# Patient Record
Sex: Male | Born: 1950 | Race: White | Hispanic: No | Marital: Married | Smoking: Current every day smoker
Health system: Southern US, Community
[De-identification: ages and names within clinical notes are randomized; demographics above are authoritative.]

## PROBLEM LIST (undated history)

## (undated) DIAGNOSIS — D126 Benign neoplasm of colon, unspecified: Secondary | ICD-10-CM

## (undated) DIAGNOSIS — J449 Chronic obstructive pulmonary disease, unspecified: Secondary | ICD-10-CM

## (undated) DIAGNOSIS — N529 Male erectile dysfunction, unspecified: Secondary | ICD-10-CM

## (undated) DIAGNOSIS — G459 Transient cerebral ischemic attack, unspecified: Secondary | ICD-10-CM

## (undated) DIAGNOSIS — R202 Paresthesia of skin: Secondary | ICD-10-CM

## (undated) DIAGNOSIS — L409 Psoriasis, unspecified: Secondary | ICD-10-CM

## (undated) DIAGNOSIS — R200 Anesthesia of skin: Secondary | ICD-10-CM

## (undated) DIAGNOSIS — I480 Paroxysmal atrial fibrillation: Secondary | ICD-10-CM

## (undated) HISTORY — DX: Benign neoplasm of colon, unspecified: D12.6

## (undated) HISTORY — DX: Anesthesia of skin: R20.2

## (undated) HISTORY — PX: A FLUTTER ABLATION: SHX5348

## (undated) HISTORY — DX: Paresthesia of skin: R20.0

## (undated) HISTORY — DX: Male erectile dysfunction, unspecified: N52.9

## (undated) HISTORY — PX: TOTAL HIP ARTHROPLASTY: SHX124

## (undated) HISTORY — PX: OTHER SURGICAL HISTORY: SHX169

## (undated) HISTORY — DX: Paroxysmal atrial fibrillation: I48.0

## (undated) HISTORY — DX: Transient cerebral ischemic attack, unspecified: G45.9

## (undated) HISTORY — PX: TONSILLECTOMY: SUR1361

## (undated) HISTORY — DX: Chronic obstructive pulmonary disease, unspecified: J44.9

## (undated) HISTORY — DX: Psoriasis, unspecified: L40.9

---

## 2002-07-13 DIAGNOSIS — G459 Transient cerebral ischemic attack, unspecified: Secondary | ICD-10-CM

## 2002-07-13 HISTORY — DX: Transient cerebral ischemic attack, unspecified: G45.9

## 2005-07-13 HISTORY — PX: TOTAL HIP ARTHROPLASTY: SHX124

## 2007-07-14 LAB — HM COLONOSCOPY

## 2010-06-04 ENCOUNTER — Ambulatory Visit: Payer: Self-pay | Admitting: Cardiology

## 2010-07-01 ENCOUNTER — Ambulatory Visit: Payer: Self-pay | Admitting: Cardiology

## 2010-09-30 ENCOUNTER — Other Ambulatory Visit: Payer: Self-pay | Admitting: *Deleted

## 2010-09-30 DIAGNOSIS — IMO0002 Reserved for concepts with insufficient information to code with codable children: Secondary | ICD-10-CM

## 2010-09-30 MED ORDER — FLECAINIDE ACETATE 50 MG PO TABS: 50.0000 mg | ORAL_TABLET | Freq: Every day | ORAL | Status: DC

## 2010-09-30 MED ORDER — FLECAINIDE ACETATE 50 MG PO TABS: 50.0000 mg | ORAL_TABLET | Freq: Two times a day (BID) | ORAL | Status: DC

## 2010-11-28 ENCOUNTER — Telehealth: Payer: Self-pay | Admitting: Cardiology

## 2010-11-28 NOTE — Telephone Encounter (Signed)
Pt would like RN to set up f/u with Dr. Johney Frame since Dr. Deborah Chalk is retiring.  RN set pt up for a recall for 09/12.  Pt notified of recall.

## 2010-11-28 NOTE — Telephone Encounter (Signed)
Wanted to talk about scheduling with Dr. Johney Frame, received a phone call yesterday asking him to call into our offices to schedule for September. Please call back.

## 2011-03-13 ENCOUNTER — Encounter: Payer: Self-pay | Admitting: Internal Medicine

## 2011-03-18 ENCOUNTER — Ambulatory Visit (INDEPENDENT_AMBULATORY_CARE_PROVIDER_SITE_OTHER): Payer: 59 | Admitting: Internal Medicine

## 2011-03-18 ENCOUNTER — Encounter: Payer: Self-pay | Admitting: Internal Medicine

## 2011-03-18 DIAGNOSIS — Z720 Tobacco use: Secondary | ICD-10-CM

## 2011-03-18 DIAGNOSIS — I4891 Unspecified atrial fibrillation: Secondary | ICD-10-CM

## 2011-03-18 DIAGNOSIS — F17200 Nicotine dependence, unspecified, uncomplicated: Secondary | ICD-10-CM

## 2011-03-18 NOTE — Assessment & Plan Note (Signed)
Cessation advised He is not ready to quit 

## 2011-03-18 NOTE — Patient Instructions (Signed)
Your physician wants you to follow-up in: 1 year with Dr. Allred. You will receive a reminder letter in the mail two months in advance. If you don't receive a letter, please call our office to schedule the follow-up appointment.  Your physician recommends that you continue on your current medications as directed. Please refer to the Current Medication list given to you today.  

## 2011-03-18 NOTE — Assessment & Plan Note (Signed)
He denies episodes of afib since his atrial flutter ablation His CHADSVASC score is 0.  He is now on ASA.  Today, we will stop flecainide.  ETOH avoidance encouraged

## 2011-03-18 NOTE — Progress Notes (Signed)
Lawrence Rivas is a pleasant 60 y.o. WM patient with a h/o atrial flutter and atrial fibrillation who presents today to establish care with me in the absence of Dr Deborah Chalk.  He reports having atrial flutter in 2011 for which he underwent atrial flutter ablation at [facility] [place].  He was felt to have atrial fibrillation resulting from degeneration of atrial flutter.  Following ablation, he has had no further afib.  He reports doing well at this time.  He remains active and walks 3-4 miles per day without difficulty.  Today, he denies symptoms of palpitations, chest pain, shortness of breath, orthopnea, PND, lower extremity edema, dizziness, presyncope, syncope, or neurologic sequela. The patient is tolerating medications without difficulties and is otherwise without complaint today.   Past Medical History  Diagnosis Date  . Paroxysmal a-fib     no recurrence s/p atrial flutter ablation (previously atrial flutter felt to degenerate into afib)  . Numbness and tingling     right arm   . Psoriasis   . Atrial flutter     s/p atrial flutter ablation at [facility] 3/11  . Erectile dysfunction    Past Surgical History  Procedure Date  . Total hip arthroplasty     right  . Tonsillectomy   . A flutter ablation     March 2011 at [facility] by Dr Ruben Reason Dixat    Current Outpatient Prescriptions  Medication Sig Dispense Refill  . aspirin 325 MG tablet Take 325 mg by mouth daily.        Marland Kitchen diltiazem (CARDIZEM CD) 120 MG 24 hr capsule 1 TAB TWICE A DAY      . flecainide (TAMBOCOR) 50 MG tablet Take 1 tablet (50 mg total) by mouth 2 (two) times daily.  60 tablet  6    No Known Allergies  History   Social History  . Marital Status: Married    Spouse Name: N/A    Number of Children: 20  . Years of Education: N/A   Occupational History  . assignment editor     [name]    Social History Main Topics  . Smoking status: Current Everyday Smoker -- 1.0  packs/day    Types: Cigarettes  . Smokeless tobacco: Not on file  . Alcohol Use: Yes     2 drinks of scotch per day  . Drug Use: No  . Sexually Active: Not on file   Other Topics Concern  . Not on file   Social History Narrative   Works as a Youth worker for [employer]    Family History  Problem Relation Age of Onset  . Coronary artery disease Father     ROS- All systems are reviewed and negative except as per the HPI above  Physical Exam: Filed Vitals:   03/18/11 1039  BP: 120/71  Pulse: 65  Height: 6' (1.829 m)  Weight: 196 lb (88.905 kg)    GEN- The patient is well appearing, alert and oriented x 3 today.   Head- normocephalic, atraumatic Eyes-  Sclera clear, conjunctiva pink Ears- hearing intact Oropharynx- clear Neck- supple, no JVP Lymph- no cervical lymphadenopathy Lungs- Clear to ausculation bilaterally, normal work of breathing Heart- Regular rate and rhythm, no murmurs, rubs or gallops, PMI not laterally displaced GI- soft, NT, ND, + BS Extremities- no clubbing, cyanosis, or edema MS- no significant deformity or atrophy Skin- + psoriatic rash on legs Psych- euthymic mood, full affect Neuro- strength and sensation are  intact  EKG today reveals sinus rhythm with PACs, RBBB  Assessment and Plan:

## 2011-06-18 ENCOUNTER — Ambulatory Visit (INDEPENDENT_AMBULATORY_CARE_PROVIDER_SITE_OTHER): Payer: 59 | Admitting: Internal Medicine

## 2011-06-18 ENCOUNTER — Encounter: Payer: Self-pay | Admitting: Internal Medicine

## 2011-06-18 DIAGNOSIS — Z23 Encounter for immunization: Secondary | ICD-10-CM

## 2011-06-18 DIAGNOSIS — Z0000 Encounter for general adult medical examination without abnormal findings: Secondary | ICD-10-CM

## 2011-06-18 DIAGNOSIS — N529 Male erectile dysfunction, unspecified: Secondary | ICD-10-CM | POA: Insufficient documentation

## 2011-06-18 LAB — CBC WITH DIFFERENTIAL/PLATELET
Eosinophils Absolute: 0.3 10*3/uL (ref 0.0–0.7)
HCT: 42.7 % (ref 39.0–52.0)
Lymphs Abs: 2 10*3/uL (ref 0.7–4.0)
MCHC: 34.6 g/dL (ref 30.0–36.0)
MCV: 100.8 fl — ABNORMAL HIGH (ref 78.0–100.0)
Monocytes Absolute: 0.5 10*3/uL (ref 0.1–1.0)
Neutrophils Relative %: 50.2 % (ref 43.0–77.0)
Platelets: 207 10*3/uL (ref 150.0–400.0)
RDW: 13.6 % (ref 11.5–14.6)

## 2011-06-18 LAB — COMPREHENSIVE METABOLIC PANEL
ALT: 22 U/L (ref 0–53)
AST: 24 U/L (ref 0–37)
Albumin: 3.9 g/dL (ref 3.5–5.2)
Alkaline Phosphatase: 51 U/L (ref 39–117)
Glucose, Bld: 84 mg/dL (ref 70–99)
Potassium: 4.9 mEq/L (ref 3.5–5.1)
Sodium: 138 mEq/L (ref 135–145)
Total Bilirubin: 0.5 mg/dL (ref 0.3–1.2)
Total Protein: 6.9 g/dL (ref 6.0–8.3)

## 2011-06-18 LAB — PSA: PSA: 0.21 ng/mL (ref 0.10–4.00)

## 2011-06-18 LAB — LIPID PANEL
Cholesterol: 194 mg/dL (ref 0–200)
LDL Cholesterol: 113 mg/dL — ABNORMAL HIGH (ref 0–99)
Triglycerides: 60 mg/dL (ref 0.0–149.0)

## 2011-06-18 NOTE — Assessment & Plan Note (Addendum)
Long h/o ED and decreased libido. Plan: Check a testosterone level viagra trial, s/e discussed , rec not  to use if he uses flecainide

## 2011-06-18 NOTE — Patient Instructions (Addendum)
Try viagra, use as needed  50 mg first , then 100 mg if needed----> if side effects stop using it and call immediately

## 2011-06-18 NOTE — Progress Notes (Signed)
  Subjective:    Patient ID: Lawrence Rivas, male    DOB: [birth year], 60 y.o.   MRN: [id number]  HPI New patient, needs a physical exam In general doing okay, reports a long history of erectile dysfunction in the last year it has gotten worse the unable to penetrate. Also his libido has decreased tremendously. Denies excessive fatigue, has noticed a loss of muscle mass but at the same time she's not exercising much. As far as a systolic atrial fibrillation, the patient reports that was prescribed to take flecainide only as needed.  Past medical history TIA 2004 Parox. 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  Respiratory: Negative for cough and shortness of breath.   Cardiovascular: Negative for chest pain and leg swelling.  Gastrointestinal: Negative for abdominal pain and blood in stool.  Genitourinary: Negative for dysuria, hematuria and difficulty urinating.  Psychiatric/Behavioral:       No anxiety-depression       Objective:   Physical Exam  Constitutional: He is oriented to person, place, and time. He appears well-developed and well-nourished. No distress.  HENT:  Head: Normocephalic and atraumatic.  Neck: No thyromegaly present.       Normal carotid pulse, no bruit  Cardiovascular: Normal rate, regular rhythm and normal heart sounds.   No murmur heard.      Normal femoral pulse B  Pulmonary/Chest: Effort normal and breath sounds normal. No respiratory distress. He has no wheezes. He  has no rales.  Abdominal: Soft. Bowel sounds are normal. He exhibits no distension. There is no tenderness. There is no rebound and no guarding.  Genitourinary:       Declined DRE  Musculoskeletal: He exhibits no edema.  Lymphadenopathy:    He has no cervical adenopathy.  Neurological: He is alert and oriented to person, place, and time.  Skin: He is not diaphoretic.  Psychiatric: He has a normal mood and affect. His behavior is normal. Judgment and thought content normal.      Assessment & Plan:

## 2011-06-18 NOTE — Assessment & Plan Note (Addendum)
Td > 20 years ago, gave one today Had a flu shot Pneumonia shot today (h/o arrhythmias) Info regards shingles shot provided   Reports a Cscope 2009, elesewhere, "benign polyps" Check a PSA Diet, exercise, tobacco quit -- discussed labs

## 2011-06-19 LAB — TESTOSTERONE, FREE, TOTAL, SHBG
Sex Hormone Binding: 54 nmol/L (ref 13–71)
Testosterone, Free: 75.6 pg/mL (ref 47.0–244.0)
Testosterone-% Free: 1.5 % — ABNORMAL LOW (ref 1.6–2.9)
Testosterone: 500.93 ng/dL (ref 250–890)

## 2011-06-22 ENCOUNTER — Encounter: Payer: Self-pay | Admitting: Internal Medicine

## 2011-06-22 LAB — FOLATE: Folate: 10.4 ng/mL (ref >5.9)

## 2011-06-22 LAB — VITAMIN B12: Vitamin B-12: 308 pg/mL (ref 211–911)

## 2011-09-03 ENCOUNTER — Other Ambulatory Visit: Payer: Self-pay | Admitting: *Deleted

## 2011-09-03 MED ORDER — DILTIAZEM HCL ER COATED BEADS 120 MG PO CP24: 120.0000 mg | ORAL_CAPSULE | Freq: Two times a day (BID) | ORAL | Status: DC

## 2012-03-28 ENCOUNTER — Encounter: Payer: Self-pay | Admitting: Internal Medicine

## 2012-03-28 ENCOUNTER — Ambulatory Visit (INDEPENDENT_AMBULATORY_CARE_PROVIDER_SITE_OTHER): Payer: 59 | Admitting: Internal Medicine

## 2012-03-28 VITALS — BP 114/62 | HR 75 | Ht 73.0 in | Wt 195.0 lb

## 2012-03-28 DIAGNOSIS — Z720 Tobacco use: Secondary | ICD-10-CM

## 2012-03-28 DIAGNOSIS — F17200 Nicotine dependence, unspecified, uncomplicated: Secondary | ICD-10-CM

## 2012-03-28 DIAGNOSIS — I4891 Unspecified atrial fibrillation: Secondary | ICD-10-CM

## 2012-03-28 MED ORDER — FLECAINIDE ACETATE 100 MG PO TABS: ORAL_TABLET | ORAL | Status: DC

## 2012-03-28 NOTE — Patient Instructions (Signed)
Your physician wants you to follow-up in: 12 months with Dr Jacquiline Doe will receive a reminder letter in the mail two months in advance. If you don't receive a letter, please call our office to schedule the follow-up appointment.   Your physician has recommended you make the following change in your medication:  1) Take Flecainide 100mg  ----take 2 tablets as needed for afib give it 12 hours to convert  If you stay in afib call me at [phone]

## 2012-03-28 NOTE — Assessment & Plan Note (Signed)
Cessation advised and discussed at length He is contemplating cessation  Lipids 12/12 reviewed No changes today No ischemic symptoms and he continues to walk 3-4 miles per day without difficulty or symptoms of PVD

## 2012-03-28 NOTE — Progress Notes (Signed)
PCP: Willow Ora, MD  Lawrence Rivas is a 61 y.o. male who presents today for routine electrophysiology followup.  Since last being seen in our clinic, the patient reports doing very well.  He denies any sustained arrhythmias. Today, he denies symptoms of palpitations, chest pain, shortness of breath,  lower extremity edema, dizziness, presyncope, or syncope.  The patient is otherwise without complaint today.   Past Medical History  Diagnosis Date  . Paroxysmal a-fib     no recurrence s/p atrial flutter ablation (previously atrial flutter felt to degenerate into afib)  . Numbness and tingling     right arm   . Psoriasis   . Atrial flutter     s/p atrial flutter ablation at [facility] [place] 3/11  . Erectile dysfunction    Past Surgical History  Procedure Date  . Total hip arthroplasty     right  . Tonsillectomy   . A flutter ablation     March 2011 at [facility] by Dr Ruben Reason Dixat    Current Outpatient Prescriptions  Medication Sig Dispense Refill  . aspirin 325 MG tablet Take 325 mg by mouth daily.        . clobetasol ointment (TEMOVATE) 0.05 % Apply 1 application topically 2 (two) times daily.        Marland Kitchen diltiazem (CARDIZEM CD) 120 MG 24 hr capsule Take 1 capsule (120 mg total) by mouth 2 (two) times daily. 1 TAB TWICE A DAY  120 capsule  6    Physical Exam: Filed Vitals:   03/28/12 1011  BP: 114/62  Pulse: 75  Height: 6\' 1"  (1.854 m)  Weight: 195 lb (88.451 kg)  SpO2: 97%    GEN- The patient is well appearing, alert and oriented x 3 today.   Head- normocephalic, atraumatic Eyes-  Sclera clear, conjunctiva pink Ears- hearing intact Oropharynx- clear Lungs- Clear to ausculation bilaterally, normal work of breathing Heart- Regular rate and rhythm, no murmurs, rubs or gallops, PMI not laterally displaced GI- soft, NT, ND, + BS Extremities- no clubbing, cyanosis, or edema  ekg today reveals sinus rhythm with short PR and RBBB  Assessment and  Plan:

## 2012-03-28 NOTE — Assessment & Plan Note (Signed)
Maintaining sinus rhythm off of AAD He will stop diltiazem today and take flecainide 200mg  prn as a "pill in the pocket approach" He should contact me if further afib occurs CHADS2 score is 0.  He is therefore not on anticoagulation

## 2012-10-24 ENCOUNTER — Ambulatory Visit (INDEPENDENT_AMBULATORY_CARE_PROVIDER_SITE_OTHER): Payer: BC Managed Care – PPO | Admitting: Internal Medicine

## 2012-10-24 VITALS — BP 128/84 | HR 74 | Temp 98.1°F | Wt 206.0 lb

## 2012-10-24 DIAGNOSIS — J309 Allergic rhinitis, unspecified: Secondary | ICD-10-CM

## 2012-10-24 DIAGNOSIS — I4891 Unspecified atrial fibrillation: Secondary | ICD-10-CM

## 2012-10-24 MED ORDER — FLUTICASONE PROPIONATE 50 MCG/ACT NA SUSP: 2.0000 | Freq: Every day | NASAL | Status: DC

## 2012-10-24 MED ORDER — HYDROCODONE-HOMATROPINE 5-1.5 MG/5ML PO SYRP: 5.0000 mL | ORAL_SOLUTION | Freq: Every evening | ORAL | Status: DC | PRN

## 2012-10-24 NOTE — Assessment & Plan Note (Signed)
Reports he is asymptomatic, off Cardizem. On flecanaide when necessary, has not taken in a while

## 2012-10-24 NOTE — Assessment & Plan Note (Addendum)
Sx likely d/t allergies, less likely an atypical infex , see instructions  If no better in few days, he will call ---> ? Z-Pak

## 2012-10-24 NOTE — Patient Instructions (Addendum)
For cough, take Mucinex DM twice a day as needed If cough at night, use hydrocodone as prescribed   For congestion use  flonase until you feel better Lawrence Rivas claritin OTC Call if no better in few days Call anytime if the symptoms are severe --- Schedule a physical at your convenience

## 2012-10-24 NOTE — Progress Notes (Signed)
  Subjective:    Patient ID: Lawrence Rivas, male    DOB: 1951-07-12, 62 y.o.   MRN: [id number]  HPI Acute visit 2 weeks ago, he was exposed to polen according to the patient, immediately developed a head congestion, nasal discharge, postnasal dripping. He started OTC and symptoms improved. A week ago he started with cough, some sore throat chest congestion. Cough is severe at night, can't sleep well.  Past Medical History  Diagnosis Date  . Paroxysmal a-fib     no recurrence s/p atrial flutter ablation (previously atrial flutter felt to degenerate into afib)  . Numbness and tingling     right arm   . Psoriasis   . Erectile dysfunction   . TIA (transient ischemic attack) 2004   Past Surgical History  Procedure Laterality Date  . Total hip arthroplasty      right  . Tonsillectomy    . 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 Occasionally produces white to clear sputum and nasal discharge. No fever chills. No wheezing.     Objective:   Physical Exam BP 128/84  Pulse 74  Temp(Src) 98.1 F (36.7 C) (Oral)  Wt 206 lb (93.441 kg)  BMI 27.18 kg/m2  SpO2 95%  General -- alert, well-developed, No apparent distress .   HEENT -- TMs normal, throat w/o redness, face symmetric and not tender to palpation, Nose is slightly congested Lungs -- normal respiratory effort, no intercostal retractions, no accessory muscle use, and normal breath sounds.   Heart-- normal rate, regular rhythm, no murmur, and no gallop.   Extremities-- no pretibial edema  bilaterally  Neurologic-- alert & oriented X3 and strength normal in all extremities. Psych-- Cognition and judgment appear intact. Alert and cooperative with normal attention span and concentration.  not anxious appearing and not depressed appearing.      Assessment & Plan:

## 2012-10-25 ENCOUNTER — Encounter: Payer: Self-pay | Admitting: Internal Medicine

## 2013-04-03 ENCOUNTER — Encounter: Payer: Self-pay | Admitting: Internal Medicine

## 2013-04-03 ENCOUNTER — Ambulatory Visit (INDEPENDENT_AMBULATORY_CARE_PROVIDER_SITE_OTHER): Payer: BC Managed Care – PPO | Admitting: Internal Medicine

## 2013-04-03 VITALS — BP 120/78 | HR 66 | Ht 73.0 in | Wt 200.0 lb

## 2013-04-03 DIAGNOSIS — I4891 Unspecified atrial fibrillation: Secondary | ICD-10-CM

## 2013-04-03 DIAGNOSIS — Z720 Tobacco use: Secondary | ICD-10-CM

## 2013-04-03 DIAGNOSIS — F17200 Nicotine dependence, unspecified, uncomplicated: Secondary | ICD-10-CM

## 2013-04-03 MED ORDER — DILTIAZEM HCL 30 MG PO TABS: ORAL_TABLET | ORAL | Status: DC

## 2013-04-03 MED ORDER — FLECAINIDE ACETATE 100 MG PO TABS: ORAL_TABLET | ORAL | Status: DC

## 2013-04-03 NOTE — Patient Instructions (Addendum)
Your physician wants you to follow-up in: 12 months with Dr Jacquiline Doe will receive a reminder letter in the mail two months in advance. If you don't receive a letter, please call our office to schedule the follow-up appointment.  Your physician has recommended you make the following change in your medication:  1) Start  Cardizem 30mg  take 1 every 6 hours as needed for palpitations

## 2013-04-03 NOTE — Progress Notes (Signed)
PCP: Willow Ora, MD  Lawrence Rivas is a 62 y.o. male who presents today for routine electrophysiology followup.  Since last being seen in our clinic, the patient reports doing very well.  We discussed his mothers recent death at length today.  They were very close.  He has had 3-4 episodes of chest "fluttering"/ uneasiness for which he has taken flecainide over the past year.  These episodes occurred at rest and resolved within 15 minutes.  He denies any exertional symptoms.  He remains active [place] Today, he denies symptoms of  chest pain, shortness of breath,  lower extremity edema, dizziness, presyncope, or syncope.  The patient is otherwise without complaint today.   Past Medical History  Diagnosis Date  . Paroxysmal a-fib     no recurrence s/p atrial flutter ablation (previously atrial flutter felt to degenerate into afib)  . Numbness and tingling     right arm   . Psoriasis   . Erectile dysfunction   . TIA (transient ischemic attack) 2004   Past Surgical History  Procedure Laterality Date  . Total hip arthroplasty      right  . Tonsillectomy    . A flutter ablation      March 2011 at [facility] by Dr Ruben Reason Dixat    Current Outpatient Prescriptions  Medication Sig Dispense Refill  . aspirin 325 MG tablet Take 325 mg by mouth daily.        . clobetasol ointment (TEMOVATE) 0.05 % Apply 1 application topically 2 (two) times daily.        . flecainide (TAMBOCOR) 100 MG tablet Take 2 tablets as needed for afib  30 tablet  1  . fluticasone (FLONASE) 50 MCG/ACT nasal spray Place 2 sprays into the nose daily.  16 g  6  . HYDROcodone-homatropine (HYCODAN) 5-1.5 MG/5ML syrup Take 5 mLs by mouth at bedtime as needed for cough.  120 mL  0   No current facility-administered medications for this visit.    Physical Exam: Filed Vitals:   04/03/13 1008  BP: 120/78  Pulse: 66  Height: 6\' 1"  (1.854 m)  Weight: 200 lb  (90.719 kg)    GEN- The patient is well appearing, alert and oriented x 3 today.   Head- normocephalic, atraumatic Eyes-  Sclera clear, conjunctiva pink Ears- hearing intact Oropharynx- clear Lungs- Clear to ausculation bilaterally, normal work of breathing Heart- Regular rate and rhythm, no murmurs, rubs or gallops, PMI not laterally displaced GI- soft, NT, ND, + BS Extremities- no clubbing, cyanosis, or edema  ekg today reveals sinus rhythm with RBBB  Assessment and Plan:  1. afib Well controlled I think I have encouraged him to come to either my office or Dr Drue Novel' office should he have any episode that he thinks may be afib to better characterize this.   His episodes are too infrequent for event monitor placement.  We discussed implantable loop recorder placement which he wants to avoid. Continue flecainide as needed (pill in pocket) As needed cardizem  Return in 1 year Smoking cessation again encouraged

## 2013-05-10 ENCOUNTER — Ambulatory Visit (INDEPENDENT_AMBULATORY_CARE_PROVIDER_SITE_OTHER): Payer: BC Managed Care – PPO | Admitting: Internal Medicine

## 2013-05-10 ENCOUNTER — Encounter: Payer: Self-pay | Admitting: Internal Medicine

## 2013-05-10 VITALS — BP 142/82 | HR 95 | Temp 98.4°F | Wt 198.0 lb

## 2013-05-10 DIAGNOSIS — J209 Acute bronchitis, unspecified: Secondary | ICD-10-CM

## 2013-05-10 MED ORDER — HYDROCODONE-HOMATROPINE 5-1.5 MG/5ML PO SYRP: 5.0000 mL | ORAL_SOLUTION | Freq: Every evening | ORAL | Status: DC | PRN

## 2013-05-10 MED ORDER — DOXYCYCLINE HYCLATE 100 MG PO TABS: 100.0000 mg | ORAL_TABLET | Freq: Two times a day (BID) | ORAL | Status: DC

## 2013-05-10 NOTE — Patient Instructions (Signed)
Rest, fluids , tylenol For cough, take Mucinex DM twice a day as needed  For persistent , nocturnal cough, use hydrocodone Take the antibiotic as prescribed  (doxycycline) Call if no better in few days Call anytime if the symptoms are severe --- Get a flu shot once are better Reschedule your physical for 2 or 3 weeks from today

## 2013-05-10 NOTE — Progress Notes (Signed)
  Subjective:    Patient ID: Lawrence Rivas, male    DOB: September 25, 1950, 62 y.o.   MRN: [id number]  HPI Acute visit Symptoms started 2 weeks ago with sore throat, head congestion, then developed cough often times productive, sometimes dry. Sputum and nasal discharge is dark greenish Last week he had fever twice, no fever this week. He is using A nasal spray and OTCs with mild help.  Past Medical History  Diagnosis Date  . Paroxysmal a-fib     no recurrence s/p atrial flutter ablation (previously atrial flutter felt to degenerate into afib)  . Numbness and tingling     right arm   . Psoriasis   . Erectile dysfunction   . TIA (transient ischemic attack) 2004   Past Surgical History  Procedure Laterality Date  . Total hip arthroplasty      right  . Tonsillectomy    . A flutter ablation      March 2011 at [facility] by Dr Ruben Reason Dixat   History   Social History  . Marital Status: Married    Spouse Name: N/A    Number of Children: 1  . Years of Education: N/A   Occupational History  . 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     Social History Main Topics  . Smoking status: Current Every Day Smoker -- 1.00 packs/day    Types: Cigarettes  . Smokeless tobacco: Not on file     Comment: he will try to quit  . Alcohol Use: Yes     Comment: 2 drinks of scotch per day  . Drug Use: No  . Sexual Activity: Not on file   Other Topics Concern  . Not on file   Social History Narrative   Works as a Youth worker for [employer]    Review of Systems No chest pain or shortness or breath, no hemoptysis but some wheezing. Still smoking but here lately has been smoking less. No recent trips outside [place].     Objective:   Physical Exam BP 142/82  Pulse 95  Temp(Src) 98.4 F (36.9 C)  Wt 198 lb (89.812 kg)  BMI 26.13 kg/m2  SpO2 96% General -- alert, well-developed, NAD.    HEENT-- Not pale. TM R nl, L Obscured by wax, throat symmetric, no redness or  discharge. Face symmetric, sinuses not tender to palpation. Nose quite  congested.  Lungs -- normal respiratory effort, no intercostal retractions, no accessory muscle use, and few rhonchi that clear with cough. Heart-- normal rate, regular rhythm, no murmur.   Extremities-- no pretibial edema bilaterally  Neurologic--  alert & oriented X3. Speech normal, gait normal, strength normal in all extremities.  Psych-- Cognition and judgment appear intact. Cooperative with normal attention span and concentration. No anxious appearing , no depressed appearing.      Assessment & Plan:  Bronchitis, Symptoms consistent with bronchitis, patient is nontoxic, had fever last week but no temperature today. Plan: see  instructions

## 2013-05-15 ENCOUNTER — Encounter: Payer: BC Managed Care – PPO | Admitting: Internal Medicine

## 2013-05-18 ENCOUNTER — Encounter: Payer: Self-pay | Admitting: Internal Medicine

## 2013-05-18 ENCOUNTER — Ambulatory Visit (INDEPENDENT_AMBULATORY_CARE_PROVIDER_SITE_OTHER): Payer: BC Managed Care – PPO | Admitting: Internal Medicine

## 2013-05-18 ENCOUNTER — Ambulatory Visit (HOSPITAL_BASED_OUTPATIENT_CLINIC_OR_DEPARTMENT_OTHER)
Admission: RE | Admit: 2013-05-18 | Discharge: 2013-05-18 | Disposition: A | Discharge status: Home / Self Care | Payer: BC Managed Care – PPO | Source: Ambulatory Visit | Attending: Internal Medicine | Admitting: Internal Medicine

## 2013-05-18 VITALS — BP 126/79 | HR 83 | Temp 98.6°F | Resp 14

## 2013-05-18 DIAGNOSIS — R059 Cough, unspecified: Secondary | ICD-10-CM | POA: Insufficient documentation

## 2013-05-18 DIAGNOSIS — R05 Cough: Secondary | ICD-10-CM | POA: Insufficient documentation

## 2013-05-18 DIAGNOSIS — J209 Acute bronchitis, unspecified: Secondary | ICD-10-CM

## 2013-05-18 DIAGNOSIS — R0789 Other chest pain: Secondary | ICD-10-CM

## 2013-05-18 DIAGNOSIS — R079 Chest pain, unspecified: Secondary | ICD-10-CM | POA: Insufficient documentation

## 2013-05-18 DIAGNOSIS — R0781 Pleurodynia: Secondary | ICD-10-CM

## 2013-05-18 DIAGNOSIS — R071 Chest pain on breathing: Secondary | ICD-10-CM

## 2013-05-18 MED ORDER — DOXYCYCLINE HYCLATE 100 MG PO TABS: 100.0000 mg | ORAL_TABLET | Freq: Two times a day (BID) | ORAL | Status: DC

## 2013-05-18 MED ORDER — PREDNISONE 20 MG PO TABS: 20.0000 mg | ORAL_TABLET | Freq: Two times a day (BID) | ORAL | Status: DC

## 2013-05-18 MED ORDER — TRAMADOL HCL 50 MG PO TABS: 50.0000 mg | ORAL_TABLET | Freq: Four times a day (QID) | ORAL | Status: DC | PRN

## 2013-05-18 NOTE — Patient Instructions (Addendum)
Additional studies are necessary  to optimally evaluate your symptoms ; please complete these @  [facility] , [address]  Provide support for the rib cage as we discussed if you are coughing

## 2013-05-18 NOTE — Progress Notes (Signed)
  Subjective:    Patient ID: Lawrence Rivas, male    DOB: 1950/12/07, 62 y.o.   MRN: [id number]  HPI   Symptoms began 3 weeks ago as a tickle in his throat associated with postnasal drainage and some sneezing. This progressed to chest congestion with the production of cloudy, thick sputum. This was associated with slight wheezing but no dyspnea.  10 days ago he had significant fever, chills, and profuse diaphoresis.  He was seen and placed on doxycycline and narcotic cough syrup with some improvement. He states he is actually 60-80% improved. He continues to have paroxysms of cough with sputum production.  Since 05/17/13 he's had sharp left inferior thoracic chest pain with deep breathing or coughing. No hemoptysis.  Typically does smoke a pack a day; he has decreased her tobacco consumption recently. He has no history of asthma. He denies a "smoker's cough ". He is not on ACE inhibitor.     Review of Systems  He is not having itchy, watery eyes. Postnasal drainage is not significant at this time.He has no rhinosinusitis symptoms of frontal headache, facial pain, nasal purulence, dental pain, sore throat, or otic pain, or otic discharge.  He denies any significant reflux symptoms.       Objective:   Physical Exam General appearance:good health ;well nourished; no acute distress or increased work of breathing is present.  No  lymphadenopathy about the head, neck, or axilla noted.   Eyes: No conjunctival inflammation or lid edema is present. There is no scleral icterus.  Ears:  External ear exam shows no significant lesions or deformities.  Otoscopic examination reveals wax on L ; R TM normal  Nose:  External nasal examination shows no deformity or inflammation. Nasal mucosa are pink and moist without lesions or exudates. No septal dislocation or deviation.No obstruction to airflow.   Oral exam: Dental hygiene is good; lips and gums are healthy appearing.There is no oropharyngeal  erythema or exudate noted. Tongue nicotine stained  Neck:  No deformities, masses, or tenderness noted.      Heart:  Normal rate and regular rhythm. S1 and S2 normal without gallop, murmur, click, rub or other extra sounds.   Lungs:Chest clear to auscultation; no wheezes, rhonchi,rales ,or rubs present.No increased work of breathing.  Tenderness to palpation left inferior thorax. There is no rub on the area pain. Chest is surprisingly clear. Paroxysmal cough productive of grayish thick sputum  Extremities:  No cyanosis, edema, or clubbing  noted . Homans sign negative bilaterally   Skin: Warm & dry          Assessment & Plan:  #1 protracted bronchitis with pleuritic chest pain. R/O rib fracture See orders

## 2013-06-02 ENCOUNTER — Telehealth: Payer: Self-pay

## 2013-06-02 NOTE — Telephone Encounter (Addendum)
LM with spouse  Medication List and allergies: reviewed and updated  90 day supply/mail order: na Local prescriptions: [facility] [address]  Immunizations due: flu and shingles  A/P:   No changes to FH or PSH PSA--06/2011--0.21 Health Maintenance  Topic Date Due  . Zostavax  04/30/2010  . Influenza Vaccine  02/10/2013  . Colonoscopy  07/13/2017  . Tetanus/tdap  06/17/2021   To Discuss with Provider: Vaccines due flu/shingles/PNA Still has lingering effects of bronchitis (mucus, doesn't feel well)

## 2013-06-05 ENCOUNTER — Ambulatory Visit (INDEPENDENT_AMBULATORY_CARE_PROVIDER_SITE_OTHER): Payer: BC Managed Care – PPO | Admitting: Internal Medicine

## 2013-06-05 ENCOUNTER — Encounter: Payer: Self-pay | Admitting: Internal Medicine

## 2013-06-05 VITALS — BP 158/84 | HR 75 | Temp 98.3°F | Ht 73.0 in | Wt 199.0 lb

## 2013-06-05 DIAGNOSIS — N529 Male erectile dysfunction, unspecified: Secondary | ICD-10-CM

## 2013-06-05 DIAGNOSIS — Z23 Encounter for immunization: Secondary | ICD-10-CM

## 2013-06-05 DIAGNOSIS — Z2911 Encounter for prophylactic immunotherapy for respiratory syncytial virus (RSV): Secondary | ICD-10-CM

## 2013-06-05 DIAGNOSIS — Z0000 Encounter for general adult medical examination without abnormal findings: Secondary | ICD-10-CM

## 2013-06-05 DIAGNOSIS — Z720 Tobacco use: Secondary | ICD-10-CM

## 2013-06-05 LAB — COMPREHENSIVE METABOLIC PANEL
ALT: 27 U/L (ref 0–53)
AST: 25 U/L (ref 0–37)
Alkaline Phosphatase: 55 U/L (ref 39–117)
CO2: 29 mEq/L (ref 19–32)
Creatinine, Ser: 0.7 mg/dL (ref 0.4–1.5)
GFR: 123.05 mL/min (ref >60.00)
Total Bilirubin: 0.6 mg/dL (ref 0.3–1.2)
Total Protein: 6.7 g/dL (ref 6.0–8.3)

## 2013-06-05 LAB — CBC WITH DIFFERENTIAL/PLATELET
Basophils Absolute: 0 10*3/uL (ref 0.0–0.1)
Basophils Relative: 0.8 % (ref 0.0–3.0)
Eosinophils Absolute: 0.1 10*3/uL (ref 0.0–0.7)
HCT: 43.8 % (ref 39.0–52.0)
Hemoglobin: 14.8 g/dL (ref 13.0–17.0)
Lymphs Abs: 1.8 10*3/uL (ref 0.7–4.0)
MCHC: 33.9 g/dL (ref 30.0–36.0)
MCV: 99.3 fl (ref 78.0–100.0)
Monocytes Absolute: 0.6 10*3/uL (ref 0.1–1.0)
Monocytes Relative: 9.9 % (ref 3.0–12.0)
Neutro Abs: 3.3 10*3/uL (ref 1.4–7.7)
Platelets: 204 10*3/uL (ref 150.0–400.0)
RBC: 4.41 Mil/uL (ref 4.22–5.81)
RDW: 14.3 % (ref 11.5–14.6)

## 2013-06-05 LAB — LIPID PANEL
HDL: 74.4 mg/dL (ref >39.00)
Total CHOL/HDL Ratio: 2
VLDL: 10.6 mg/dL (ref 0.0–40.0)

## 2013-06-05 LAB — TSH: TSH: 1.61 u[IU]/mL (ref 0.35–5.50)

## 2013-06-05 LAB — PSA: PSA: 0.35 ng/mL (ref 0.10–4.00)

## 2013-06-05 MED ORDER — TADALAFIL 20 MG PO TABS: 10.0000 mg | ORAL_TABLET | ORAL | Status: DC | PRN

## 2013-06-05 NOTE — Assessment & Plan Note (Signed)
Td 2012 flu shot today Pneumonia shot 2012 shingles shot today  Reports a Cscope 2009, elesewhere, "benign polyps", next? Was done in [place], rec to get records, iFOB for now  Check a PSA Diet, exercise and tobacco quit -- discussed labs Bp slt elevated today but usually ok

## 2013-06-05 NOTE — Assessment & Plan Note (Addendum)
Request samples, tried viagra before w/o s/e but it didn't help much Plan-- samples of cialis 20, #3 tabs Watch for s/e Living on vacation soon, I rec to take if before his  trip to be sure he does not have s/e from it

## 2013-06-05 NOTE — Progress Notes (Signed)
Subjective:    Patient ID: Lawrence Rivas, male    DOB: [birth year], 62 y.o.   MRN: [id number]  HPI CPX  Past Medical History  Diagnosis Date  . Paroxysmal a-fib     no recurrence s/p atrial flutter ablation (previously atrial flutter felt to degenerate into afib)  . Numbness and tingling     right arm   . Psoriasis   . Erectile dysfunction   . TIA (transient ischemic attack) 2004   Past Surgical History  Procedure Laterality Date  . Total hip arthroplasty      right  . Tonsillectomy    . A flutter ablation      March 2011 at [facility] by Dr Ruben Reason Dixat   History   Social History  . Marital Status: Married    Spouse Name: N/A    Number of Children: 1  . Years of Education: N/A   Occupational History  . 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     Social History Main Topics  . Smoking status: Current Every Day Smoker -- 1.00 packs/day    Types: Cigarettes  . Smokeless tobacco: Never Used     Comment: he will try to quit  . Alcohol Use: Yes     Comment: 2 drinks of scotch per day  . Drug Use: No  . Sexual Activity: Not on file   Other Topics Concern  . Not on file   Social History Narrative       Family History  Problem Relation Age of Onset  . Coronary artery disease Father     dx age 20, deceased  . Diabetes Other     GM  . Colon cancer Neg Hx   . Prostate cancer Neg Hx   . Stroke Mother     in her 73-90s    Review of Systems Diet-- healthy got the most part Exercise-- walks weather permits No  CP, SOB, lower extremity edema Denies  nausea, vomiting diarrhea Denies  blood in the stools No GERD  Sx. No dysphagia or odynophagia Recovering from bronchitis, + cough in AM w/ some sputum production (-)hemoptysis No dysuria, gross hematuria, difficulty urinating   No anxiety, depression     Objective:   Physical Exam BP 158/84  Pulse 75  Temp(Src) 98.3 F (36.8 C)  Ht 6\' 1"  (1.854 m)  Wt 199 lb (90.266 kg)  BMI 26.26 kg/m2   SpO2 97% General -- alert, well-developed, NAD.  Neck --no thyromegaly , no LAD  Lungs -- normal respiratory effort, no intercostal retractions, no accessory muscle use, and normal breath sounds.  Heart-- normal rate, regular rhythm, no murmur.  Abdomen-- Not distended, good bowel sounds,soft, non-tender. Rectal-- No external abnormalities noted. Normal sphincter tone. No rectal masses or tenderness. No stool found Prostate--Prostate gland firm and smooth, no enlargement, nodularity, tenderness, mass, asymmetry or induration. Extremities-- no pretibial edema bilaterally  Neurologic--  alert & oriented X3. Speech normal, gait normal, strength normal in all extremities.  Psych-- Cognition and judgment appear intact. Cooperative with normal attention span and concentration. No anxious appearing , no depressed appearing.     Assessment & Plan:   addendum-- ~ 1 year ago noted a "bump" at the forehead, no sx; exam showed a soft , flat 2x1 cm mass , mobile , not attached to deeper structures at the L forehead, likely a lipoma . Told pt only way to be 100% sure that is a lipoma  is by excision, we elected to wait and see, if area changes or keep growing to let me know

## 2013-06-05 NOTE — Progress Notes (Signed)
Pre visit review using our clinic review tool, if applicable. No additional management support is needed unless otherwise documented below in the visit note. 

## 2013-06-05 NOTE — Assessment & Plan Note (Signed)
Quit discussed, rec to see dentist for oral cancer screeningn

## 2013-06-05 NOTE — Patient Instructions (Signed)
Get your blood work before you leave  Next visit in 1 year for a physical exam. Fasting Please make an appointment    If you need more information about quitting tobacco, the American Heart Association is a great resource online at:  Mormon101.pl   Check the  blood pressure monthly be sure it is between 110/60 and 140/85. Ideal blood pressure is 120/80. If it is consistently higher or lower, let me know   Smoking Cessation Quitting smoking is important to your health and has many advantages. However, it is not always easy to quit since nicotine is a very addictive drug. Often times, people try 3 times or more before being able to quit. This document explains the best ways for you to prepare to quit smoking. Quitting takes hard work and a lot of effort, but you can do it. ADVANTAGES OF QUITTING SMOKING  You will live longer, feel better, and live better.  Your body will feel the impact of quitting smoking almost immediately.  Within 20 minutes, blood pressure decreases. Your pulse returns to its normal level.  After 8 hours, carbon monoxide levels in the blood return to normal. Your oxygen level increases.  After 24 hours, the chance of having a heart attack starts to decrease. Your breath, hair, and body stop smelling like smoke.  After 48 hours, damaged nerve endings begin to recover. Your sense of taste and smell improve.  After 72 hours, the body is virtually free of nicotine. Your bronchial tubes relax and breathing becomes easier.  After 2 to 12 weeks, lungs can hold more air. Exercise becomes easier and circulation improves.  The risk of having a heart attack, stroke, cancer, or lung disease is greatly reduced.  After 1 year, the risk of coronary heart disease is cut in half.  After 5 years, the risk of stroke falls to the same as a nonsmoker.  After 10 years, the risk of lung cancer is cut in half and the risk of other cancers decreases  significantly.  After 15 years, the risk of coronary heart disease drops, usually to the level of a nonsmoker.  If you are pregnant, quitting smoking will improve your chances of having a healthy baby.  The people you live with, especially any children, will be healthier.  You will have extra money to spend on things other than cigarettes. QUESTIONS TO THINK ABOUT BEFORE ATTEMPTING TO QUIT You may want to talk about your answers with your caregiver.  Why do you want to quit?  If you tried to quit in the past, what helped and what did not?  What will be the most difficult situations for you after you quit? How will you plan to handle them?  Who can help you through the tough times? Your family? Friends? A caregiver?  What pleasures do you get from smoking? What ways can you still get pleasure if you quit? Here are some questions to ask your caregiver:  How can you help me to be successful at quitting?  What medicine do you think would be best for me and how should I take it?  What should I do if I need more help?  What is smoking withdrawal like? How can I get information on withdrawal? GET READY  Set a quit date.  Change your environment by getting rid of all cigarettes, ashtrays, matches, and lighters in your home, car, or work. Do not let people smoke in your home.  Review your past attempts to quit. Think  about what worked and what did not. GET SUPPORT AND ENCOURAGEMENT You have a better chance of being successful if you have help. You can get support in many ways.  Tell your family, friends, and co-workers that you are going to quit and need their support. Ask them not to smoke around you.  Get individual, group, or telephone counseling and support. Programs are available at [employer] and health centers. Call your local health department for information about programs in your area.  Spiritual beliefs and practices may help some smokers quit.  Download a "quit  meter" on your computer to keep track of quit statistics, such as how long you have gone without smoking, cigarettes not smoked, and money saved.  Get a self-help book about quitting smoking and staying off of tobacco. LEARN NEW SKILLS AND BEHAVIORS  Distract yourself from urges to smoke. Talk to someone, go for a walk, or occupy your time with a task.  Change your normal routine. Take a different route to work. Drink tea instead of coffee. Eat breakfast in a different place.  Reduce your stress. Take a hot bath, exercise, or read a book.  Plan something enjoyable to do every day. Reward yourself for not smoking.  Explore interactive web-based programs that specialize in helping you quit. GET MEDICINE AND USE IT CORRECTLY Medicines can help you stop smoking and decrease the urge to smoke. Combining medicine with the above behavioral methods and support can greatly increase your chances of successfully quitting smoking.  Nicotine replacement therapy helps deliver nicotine to your body without the negative effects and risks of smoking. Nicotine replacement therapy includes nicotine gum, lozenges, inhalers, nasal sprays, and skin patches. Some may be available over-the-counter and others require a prescription.  Antidepressant medicine helps people abstain from smoking, but how this works is unknown. This medicine is available by prescription.  Nicotinic receptor partial agonist medicine simulates the effect of nicotine in your brain. This medicine is available by prescription. Ask your caregiver for advice about which medicines to use and how to use them based on your health history. Your caregiver will tell you what side effects to look out for if you choose to be on a medicine or therapy. Carefully read the information on the package. Do not use any other product containing nicotine while using a nicotine replacement product.  RELAPSE OR DIFFICULT SITUATIONS Most relapses occur within the  first 3 months after quitting. Do not be discouraged if you start smoking again. Remember, most people try several times before finally quitting. You may have symptoms of withdrawal because your body is used to nicotine. You may crave cigarettes, be irritable, feel very hungry, cough often, get headaches, or have difficulty concentrating. The withdrawal symptoms are only temporary. They are strongest when you first quit, but they will go away within 10 14 days. To reduce the chances of relapse, try to:  Avoid drinking alcohol. Drinking lowers your chances of successfully quitting.  Reduce the amount of caffeine you consume. Once you quit smoking, the amount of caffeine in your body increases and can give you symptoms, such as a rapid heartbeat, sweating, and anxiety.  Avoid smokers because they can make you want to smoke.  Do not let weight gain distract you. Many smokers will gain weight when they quit, usually less than 10 pounds. Eat a healthy diet and stay active. You can always lose the weight gained after you quit.  Find ways to improve your mood other than smoking. FOR  MORE INFORMATION  www.smokefree.gov  Document Released: 06/23/2001 Document Revised: 12/29/2011 Document Reviewed: 10/08/2011 [facility] Patient Information 2014 [place], [place].

## 2013-07-03 ENCOUNTER — Other Ambulatory Visit: Payer: BC Managed Care – PPO

## 2013-07-04 ENCOUNTER — Other Ambulatory Visit: Payer: BC Managed Care – PPO

## 2013-07-04 LAB — FECAL OCCULT BLOOD, IMMUNOCHEMICAL: Fecal Occult Bld: NEGATIVE

## 2013-07-04 NOTE — Addendum Note (Signed)
Addended by: Silvio Pate D on: 07/04/2013 11:48 AM   Modules accepted: Orders

## 2013-09-05 ENCOUNTER — Ambulatory Visit (INDEPENDENT_AMBULATORY_CARE_PROVIDER_SITE_OTHER): Payer: BC Managed Care – PPO | Admitting: Internal Medicine

## 2013-09-05 ENCOUNTER — Encounter: Payer: Self-pay | Admitting: Internal Medicine

## 2013-09-05 VITALS — BP 122/76 | HR 104 | Temp 99.8°F | Wt 199.0 lb

## 2013-09-05 DIAGNOSIS — J09X2 Influenza due to identified novel influenza A virus with other respiratory manifestations: Secondary | ICD-10-CM

## 2013-09-05 DIAGNOSIS — J111 Influenza due to unidentified influenza virus with other respiratory manifestations: Secondary | ICD-10-CM

## 2013-09-05 LAB — POCT INFLUENZA A/B
INFLUENZA B, POC: POSITIVE
Influenza A, POC: POSITIVE

## 2013-09-05 MED ORDER — OSELTAMIVIR PHOSPHATE 75 MG PO CAPS: 75.0000 mg | ORAL_CAPSULE | Freq: Two times a day (BID) | ORAL | Status: DC

## 2013-09-05 NOTE — Progress Notes (Signed)
Pre-visit discussion using our clinic review tool. No additional management support is needed unless otherwise documented below in the visit note.  

## 2013-09-05 NOTE — Progress Notes (Signed)
Subjective:    Patient ID: Lawrence Rivas, male    DOB: Dec 18, 1950, 63 y.o.   MRN: [id number]  DOS:  09/05/2013 Acute visit,  Symptoms started 2.5 days ago: Aches, chills, fever (not measured), feeling weak in general . Some cough. Denies foreign trips recently  or sick contacts to his knowledge.  Not taking any meds for sx . Sx not getting better    ROS No  Rash  No  CP, SOB  Denies  nausea, vomiting diarrhea No  abdominal pain No sputum production, (-) wheezing, chest congestion + headaches     Past Medical History  Diagnosis Date  . Paroxysmal a-fib     no recurrence s/p atrial flutter ablation (previously atrial flutter felt to degenerate into afib)  . Numbness and tingling     right arm   . Psoriasis   . Erectile dysfunction   . TIA (transient ischemic attack) 2004    Past Surgical History  Procedure Laterality Date  . Total hip arthroplasty      right  . Tonsillectomy    . A flutter ablation      March 2011 at [facility] by Dr Lyanne Co Dixat    History   Social History  . Marital Status: Married    Spouse Name: N/A    Number of Children: 1  . Years of Education: N/A   Occupational History  . 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     Social History Main Topics  . Smoking status: Current Every Day Smoker -- 1.00 packs/day    Types: Cigarettes  . Smokeless tobacco: Never Used     Comment: he will try to quit  . Alcohol Use: Yes     Comment: 2 drinks of scotch per day  . Drug Use: No  . Sexual Activity: Not on file   Other Topics Concern  . Not on file   Social History Narrative            Medication List       This list is accurate as of: 09/05/13 11:59 PM.  Always use your most recent med list.               aspirin 325 MG tablet  Take 325 mg by mouth daily.     diltiazem 30 MG tablet  Commonly known as:  CARDIZEM  30 mg 2 (two) times daily as needed.     flecainide 100 MG tablet  Commonly known as:  TAMBOCOR    Take 2 tablets as needed for afib     fluticasone 50 MCG/ACT nasal spray  Commonly known as:  FLONASE  Place 2 sprays into the nose daily.     oseltamivir 75 MG capsule  Commonly known as:  TAMIFLU  Take 1 capsule (75 mg total) by mouth 2 (two) times daily.     tadalafil 20 MG tablet  Commonly known as:  CIALIS  Take 0.5-1 tablets (10-20 mg total) by mouth every other day as needed for erectile dysfunction.           Objective:   Physical Exam BP 122/76  Pulse 104  Temp(Src) 99.8 F (37.7 C) (Oral)  Wt 199 lb (90.266 kg)  SpO2 95% General -- alert, well-developed, Not toxic or acutely ill appearing.  HEENT-- TMs normal, throat symmetric, slt redness , no discharge. Face symmetric, sinuses not tender to palpation. Nose  congested. Lungs -- normal respiratory effort,  no intercostal retractions, no accessory muscle use, and normal breath sounds.  Heart-- normal rate, regular rhythm, no murmur.   Extremities-- no pretibial edema bilaterally  Neurologic--  alert & oriented X3. Speech normal, gait normal, strength normal in all extremities.  Psych-- Cognition and judgment appear intact. Cooperative with normal attention span and concentration. No anxious or depressed appearing.      Assessment & Plan:   Influenza New problem. Flu test +. Patient aware of the diagnoses, knows this is not a cold and he couldt get worse, complications include pneumonia,  bacterial sinusitis, etc . Plan: See instructions

## 2013-09-05 NOTE — Patient Instructions (Signed)
Rest, fluids , tylenol For cough, take Mucinex DM twice a day as needed   For pain -fever alternate tylenol, motrin: Tylenol  500 mg OTC 2 tabs a day every 8 hours as needed  Motrin 200 mg 2 tablets every 6 hours as needed. Always take it with food because may cause gastritis and ulcers. If you notice nausea, stomach pain, change in the color of stools --->  Stop the medicine and let us know  Take Tamiflu ASAP Call if no better in few days Call anytime if the symptoms are severe, you have high fever, short of breath, chest pain

## 2013-09-06 ENCOUNTER — Telehealth: Payer: Self-pay | Admitting: Internal Medicine

## 2013-09-06 NOTE — Telephone Encounter (Signed)
Relevant patient education mailed to patient.  

## 2013-12-29 ENCOUNTER — Encounter: Payer: Self-pay | Admitting: Internal Medicine

## 2013-12-29 ENCOUNTER — Ambulatory Visit (INDEPENDENT_AMBULATORY_CARE_PROVIDER_SITE_OTHER): Payer: BC Managed Care – PPO | Admitting: Internal Medicine

## 2013-12-29 VITALS — BP 133/73 | HR 75 | Temp 98.5°F | Wt 203.0 lb

## 2013-12-29 DIAGNOSIS — R109 Unspecified abdominal pain: Secondary | ICD-10-CM

## 2013-12-29 DIAGNOSIS — R1031 Right lower quadrant pain: Secondary | ICD-10-CM

## 2013-12-29 DIAGNOSIS — B369 Superficial mycosis, unspecified: Secondary | ICD-10-CM

## 2013-12-29 DIAGNOSIS — Z0000 Encounter for general adult medical examination without abnormal findings: Secondary | ICD-10-CM

## 2013-12-29 MED ORDER — KETOCONAZOLE 2 % EX CREA: 1.0000 "application " | TOPICAL_CREAM | Freq: Two times a day (BID) | CUTANEOUS | Status: DC

## 2013-12-29 NOTE — Assessment & Plan Note (Signed)
Wonders about the need to do a AAA screening, he is a smoker. We agreed to discuss that during his upcoming physical

## 2013-12-29 NOTE — Patient Instructions (Signed)
Mix  ketoconazole with OTC hydrocortisone cream 1%  (50-50) Apply to the  right foot twice a day for at least 3 weeks  If the pain at the right groin continue or if you ever see a bulging area there ---> please let me know.  Next visit by November 2015 for your physical

## 2013-12-29 NOTE — Progress Notes (Signed)
Pre-visit discussion using our clinic review tool. No additional management support is needed unless otherwise documented below in the visit note.  

## 2013-12-29 NOTE — Progress Notes (Signed)
Subjective:    Patient ID: Lawrence Rivas, male    DOB: [birth year], 63 y.o.   MRN: [id number]  DOS:  12/29/2013 Type of  Visit: acute History: Rash at the right plantar area for 3 weeks, blisters initially?. Currently hurt and itch. Hasn't seen a very small amount of bleeding due to scratching. Denies any purulent discharge or swelling.  Also 5-6 weeks history of "groin pain" (is actually @ the right suprapubic area) Denies any bulging or swelling. No nausea, vomiting, diarrhea. Worse with walking.   ROS See history of present illness  Past Medical History  Diagnosis Date  . Paroxysmal a-fib     no recurrence s/p atrial flutter ablation (previously atrial flutter felt to degenerate into afib)  . Numbness and tingling     right arm   . Psoriasis   . Erectile dysfunction   . TIA (transient ischemic attack) 2004    Past Surgical History  Procedure Laterality Date  . Total hip arthroplasty      right  . Tonsillectomy    . A flutter ablation      March 2011 at [facility] by Dr Lyanne Co Dixat    History   Social History  . Marital Status: Married    Spouse Name: N/A    Number of Children: 1  . Years of Education: N/A   Occupational History  . 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     Social History Main Topics  . Smoking status: Current Every Day Smoker -- 1.00 packs/day    Types: Cigarettes  . Smokeless tobacco: Never Used     Comment: he will try to quit  . Alcohol Use: Yes     Comment: 2 drinks of scotch per day  . Drug Use: No  . Sexual Activity: Not on file   Other Topics Concern  . Not on file   Social History Narrative            Medication List       This list is accurate as of: 12/29/13 11:59 PM.  Always use your most recent med list.               aspirin 325 MG tablet  Take 325 mg by mouth daily.     diltiazem 30 MG tablet  Commonly known as:  CARDIZEM  30 mg 2 (two) times daily as needed.     flecainide 100 MG  tablet  Commonly known as:  TAMBOCOR  Take 2 tablets as needed for afib     fluticasone 50 MCG/ACT nasal spray  Commonly known as:  FLONASE  Place 2 sprays into the nose daily.     ketoconazole 2 % cream  Commonly known as:  NIZORAL  Apply 1 application topically 2 (two) times daily.     tadalafil 20 MG tablet  Commonly known as:  CIALIS  Take 0.5-1 tablets (10-20 mg total) by mouth every other day as needed for erectile dysfunction.           Objective:   Physical Exam  Genitourinary:      BP 133/73  Pulse 75  Temp(Src) 98.5 F (36.9 C) (Oral)  Wt 203 lb (92.08 kg)  SpO2 97%  General -- alert, well-developed, NAD.   Abdomen--  Groins without any mass or lymphadenopathy. Right suprapubic area not tender to palpation, no mass. GU-- Scrotal contents normal, penis normal. Extremities-- no pretibial edema bilaterally ; Has  a 3 cm round patch of scaliness-redness and dryness at the right plantar Psych-- Cognition and judgment appear intact. Cooperative with normal attention span and concentration. No anxious or depressed appearing.       Assessment & Plan:   Fungal dermatitis, Most likely has a fungal dermatitis at the right foot, prescribe ketoconazole and hydrocortisone  Right suprapubic area, At this time I don't see evidence of a hernia, recommend observation. If no better will consider a surgical referral or CT.

## 2014-02-14 ENCOUNTER — Encounter: Payer: Self-pay | Admitting: Internal Medicine

## 2014-02-14 ENCOUNTER — Telehealth: Payer: Self-pay | Admitting: Internal Medicine

## 2014-02-14 ENCOUNTER — Ambulatory Visit (INDEPENDENT_AMBULATORY_CARE_PROVIDER_SITE_OTHER): Payer: BC Managed Care – PPO | Admitting: Internal Medicine

## 2014-02-14 VITALS — BP 138/78 | HR 84 | Temp 98.4°F | Wt 203.0 lb

## 2014-02-14 DIAGNOSIS — B369 Superficial mycosis, unspecified: Secondary | ICD-10-CM

## 2014-02-14 NOTE — Progress Notes (Signed)
   Subjective:    Patient ID: Lawrence Rivas, male    DOB: 1950-10-27, 63 y.o.   MRN: [id number]  DOS:  02/14/2014  Type of visit - description: f/u, no better History: Was seen with dermatitis on his right foot, used creams as prescribed, not getting better  ROS: No fever chills, no discharge  Past Medical History  Diagnosis Date  . Paroxysmal a-fib     no recurrence s/p atrial flutter ablation (previously atrial flutter felt to degenerate into afib)  . Numbness and tingling     right arm   . Psoriasis   . Erectile dysfunction   . TIA (transient ischemic attack) 2004    Past Surgical History  Procedure Laterality Date  . Total hip arthroplasty      right  . Tonsillectomy    . A flutter ablation      March 2011 at [facility] by Dr Lyanne Co Dixat    History   Social History  . Marital Status: Married    Spouse Name: N/A    Number of Children: 1  . Years of Education: N/A   Occupational History  . 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     Social History Main Topics  . Smoking status: Current Every Day Smoker -- 1.00 packs/day    Types: Cigarettes  . Smokeless tobacco: Never Used     Comment: he will try to quit  . Alcohol Use: Yes     Comment: 2 drinks of scotch per day  . Drug Use: No  . Sexual Activity: Not on file   Other Topics Concern  . Not on file   Social History Narrative            Medication List       This list is accurate as of: 02/14/14  6:19 PM.  Always use your most recent med list.               aspirin 325 MG tablet  Take 325 mg by mouth daily.     diltiazem 30 MG tablet  Commonly known as:  CARDIZEM  30 mg 2 (two) times daily as needed.     flecainide 100 MG tablet  Commonly known as:  TAMBOCOR  Take 2 tablets as needed for afib     fluticasone 50 MCG/ACT nasal spray  Commonly known as:  FLONASE  Place 2 sprays into the nose daily.     tadalafil 20 MG tablet  Commonly known as:  CIALIS  Take 0.5-1 tablets  (10-20 mg total) by mouth every other day as needed for erectile dysfunction.           Objective:   Physical Exam BP 138/78  Pulse 84  Temp(Src) 98.4 F (36.9 C) (Oral)  Wt 203 lb (92.08 kg)  SpO2 95% General -- alert, well-developed, NAD.   Extremities--  R plantar area continue w/ a 5x 3 cm round patch of scaliness-redness and dryness   Psych-- Cognition and judgment appear intact. Cooperative with normal attention span and concentration. No anxious or depressed appearing.        Assessment & Plan:   Right foot dermatitis, Not improving with antifungals and local steroids. Fungal dermatitis vs others? Plan:  Refer to dermatology, if diagnosis is confirmed as fungal he may need oral therapy.  Advise patient to minimize the use of OTCs local steroids

## 2014-02-14 NOTE — Patient Instructions (Addendum)
Will refer you  to dermatology  Next visit by 05-2014 for a physical, fasting

## 2014-02-14 NOTE — Telephone Encounter (Signed)
Relevant patient education mailed to patient.  

## 2015-03-20 ENCOUNTER — Ambulatory Visit (INDEPENDENT_AMBULATORY_CARE_PROVIDER_SITE_OTHER): Payer: BLUE CROSS/BLUE SHIELD | Admitting: Internal Medicine

## 2015-03-20 ENCOUNTER — Encounter: Payer: Self-pay | Admitting: Internal Medicine

## 2015-03-20 VITALS — BP 136/76 | HR 75 | Ht 72.0 in | Wt 208.0 lb

## 2015-03-20 DIAGNOSIS — Z720 Tobacco use: Secondary | ICD-10-CM | POA: Diagnosis not present

## 2015-03-20 DIAGNOSIS — I4891 Unspecified atrial fibrillation: Secondary | ICD-10-CM | POA: Diagnosis not present

## 2015-03-20 MED ORDER — DILTIAZEM HCL 30 MG PO TABS: 30.0000 mg | ORAL_TABLET | Freq: Two times a day (BID) | ORAL | Status: DC | PRN

## 2015-03-20 MED ORDER — FLECAINIDE ACETATE 100 MG PO TABS: 200.0000 mg | ORAL_TABLET | Freq: Every day | ORAL | Status: DC | PRN

## 2015-03-20 NOTE — Patient Instructions (Addendum)
Medication Instructions:  Your physician recommends that you continue on your current medications as directed. Please refer to the Current Medication list given to you today.   Labwork: None ordered  Testing/Procedures: None ordered  Follow-Up: Your physician wants you to follow-up in: 12 months with Dr Allred You will receive a reminder letter in the mail two months in advance. If you don't receive a letter, please call our office to schedule the follow-up appointment.   Any Other Special Instructions Will Be Listed Below (If Applicable).   

## 2015-03-20 NOTE — Progress Notes (Signed)
PCP: Kathlene November, MD  Lawrence Rivas is a 64 y.o. male who presents today for routine electrophysiology followup.  Since last being seen in our clinic, the patient reports doing very well. He has had 3-4 episodes of chest "fluttering"/ uneasiness for which he has taken diltiazem over the past year.  He has not required flecainide. These episodes occurred at rest and were very brief.  He denies any exertional symptoms.  He remains active and [place]  He also continues to smoke. Today, he denies symptoms of  chest pain, shortness of breath,  lower extremity edema, dizziness, presyncope, or syncope.  The patient is otherwise without complaint today.   Past Medical History  Diagnosis Date  . Paroxysmal a-fib     no recurrence s/p atrial flutter ablation (previously atrial flutter felt to degenerate into afib)  . Numbness and tingling     right arm   . Psoriasis   . Erectile dysfunction   . TIA (transient ischemic attack) 2004   Past Surgical History  Procedure Laterality Date  . Total hip arthroplasty      right  . Tonsillectomy    . A flutter ablation      March 2011 at [facility] by Dr Lawrence Rivas    Current Outpatient Prescriptions  Medication Sig Dispense Refill  . aspirin 325 MG tablet Take 325 mg by mouth daily.      Marland Kitchen diltiazem (CARDIZEM) 30 MG tablet Take 1 tablet (30 mg total) by mouth 2 (two) times daily as needed (Palpitations). 30 tablet 3  . flecainide (TAMBOCOR) 100 MG tablet Take 2 tablets (200 mg total) by mouth daily as needed (AFI B). 30 tablet 3  . fluticasone (FLONASE) 50 MCG/ACT nasal spray Place 2 sprays into the nose daily. 16 g 6  . tadalafil (CIALIS) 20 MG tablet Take 0.5-1 tablets (10-20 mg total) by mouth every other day as needed for erectile dysfunction. 5 tablet 1   No current facility-administered medications for this visit.    Physical Exam: Filed Vitals:   03/20/15 1505  BP: 136/76  Pulse:  75  Height: 6' (1.829 m)  Weight: 94.348 kg (208 lb)    GEN- The patient is well appearing, alert and oriented x 3 today.   Head- normocephalic, atraumatic Eyes-  Sclera clear, conjunctiva pink Ears- hearing intact Oropharynx- clear Lungs- Clear to ausculation bilaterally, normal work of breathing Heart- Regular rate and rhythm, no murmurs, rubs or gallops, PMI not laterally displaced GI- soft, NT, ND, + BS Extremities- no clubbing, cyanosis, or edema  ekg today reveals sinus rhythm with RBBB  Assessment and Plan:  1. afib Well controlled with prn cardizem Continue flecainide as needed (pill in pocket) chads2vasc score is at least 2 (prior TIA).  This patients CHA2DS2-VASc Score and unadjusted Ischemic Stroke Rate (% per year) is equal to 2.2 % stroke rate/year from a score of 2.  I have therefore advised anticoagulation.  He reports unsteadiness/ falls at times and is clear that he is not interested in anticoagulation presently.  We also discussed left atrial appendage occlusive devices which he is very interested in.  We will discuss this upon return. Today, I have spent 25  minutes with the patient discussing atrial fibrillation and stroke risks.  More than 50% of the visit time today was spent on this issue.  2. Tobacco Cessation encouraged   Return in 1 year Smoking cessation again encouraged

## 2016-02-18 ENCOUNTER — Encounter: Payer: Self-pay | Admitting: Internal Medicine

## 2016-03-02 ENCOUNTER — Ambulatory Visit (INDEPENDENT_AMBULATORY_CARE_PROVIDER_SITE_OTHER): Payer: BLUE CROSS/BLUE SHIELD | Admitting: Internal Medicine

## 2016-03-02 ENCOUNTER — Encounter: Payer: Self-pay | Admitting: Internal Medicine

## 2016-03-02 VITALS — BP 134/78 | HR 74 | Ht 72.0 in | Wt 213.4 lb

## 2016-03-02 DIAGNOSIS — I4891 Unspecified atrial fibrillation: Secondary | ICD-10-CM | POA: Diagnosis not present

## 2016-03-02 MED ORDER — FLECAINIDE ACETATE 100 MG PO TABS: 200.0000 mg | ORAL_TABLET | Freq: Every day | ORAL | 3 refills | Status: DC | PRN

## 2016-03-02 NOTE — Patient Instructions (Signed)

## 2016-03-02 NOTE — Progress Notes (Signed)
PCP: Kathlene November, MD  Lawrence Rivas is a 65 y.o. male who presents today for routine electrophysiology followup.  Since last being seen in our clinic, the patient reports doing very well. He has had 3-4 episodes of a premonition of AF for which he has taken diltiazem and Flecainide over the past year.  He has not required flecainide. These episodes are triggered by excess caffeine or slouching on the couch.  He has been able to decrease frequency with decreasing caffeine intake.  He continues to walk 3-4 miles per day without chest pain or shortness of breath. He remains active and continues [place]  He also continues to smoke. Today, he denies symptoms of  chest pain, shortness of breath,  lower extremity edema, dizziness, presyncope, or syncope.  The patient is otherwise without complaint today.   Past Medical History:  Diagnosis Date  . Erectile dysfunction   . Numbness and tingling    right arm   . Paroxysmal a-fib (HCC)    no recurrence s/p atrial flutter ablation (previously atrial flutter felt to degenerate into afib)  . Psoriasis   . TIA (transient ischemic attack) 2004   Past Surgical History:  Procedure Laterality Date  . A FLUTTER ABLATION     March 2011 at [facility] by Dr Lyanne Co Dixat  . TONSILLECTOMY    . TOTAL HIP ARTHROPLASTY     right    Current Outpatient Prescriptions  Medication Sig Dispense Refill  . aspirin 325 MG tablet Take 325 mg by mouth daily.      Marland Kitchen diltiazem (CARDIZEM) 30 MG tablet Take 1 tablet (30 mg total) by mouth 2 (two) times daily as needed (Palpitations). 30 tablet 3  . flecainide (TAMBOCOR) 100 MG tablet Take 2 tablets (200 mg total) by mouth daily as needed (AFI B). 30 tablet 3  . fluticasone (FLONASE) 50 MCG/ACT nasal spray Place 2 sprays into both nostrils daily as needed for allergies or rhinitis.    . tadalafil (CIALIS) 20 MG tablet Take 0.5-1 tablets (10-20 mg total) by mouth every other day as  needed for erectile dysfunction. 5 tablet 1   No current facility-administered medications for this visit.     Physical Exam: Vitals:   03/02/16 0934  BP: 134/78  Pulse: 74  Weight: 213 lb 6.4 oz (96.8 kg)  Height: 6' (1.829 m)    GEN- The patient is well appearing, alert and oriented x 3 today.   Head- normocephalic, atraumatic Eyes-  Sclera clear, conjunctiva pink Ears- hearing intact Oropharynx- clear Lungs- Clear to ausculation bilaterally, normal work of breathing Heart- Regular rate and rhythm, no murmurs, rubs or gallops, PMI not laterally displaced GI- soft, NT, ND, + BS Extremities- no clubbing, cyanosis, or edema  ekg today reveals sinus rhythm with bifascicular block, rate 74  Assessment and Plan:  1. Paroxysmal atrial fibrillation  Well controlled with prn cardizem/flecainide This patients CHA2DS2-VASc Score and unadjusted Ischemic Stroke Rate (% per year) is equal to 3.2 % stroke rate/year from a score of 3 Anticoagulation is recommended at this time.  He reports unsteadiness/ falls and previous issues with easy bleeding on anticoagulation and is clear that he is not wiling to take at this time. We discussed the fact that ASA does not protect against ischemic stroke.  We discussed Watchman at length again today.  He is also clear that he would like to avoid Watchman implant at this time.   2. Tobacco  Cessation encouraged   Return in 1 year  Thompson Grayer, MD 03/02/2016 10:37 AM

## 2016-05-12 DIAGNOSIS — L400 Psoriasis vulgaris: Secondary | ICD-10-CM | POA: Diagnosis not present

## 2016-05-12 DIAGNOSIS — L720 Epidermal cyst: Secondary | ICD-10-CM | POA: Diagnosis not present

## 2016-08-04 DIAGNOSIS — H2512 Age-related nuclear cataract, left eye: Secondary | ICD-10-CM | POA: Diagnosis not present

## 2016-08-04 DIAGNOSIS — H2513 Age-related nuclear cataract, bilateral: Secondary | ICD-10-CM | POA: Diagnosis not present

## 2016-08-04 DIAGNOSIS — H18413 Arcus senilis, bilateral: Secondary | ICD-10-CM | POA: Diagnosis not present

## 2016-08-04 DIAGNOSIS — H25013 Cortical age-related cataract, bilateral: Secondary | ICD-10-CM | POA: Diagnosis not present

## 2016-08-04 DIAGNOSIS — H25043 Posterior subcapsular polar age-related cataract, bilateral: Secondary | ICD-10-CM | POA: Diagnosis not present

## 2016-09-14 DIAGNOSIS — H2512 Age-related nuclear cataract, left eye: Secondary | ICD-10-CM | POA: Diagnosis not present

## 2016-09-15 DIAGNOSIS — H2511 Age-related nuclear cataract, right eye: Secondary | ICD-10-CM | POA: Diagnosis not present

## 2016-09-22 DIAGNOSIS — H2513 Age-related nuclear cataract, bilateral: Secondary | ICD-10-CM | POA: Diagnosis not present

## 2016-09-28 DIAGNOSIS — H2511 Age-related nuclear cataract, right eye: Secondary | ICD-10-CM | POA: Diagnosis not present

## 2016-10-06 DIAGNOSIS — H2513 Age-related nuclear cataract, bilateral: Secondary | ICD-10-CM | POA: Diagnosis not present

## 2017-04-12 ENCOUNTER — Telehealth: Payer: Self-pay | Admitting: Internal Medicine

## 2017-04-12 ENCOUNTER — Other Ambulatory Visit: Payer: Self-pay | Admitting: *Deleted

## 2017-04-12 MED ORDER — DILTIAZEM HCL 30 MG PO TABS: 30.0000 mg | ORAL_TABLET | Freq: Two times a day (BID) | ORAL | 0 refills | Status: DC | PRN

## 2017-04-12 MED ORDER — FLECAINIDE ACETATE 100 MG PO TABS: 200.0000 mg | ORAL_TABLET | Freq: Every day | ORAL | 0 refills | Status: DC | PRN

## 2017-04-12 NOTE — Telephone Encounter (Signed)
New Message   *STAT* If patient is at the pharmacy, call can be transferred to refill team.   1. Which medications need to be refilled? (please list name of each medication and dose if known)  Flecainide and Diltiazem  2. Which pharmacy/location (including street and city if local pharmacy) is medication to be sent to? 100mg .... 30mg    3. Do they need a 30 day or 90 day supply? [place]

## 2017-05-26 ENCOUNTER — Encounter: Payer: Self-pay | Admitting: Internal Medicine

## 2017-05-26 ENCOUNTER — Ambulatory Visit (INDEPENDENT_AMBULATORY_CARE_PROVIDER_SITE_OTHER): Payer: BLUE CROSS/BLUE SHIELD | Admitting: Internal Medicine

## 2017-05-26 VITALS — BP 130/78 | HR 79 | Ht 72.0 in | Wt 220.0 lb

## 2017-05-26 DIAGNOSIS — I480 Paroxysmal atrial fibrillation: Secondary | ICD-10-CM

## 2017-05-26 NOTE — Progress Notes (Signed)
   PCP: Colon Branch, MD   Primary EP: [name] is a 66 y.o. male who presents today for routine electrophysiology followup.  Since last being seen in our clinic, the patient reports doing very well.  His concern is with weight gain.  He continues to smoke.  Today, he denies symptoms of palpitations, chest pain, shortness of breath,  lower extremity edema, dizziness, presyncope, or syncope.  The patient is otherwise without complaint today.   Past Medical History:  Diagnosis Date  . Erectile dysfunction   . Numbness and tingling    right arm   . Paroxysmal A-fib (HCC)    no recurrence s/p atrial flutter ablation (previously atrial flutter felt to degenerate into afib)  . Psoriasis   . TIA (transient ischemic attack) 2004   Past Surgical History:  Procedure Laterality Date  . A FLUTTER ABLATION     March 2011 at [facility] by Dr Lyanne Co Dixat  . TONSILLECTOMY    . TOTAL HIP ARTHROPLASTY     right    ROS- all systems are reviewed and negatives except as per HPI above  Current Outpatient Medications  Medication Sig Dispense Refill  . aspirin 325 MG tablet Take 325 mg by mouth daily.      Marland Kitchen diltiazem (CARDIZEM) 30 MG tablet Take 1 tablet (30 mg total) by mouth 2 (two) times daily as needed (Palpitations). 60 tablet 0  . flecainide (TAMBOCOR) 100 MG tablet Take 2 tablets (200 mg total) by mouth daily as needed (AFI B). 60 tablet 0  . fluticasone (FLONASE) 50 MCG/ACT nasal spray Place 2 sprays into both nostrils daily as needed for allergies or rhinitis.     No current facility-administered medications for this visit.     Physical Exam: Vitals:   05/26/17 1243  BP: 130/78  Pulse: 79  SpO2: 97%  Weight: 220 lb (99.8 kg)  Height: 6' (1.829 m)    GEN- The patient is well appearing, alert and oriented x 3 today.   Head- normocephalic, atraumatic Eyes-  Sclera clear, conjunctiva pink Ears- hearing intact Oropharynx- clear Lungs- Clear to ausculation  bilaterally, normal work of breathing Heart- Regular rate and rhythm, no murmurs, rubs or gallops, PMI not laterally displaced GI- soft, NT, ND, + BS Extremities- no clubbing, cyanosis, or edema  EKG tracing ordered today is personally reviewed and shows  Sinus rhythm 79 bpm, RBBB  Assessment and Plan:  1. paroyxmsla atrial fibrillation Well controlled chads2vasc score is 3. He declines anticoagulation again today  2. Tobacco Cessation advised He is not ready to quit  3. Overweight Body mass index is 29.84 kg/m. He is concerned about his weight.  Return to see EP PA in a year  Thompson Grayer MD, [facility] 05/26/2017 12:56 PM

## 2017-05-26 NOTE — Patient Instructions (Signed)
Medication Instructions:  Your physician recommends that you continue on your current medications as directed. Please refer to the Current Medication list given to you today.  -- If you need a refill on your cardiac medications before your next appointment, please call your pharmacy. --  Labwork: None ordered  Testing/Procedures:   Follow-Up: Your physician wants you to follow-up in: 1 year with Dr. Rayann Heman.     Thank you for choosing [facility]!!   Frederik Schmidt, RN [phone]  Any Other Special Instructions Will Be Listed Below (If Applicable).

## 2017-06-02 ENCOUNTER — Ambulatory Visit (HOSPITAL_COMMUNITY)
Admission: RE | Admit: 2017-06-02 | Discharge: 2017-06-02 | Disposition: A | Discharge status: Home / Self Care | Payer: BLUE CROSS/BLUE SHIELD | Source: Ambulatory Visit | Attending: Internal Medicine | Admitting: Internal Medicine

## 2017-06-02 DIAGNOSIS — I480 Paroxysmal atrial fibrillation: Secondary | ICD-10-CM

## 2017-06-02 DIAGNOSIS — Z136 Encounter for screening for cardiovascular disorders: Secondary | ICD-10-CM | POA: Diagnosis not present

## 2017-06-02 DIAGNOSIS — F17210 Nicotine dependence, cigarettes, uncomplicated: Secondary | ICD-10-CM | POA: Diagnosis not present

## 2017-06-02 DIAGNOSIS — F17200 Nicotine dependence, unspecified, uncomplicated: Secondary | ICD-10-CM | POA: Insufficient documentation

## 2017-06-18 ENCOUNTER — Telehealth: Payer: Self-pay | Admitting: Internal Medicine

## 2017-06-18 NOTE — Telephone Encounter (Signed)
Spoke with patient about abdominal aorta results done 06/02/17

## 2017-06-18 NOTE — Telephone Encounter (Signed)
New message ° °Pt verbalized that he is returning call for the rn  °

## 2017-10-13 DIAGNOSIS — M25512 Pain in left shoulder: Secondary | ICD-10-CM | POA: Diagnosis not present

## 2017-10-13 DIAGNOSIS — M7542 Impingement syndrome of left shoulder: Secondary | ICD-10-CM | POA: Diagnosis not present

## 2018-05-06 ENCOUNTER — Encounter: Payer: Self-pay | Admitting: *Deleted

## 2018-05-30 ENCOUNTER — Encounter: Payer: Self-pay | Admitting: Internal Medicine

## 2018-05-30 ENCOUNTER — Ambulatory Visit (INDEPENDENT_AMBULATORY_CARE_PROVIDER_SITE_OTHER): Payer: BLUE CROSS/BLUE SHIELD | Admitting: Internal Medicine

## 2018-05-30 VITALS — BP 144/80 | HR 59 | Ht 73.0 in | Wt 213.8 lb

## 2018-05-30 DIAGNOSIS — I480 Paroxysmal atrial fibrillation: Secondary | ICD-10-CM

## 2018-05-30 NOTE — Progress Notes (Signed)
   PCP: Lawrence Branch, MD   Primary EP: Dr Everlean Patterson SHONTE SODERLUND is a 67 y.o. male who presents today for routine electrophysiology followup.  Since last being seen in our clinic, the patient reports doing very well.  Today, he denies symptoms of palpitations, chest pain,   lower extremity edema, dizziness, presyncope, or syncope.  SOB on inclines but typicall without symptoms on exertion.  Not interested in smoking cessation.  The patient is otherwise without complaint today.   Past Medical History:  Diagnosis Date  . Erectile dysfunction   . Numbness and tingling    right arm   . Paroxysmal A-fib (HCC)    no recurrence s/p atrial flutter ablation (previously atrial flutter felt to degenerate into afib)  . Psoriasis   . TIA (transient ischemic attack) 2004   Past Surgical History:  Procedure Laterality Date  . A FLUTTER ABLATION     March 2011 at [facility] by Dr Lyanne Co Dixat  . TONSILLECTOMY    . TOTAL HIP ARTHROPLASTY     right    ROS- all systems are reviewed and negatives except as per HPI above  Current Outpatient Medications  Medication Sig Dispense Refill  . aspirin 325 MG tablet Take 325 mg by mouth daily.      Marland Kitchen diltiazem (CARDIZEM) 30 MG tablet Take 1 tablet (30 mg total) by mouth 2 (two) times daily as needed (Palpitations). 60 tablet 0  . flecainide (TAMBOCOR) 100 MG tablet Take 2 tablets (200 mg total) by mouth daily as needed (AFI B). 60 tablet 0  . fluticasone (FLONASE) 50 MCG/ACT nasal spray Place 2 sprays into both nostrils daily as needed for allergies or rhinitis.     No current facility-administered medications for this visit.     Physical Exam: Vitals:   05/30/18 1208  BP: (!) 144/80  Pulse: (!) 59  SpO2: 98%  Weight: 213 lb 12.8 oz (97 kg)  Height: 6\' 1"  (1.854 m)    GEN- The patient is well appearing, alert and oriented x 3 today.   Head- normocephalic, atraumatic Eyes-  Sclera clear, conjunctiva pink Ears- hearing  intact Oropharynx- clear Lungs- Clear to ausculation bilaterally, normal work of breathing Heart- Regular rate and rhythm, no murmurs, rubs or gallops, PMI not laterally displaced GI- soft, NT, ND, + BS Extremities- no clubbing, cyanosis, or edema  Wt Readings from Last 3 Encounters:  05/30/18 213 lb 12.8 oz (97 kg)  05/26/17 220 lb (99.8 kg)  03/02/16 213 lb 6.4 oz (96.8 kg)    EKG tracing ordered today is personally reviewed and shows sinus rhythm with RBBB  Assessment and Plan:  1. Paroxysmal atrial fibrillation Doing well, without any recent episodes.  Has not required pill in pocket diltiazem or flecainide Not interested in anticoagulation  2. Tobacco Cessation advised  Return to see EP PA annually I will see when needed  Thompson Grayer MD, [facility] 05/30/2018 12:49 PM

## 2018-05-30 NOTE — Patient Instructions (Addendum)

## 2018-06-01 ENCOUNTER — Other Ambulatory Visit: Payer: Self-pay | Admitting: *Deleted

## 2018-06-01 MED ORDER — DILTIAZEM HCL 30 MG PO TABS: 30.0000 mg | ORAL_TABLET | Freq: Two times a day (BID) | ORAL | 11 refills | Status: DC | PRN

## 2018-06-01 MED ORDER — FLECAINIDE ACETATE 100 MG PO TABS: 200.0000 mg | ORAL_TABLET | Freq: Every day | ORAL | 11 refills | Status: DC | PRN

## 2018-06-01 NOTE — Telephone Encounter (Signed)
Patients wife called and stated that the patient was seen in the office on Monday and requested refills and they were assured that they would be sent in. They have called the pharmacy multiple times and even called the pharmacy that they used previously and are being told that no rx's have been received. Wife also states that they asked for the preferred pharmacy be changed from [facility] to [facility] but this was not handled either. I have updated pharmacy preference as requested, apologized for this and made her aware that I will send the rx's in.

## 2018-06-28 DIAGNOSIS — R072 Precordial pain: Secondary | ICD-10-CM | POA: Diagnosis not present

## 2018-06-28 DIAGNOSIS — I4510 Unspecified right bundle-branch block: Secondary | ICD-10-CM | POA: Diagnosis not present

## 2018-06-29 ENCOUNTER — Telehealth: Payer: Self-pay | Admitting: Internal Medicine

## 2018-06-29 NOTE — Telephone Encounter (Signed)
New message    Lawrence Rivas to [facility] yesterday for chest pressure, dizziness, a feeling of blood rushing to his head bp 141 /81 hr 81  [facility] , blood work, and chest xray - left the hospital without seeing doctor   Set up appt for Monday 07/04/18 1015a   Pt c/o of Chest Pain: STAT if CP now or developed within 24 hours  1. Are you having CP right now?  no  2. Are you experiencing any other symptoms (ex. SOB, nausea, vomiting, sweating)? Sweating , chest pressure, lightheadedness  3. How long have you been experiencing CP? 6 or 7 times yesterday - went to [facility]   4. Is your CP continuous or coming and going? Coming and going  5. Have you taken Nitroglycerin? no ?

## 2018-06-29 NOTE — Telephone Encounter (Signed)
Spoke to the patient's wife who said that he is at work and has been feeling ok.  She feels that he is well enough to come in Monday 12/23 for the appt scheduled with Dr Rayann Heman.  If his symptoms return prior to that, they will call our office or go to the ED.  She was thankful for the call.

## 2018-06-29 NOTE — Telephone Encounter (Signed)
lpmtcb 12/18

## 2018-06-30 ENCOUNTER — Telehealth: Payer: Self-pay | Admitting: Internal Medicine

## 2018-06-30 NOTE — Telephone Encounter (Signed)
New message   Patient is returning calling from 06/29/2018.

## 2018-06-30 NOTE — Telephone Encounter (Signed)
Pt called back and is feeling ok.Pt called  to make sure we would have records from ED visit reaassured pt that test results were available in care everywhere .Adonis Housekeeper

## 2018-07-04 ENCOUNTER — Encounter: Payer: Self-pay | Admitting: Internal Medicine

## 2018-07-04 ENCOUNTER — Ambulatory Visit (INDEPENDENT_AMBULATORY_CARE_PROVIDER_SITE_OTHER): Payer: BLUE CROSS/BLUE SHIELD | Admitting: Internal Medicine

## 2018-07-04 VITALS — BP 114/72 | HR 61 | Ht 73.0 in | Wt 215.4 lb

## 2018-07-04 DIAGNOSIS — R0602 Shortness of breath: Secondary | ICD-10-CM | POA: Diagnosis not present

## 2018-07-04 DIAGNOSIS — I480 Paroxysmal atrial fibrillation: Secondary | ICD-10-CM | POA: Diagnosis not present

## 2018-07-04 DIAGNOSIS — R55 Syncope and collapse: Secondary | ICD-10-CM | POA: Diagnosis not present

## 2018-07-04 DIAGNOSIS — R079 Chest pain, unspecified: Secondary | ICD-10-CM | POA: Diagnosis not present

## 2018-07-04 NOTE — Progress Notes (Signed)
Electrophysiology Office Note Date: 07/04/2018  ID:  Lawrence Rivas, Lawrence Rivas 1951/01/09, MRN [id number]  PCP: Colon Branch, MD  Electrophysiologist: Dr Rayann Heman  CC: Follow up after ER visit.  Lawrence Rivas is a 67 y.o. male seen today after an ER visit. He reports that five days ago, he felt that his HR was mildly elevated (90-100 bpm) and so he took his PRN flecainide and diltiazem. About 1-2 hours later, he had chest pressure, presyncope, and diaphoresis which lasted for a few seconds to a minute. This occurred 3-4 times and then he decided to go to the ER. Workup was unremarkable but he left before seeing MD. He has had no further episodes, even during his 4 mile walks.  He denies dyspnea, PND, orthopnea, nausea, vomiting, syncope, edema, weight gain, or early satiety. +chest discomfort, +presyncope, +palpitations.  Past Medical History:  Diagnosis Date  . Erectile dysfunction   . Numbness and tingling    right arm   . Paroxysmal A-fib (HCC)    no recurrence s/p atrial flutter ablation (previously atrial flutter felt to degenerate into afib)  . Psoriasis   . TIA (transient ischemic attack) 2004   Past Surgical History:  Procedure Laterality Date  . A FLUTTER ABLATION     March 2011 at [facility] by Dr Lyanne Co Dixat  . TONSILLECTOMY    . TOTAL HIP ARTHROPLASTY     right    Current Outpatient Medications  Medication Sig Dispense Refill  . aspirin 325 MG tablet Take 325 mg by mouth daily.      Marland Kitchen diltiazem (CARDIZEM) 30 MG tablet Take 1 tablet (30 mg total) by mouth 2 (two) times daily as needed (Palpitations). 60 tablet 11  . fluticasone (FLONASE) 50 MCG/ACT nasal spray Place 2 sprays into both nostrils daily as needed for allergies or rhinitis.     No current facility-administered medications for this visit.     Allergies:   Watermelon [citrullus vulgaris] and Coconut oil   Social History: Social History   Socioeconomic History  . Marital status:  Married    Spouse name: Not on file  . Number of children: 1  . Years of education: Not on file  . Highest education level: Not on file  Occupational History  . Occupation: Architect    Comment: [employer]     Employer: [employer]  Social Needs  . Financial resource strain: Not on file  . Food insecurity:    Worry: Not on file    Inability: Not on file  . Transportation needs:    Medical: Not on file    Non-medical: Not on file  Tobacco Use  . Smoking status: Current Every Day Smoker    Packs/day: 1.00    Types: Cigarettes  . Smokeless tobacco: Never Used  . Tobacco comment: he will try to quit  Substance and Sexual Activity  . Alcohol use: Yes    Comment: 2 drinks of scotch per day  . Drug use: No  . Sexual activity: Not on file  Lifestyle  . Physical activity:    Days per week: Not on file    Minutes per session: Not on file  . Stress: Not on file  Relationships  . Social connections:    Talks on phone: Not on file    Gets together: Not on file    Attends religious service: Not on file    Active member of club or organization: Not on  file    Attends meetings of clubs or organizations: Not on file    Relationship status: Not on file  . Intimate partner violence:    Fear of current or ex partner: Not on file    Emotionally abused: Not on file    Physically abused: Not on file    Forced sexual activity: Not on file  Other Topics Concern  . Not on file  Social History Narrative        Family History: Family History  Problem Relation Age of Onset  . Coronary artery disease Father 100  . Stroke Mother        in her 64-90s  . Diabetes Maternal Grandmother   . Colon cancer Neg Hx   . Prostate cancer Neg Hx     Review of Systems: All other systems reviewed and are otherwise negative except as noted above.   Physical Exam: VS:  BP 114/72   Pulse 61   Ht 6\' 1"  (1.854 m)   Wt 215 lb 6.4 oz (97.7 kg)   SpO2 98%   BMI 28.42 kg/m  , BMI Body mass index  is 28.42 kg/m. Wt Readings from Last 3 Encounters:  07/04/18 215 lb 6.4 oz (97.7 kg)  05/30/18 213 lb 12.8 oz (97 kg)  05/26/17 220 lb (99.8 kg)    GEN- The patient is well appearing, alert and oriented x 3 today.   HEENT: normocephalic, atraumatic; sclera clear, conjunctiva pink; hearing intact; oropharynx clear; neck supple, no JVP Lymph- no cervical lymphadenopathy Lungs- Clear to ausculation bilaterally, normal work of breathing.  No wheezes, rales, rhonchi Heart- Regular rate and rhythm, no murmurs, rubs or gallops, PMI not laterally displaced Extremities- no clubbing, cyanosis, or edema; DP/PT/radial pulses 2+ bilaterally MS- no significant deformity or atrophy Skin- warm and dry, no rash or lesion  Psych- euthymic mood, full affect Neuro- strength and sensation are intact   EKG:  EKG is ordered today. The ekg ordered today shows sinus rhythm HR 61, RBBB, PACs, PR 120, QRS 130, QTc 438  Recent Labs: No results found for requested labs within last 8760 hours.    Other studies Reviewed: Additional studies/ records that were reviewed today include: [facility] ER notes  Assessment and Plan:  1. Paroxysmal atrial fibrillation Patient has been on PRN flecainide 200mg  daily and diltiazem 30 mg q4-6 hours. Will stop flecainide, possible that this caused his symptoms. ?transient heart block with baseline RBBB. Continue diltiazem for elevated HR  2.. Presyncope/chest discomfort Given patient's extensive smoking history, will proceed with exercise stress test to help rule out ischemia. Will also get echocardiogram to assess ventricular size and function as well as valvular status.  3. Tobacco Abuse Cessation advised. Patient reports he has cut down significantly.   Current medicines are reviewed at length with the patient today.   The patient does not have concerns regarding his medicines.  The following changes were made today:  Stop flecainide  Labs/ tests ordered today  include:  Orders Placed This Encounter  Procedures  . Myocardial Perfusion Imaging  . EKG 12-Lead  . ECHOCARDIOGRAM COMPLETE     Disposition:   Follow up with me in 4 weeks   Army Fossa MD 07/04/2018 12:00 PM   [place] [address] [phone] (office) [phone] (fax)

## 2018-07-04 NOTE — Patient Instructions (Addendum)
Medication Instructions:  Your physician has recommended you make the following change in your medication:   1.  Stop flecainide  Labwork: None ordered.  Testing/Procedures: Your physician has requested that you have an echocardiogram. Echocardiography is a painless test that uses sound waves to create images of your heart. It provides your doctor with information about the size and shape of your heart and how well your heart's chambers and valves are working. This procedure takes approximately one hour. There are no restrictions for this procedure.  Please schedule for ECHO  Your physician has requested that you have en exercise stress myoview. For further information please visit [email or web address]. Please follow instruction sheet, as given.  Please schedule for exercise (treadmill) myoview  Follow-Up: Your physician wants you to follow-up in: 4 weeks with Dr. Rayann Heman.       Any Other Special Instructions Will Be Listed Below (If Applicable).  If you need a refill on your cardiac medications before your next appointment, please call your pharmacy.

## 2018-07-20 ENCOUNTER — Telehealth (HOSPITAL_COMMUNITY): Payer: Self-pay | Admitting: *Deleted

## 2018-07-20 NOTE — Telephone Encounter (Signed)
Patient given detailed instructions per Myocardial Perfusion Study Information Sheet for the test on 07/25/18 at 1030. Patient notified to arrive 15 minutes early and that it is imperative to arrive on time for appointment to keep from having the test rescheduled.  If you need to cancel or reschedule your appointment, please call the office within 24 hours of your appointment. . Patient verbalized understanding.Lorrin Bodner, Ranae Palms

## 2018-07-25 ENCOUNTER — Ambulatory Visit (HOSPITAL_BASED_OUTPATIENT_CLINIC_OR_DEPARTMENT_OTHER): Payer: Medicare Other

## 2018-07-25 ENCOUNTER — Other Ambulatory Visit (HOSPITAL_COMMUNITY): Payer: BLUE CROSS/BLUE SHIELD

## 2018-07-25 ENCOUNTER — Encounter (HOSPITAL_COMMUNITY): Payer: BLUE CROSS/BLUE SHIELD

## 2018-07-25 ENCOUNTER — Other Ambulatory Visit: Payer: Self-pay

## 2018-07-25 ENCOUNTER — Ambulatory Visit (HOSPITAL_COMMUNITY): Payer: Medicare Other | Attending: Cardiovascular Disease

## 2018-07-25 DIAGNOSIS — R55 Syncope and collapse: Secondary | ICD-10-CM | POA: Insufficient documentation

## 2018-07-25 DIAGNOSIS — R0602 Shortness of breath: Secondary | ICD-10-CM | POA: Diagnosis not present

## 2018-07-25 DIAGNOSIS — R079 Chest pain, unspecified: Secondary | ICD-10-CM | POA: Diagnosis not present

## 2018-07-25 DIAGNOSIS — I480 Paroxysmal atrial fibrillation: Secondary | ICD-10-CM

## 2018-07-25 LAB — MYOCARDIAL PERFUSION IMAGING
CHL CUP NUCLEAR SDS: 0
Estimated workload: 8.5 METS
Exercise duration (min): 7 min
LV dias vol: 111 mL (ref 62–150)
LV sys vol: 56 mL
MPHR: 152 {beats}/min
NUC STRESS TID: 1
Peak HR: 151 {beats}/min
Percent HR: 99 %
RPE: 18
Rest HR: 57 {beats}/min
SRS: 0
SSS: 0

## 2018-07-25 MED ORDER — TECHNETIUM TC 99M TETROFOSMIN IV KIT
10.5000 | PACK | Freq: Once | INTRAVENOUS | Status: AC | PRN
  Administered 2018-07-25: 10.5 via INTRAVENOUS
  Filled 2018-07-25: qty 11

## 2018-07-25 MED ORDER — TECHNETIUM TC 99M TETROFOSMIN IV KIT
32.4000 | PACK | Freq: Once | INTRAVENOUS | Status: AC | PRN
  Administered 2018-07-25: 32.4 via INTRAVENOUS
  Filled 2018-07-25: qty 33

## 2018-07-26 ENCOUNTER — Telehealth: Payer: Self-pay

## 2018-07-26 NOTE — Telephone Encounter (Signed)
Attempted to all pt re: his Myoview and Echo results but no answer... Pt is seeing Dr. Rayann Heman.. 07/29/2018.

## 2018-07-26 NOTE — Telephone Encounter (Signed)
-----   Message from Thompson Grayer, MD sent at 07/25/2018  9:51 PM EST ----- Results reviewed.  Lawrence Rivas, please inform pt of low risk result. I will route to primary care also.

## 2018-07-29 ENCOUNTER — Encounter: Payer: Self-pay | Admitting: Internal Medicine

## 2018-07-29 ENCOUNTER — Ambulatory Visit: Payer: Medicare Other | Admitting: Internal Medicine

## 2018-07-29 VITALS — BP 128/80 | HR 63 | Ht 73.0 in | Wt 218.2 lb

## 2018-07-29 DIAGNOSIS — Z8673 Personal history of transient ischemic attack (TIA), and cerebral infarction without residual deficits: Secondary | ICD-10-CM | POA: Diagnosis not present

## 2018-07-29 DIAGNOSIS — I480 Paroxysmal atrial fibrillation: Secondary | ICD-10-CM | POA: Diagnosis not present

## 2018-07-29 LAB — LIPID PANEL
Chol/HDL Ratio: 2.4 ratio (ref 0.0–5.0)
Cholesterol, Total: 174 mg/dL (ref 100–199)
HDL: 72 mg/dL (ref >39)
LDL CALC: 93 mg/dL (ref 0–99)
Triglycerides: 45 mg/dL (ref 0–149)
VLDL CHOLESTEROL CAL: 9 mg/dL (ref 5–40)

## 2018-07-29 NOTE — Patient Instructions (Addendum)
Medication Instructions:  Your physician recommends that you continue on your current medications as directed. Please refer to the Current Medication list given to you today.  Labwork: You will get lab work today:  Fasting lipids  Testing/Procedures: None ordered.  Follow-Up: Your physician wants you to follow-up in: one year with Dr. Rayann Heman.   You will receive a reminder letter in the mail two months in advance. If you don't receive a letter, please call our office to schedule the follow-up appointment.   Any Other Special Instructions Will Be Listed Below (If Applicable).  If you need a refill on your cardiac medications before your next appointment, please call your pharmacy.

## 2018-07-29 NOTE — Progress Notes (Signed)
   PCP: Colon Branch, MD   Primary EP: [name] is a 68 y.o. male who presents today for routine electrophysiology followup.  Since last being seen in our clinic, the patient reports doing very well.  Today, he denies symptoms of palpitations, chest pain, shortness of breath,  lower extremity edema, dizziness, presyncope, or syncope.  He has occasional leg cramps with heavy exercise.  He walks on a treadmill most days without chest pain or worsening SOB. The patient is otherwise without complaint today.   Past Medical History:  Diagnosis Date  . Erectile dysfunction   . Numbness and tingling    right arm   . Paroxysmal A-fib (HCC)    no recurrence s/p atrial flutter ablation (previously atrial flutter felt to degenerate into afib)  . Psoriasis   . TIA (transient ischemic attack) 2004   Past Surgical History:  Procedure Laterality Date  . A FLUTTER ABLATION     March 2011 at [facility] by Dr Lyanne Co Dixat  . TONSILLECTOMY    . TOTAL HIP ARTHROPLASTY     right    ROS- all systems are reviewed and negatives except as per HPI above  Current Outpatient Medications  Medication Sig Dispense Refill  . aspirin 325 MG tablet Take 325 mg by mouth daily.      Marland Kitchen diltiazem (CARDIZEM) 30 MG tablet Take 1 tablet (30 mg total) by mouth 2 (two) times daily as needed (Palpitations). 60 tablet 11  . fluticasone (FLONASE) 50 MCG/ACT nasal spray Place 2 sprays into both nostrils daily as needed for allergies or rhinitis.     No current facility-administered medications for this visit.     Physical Exam: Vitals:   07/29/18 1110  BP: 128/80  Pulse: 63  SpO2: 96%  Weight: 218 lb 3.2 oz (99 kg)  Height: 6\' 1"  (1.854 m)    GEN- The patient is well appearing, alert and oriented x 3 today.   Head- normocephalic, atraumatic Eyes-  Sclera clear, conjunctiva pink Ears- hearing intact Oropharynx- clear Lungs- Clear to ausculation bilaterally, normal work of  breathing Heart- Regular rate and rhythm, no murmurs, rubs or gallops, PMI not laterally displaced GI- soft, NT, ND, + BS Extremities- no clubbing, cyanosis, or edema  Wt Readings from Last 3 Encounters:  07/29/18 218 lb 3.2 oz (99 kg)  07/25/18 215 lb (97.5 kg)  07/04/18 215 lb 6.4 oz (97.7 kg)   Echo 07/25/2018- EF 50-55%, mild LVH myoview 07/25/2018- EF 50%, no ischemic changes   EKG tracing ordered today is personally reviewed and shows sinus rhythm with PACs, RBBB  Assessment and Plan:  1. Presyncope/ SOB Resolved Echo and myoview low risk, reviewed with him today  2. Paroxysmal atrial fibrillation Doing well off flecainide Declines anticoagualtion  3. Tobacco Significantly reduced Cessation encouraged  4. Check fasting lipids today  5. Claudication We discussed his risks of PAD He is not interested in additional testing today.  He would consider ABIs and dopplers if leg cramps worsen.  Return to see me in a year  Thompson Grayer MD, [facility] 07/29/2018 11:46 AM

## 2018-09-01 DIAGNOSIS — Z0120 Encounter for dental examination and cleaning without abnormal findings: Secondary | ICD-10-CM | POA: Diagnosis not present

## 2019-02-22 DIAGNOSIS — H903 Sensorineural hearing loss, bilateral: Secondary | ICD-10-CM | POA: Diagnosis not present

## 2019-02-22 DIAGNOSIS — H9311 Tinnitus, right ear: Secondary | ICD-10-CM | POA: Diagnosis not present

## 2019-02-22 DIAGNOSIS — H6122 Impacted cerumen, left ear: Secondary | ICD-10-CM | POA: Diagnosis not present

## 2019-05-22 DIAGNOSIS — Z0120 Encounter for dental examination and cleaning without abnormal findings: Secondary | ICD-10-CM | POA: Diagnosis not present

## 2019-07-19 DIAGNOSIS — H524 Presbyopia: Secondary | ICD-10-CM | POA: Diagnosis not present

## 2019-07-28 DIAGNOSIS — H26492 Other secondary cataract, left eye: Secondary | ICD-10-CM | POA: Diagnosis not present

## 2019-07-28 DIAGNOSIS — Z961 Presence of intraocular lens: Secondary | ICD-10-CM | POA: Diagnosis not present

## 2019-07-29 ENCOUNTER — Ambulatory Visit: Payer: Medicare Other | Attending: Internal Medicine

## 2019-07-29 DIAGNOSIS — Z23 Encounter for immunization: Secondary | ICD-10-CM

## 2019-07-29 NOTE — Progress Notes (Signed)
   Covid-19 Vaccination Clinic  Name:  Lawrence Rivas    MRN: [id number] DOB: Jun 27, 1951  07/29/2019  Mr. Kall was observed post Covid-19 immunization for 15 minutes without incidence. He was provided with Vaccine Information Sheet and instruction to access the V-Safe system.   Mr. Skipton was instructed to call 911 with any severe reactions post vaccine: Marland Kitchen Difficulty breathing  . Swelling of your face and throat  . A fast heartbeat  . A bad rash all over your body  . Dizziness and weakness    Immunizations Administered    Name Date Dose VIS Date Route   Pfizer COVID-19 Vaccine 07/29/2019 12:40 PM 0.3 mL 06/23/2019 Intramuscular   Manufacturer: [place]   Lot: [id number]   [place]: [id number]

## 2019-08-11 DIAGNOSIS — H26491 Other secondary cataract, right eye: Secondary | ICD-10-CM | POA: Diagnosis not present

## 2019-08-18 DIAGNOSIS — H2513 Age-related nuclear cataract, bilateral: Secondary | ICD-10-CM | POA: Diagnosis not present

## 2019-08-19 ENCOUNTER — Ambulatory Visit: Payer: Medicare Other | Attending: Internal Medicine

## 2019-08-19 DIAGNOSIS — Z23 Encounter for immunization: Secondary | ICD-10-CM | POA: Insufficient documentation

## 2019-08-19 NOTE — Progress Notes (Signed)
   Covid-19 Vaccination Clinic  Name:  BRAILYNN LAMATTINA    MRN: [id number] DOB: Mar 21, 1951  08/19/2019  Mr. Bowersock was observed post Covid-19 immunization for 15 minutes without incidence. He was provided with Vaccine Information Sheet and instruction to access the V-Safe system.   Mr. Drennon was instructed to call 911 with any severe reactions post vaccine: Marland Kitchen Difficulty breathing  . Swelling of your face and throat  . A fast heartbeat  . A bad rash all over your body  . Dizziness and weakness    Immunizations Administered    Name Date Dose VIS Date Route   Pfizer COVID-19 Vaccine 08/19/2019  1:24 PM 0.3 mL 06/23/2019 Intramuscular   Manufacturer: [place]   Lot: [id number]   [place]: [id number]

## 2019-08-21 ENCOUNTER — Telehealth: Payer: Self-pay

## 2019-08-22 NOTE — Telephone Encounter (Signed)
Left a message regarding virtual appt on 08/25/19.

## 2019-08-25 ENCOUNTER — Telehealth (INDEPENDENT_AMBULATORY_CARE_PROVIDER_SITE_OTHER): Payer: Medicare Other | Admitting: Internal Medicine

## 2019-08-25 ENCOUNTER — Encounter: Payer: Self-pay | Admitting: Internal Medicine

## 2019-08-25 ENCOUNTER — Telehealth: Payer: Self-pay

## 2019-08-25 VITALS — BP 148/82 | HR 73 | Ht 73.0 in | Wt 205.0 lb

## 2019-08-25 DIAGNOSIS — I480 Paroxysmal atrial fibrillation: Secondary | ICD-10-CM | POA: Diagnosis not present

## 2019-08-25 DIAGNOSIS — Z8673 Personal history of transient ischemic attack (TIA), and cerebral infarction without residual deficits: Secondary | ICD-10-CM

## 2019-08-25 DIAGNOSIS — Z720 Tobacco use: Secondary | ICD-10-CM

## 2019-08-25 DIAGNOSIS — I119 Hypertensive heart disease without heart failure: Secondary | ICD-10-CM

## 2019-08-25 MED ORDER — DILTIAZEM HCL 30 MG PO TABS: 30.0000 mg | ORAL_TABLET | Freq: Two times a day (BID) | ORAL | 11 refills | Status: DC | PRN

## 2019-08-25 NOTE — Telephone Encounter (Signed)
-----   Message from Thompson Grayer, MD sent at 08/25/2019 10:38 AM EST ----- Please schedule for LINQ implant in office next DOD day  Please check w insurance if able to verify payment.  Also needs refill on his dilt.

## 2019-08-25 NOTE — Progress Notes (Signed)
Electrophysiology TeleHealth Note   Due to national recommendations of social distancing due to COVID 19, an audio/video telehealth visit is felt to be most appropriate for this patient at this time.  See MyChart message from today for the patient's consent to telehealth for [facility].  Date:  08/25/2019   ID:  Lawrence Rivas, DOB 11/24/1950, MRN [id number]  Location: patient's home  Provider location:  [facility]  Evaluation Performed: Follow-up visit  PCP:  No primary care provider on file.   Electrophysiologist:  Dr Rayann Heman  Chief Complaint:  AF follow up  History of Present Illness:    Lawrence Rivas is a 69 y.o. male who presents via telehealth conferencing today.  Since last being seen in our clinic, the patient reports doing reasonably well.  He has been working on lifestyle modification.  He has received both doses of the COVID vaccine. He has some periods of "pre AF" with palpitations.  Today, he denies symptoms of chest pain, shortness of breath,  lower extremity edema, dizziness, presyncope, or syncope.  The patient is otherwise without complaint today.  The patient denies symptoms of fevers, chills, cough, or new SOB worrisome for COVID 19.  Past Medical History:  Diagnosis Date  . Erectile dysfunction   . Numbness and tingling    right arm   . Paroxysmal A-fib (HCC)    no recurrence s/p atrial flutter ablation (previously atrial flutter felt to degenerate into afib)  . Psoriasis   . TIA (transient ischemic attack) 2004    Past Surgical History:  Procedure Laterality Date  . A FLUTTER ABLATION     March 2011 at [facility] by Dr Lyanne Co Dixat  . TONSILLECTOMY    . TOTAL HIP ARTHROPLASTY     right    Current Outpatient Medications  Medication Sig Dispense Refill  . aspirin 325 MG tablet Take 325 mg by mouth daily.      Marland Kitchen diltiazem (CARDIZEM) 30 MG tablet Take 1 tablet (30 mg total) by mouth 2 (two) times daily as needed (Palpitations).  60 tablet 11  . flecainide (TAMBOCOR) 100 MG tablet Take 100 mg by mouth 2 (two) times daily as needed.    . fluticasone (FLONASE) 50 MCG/ACT nasal spray Place 2 sprays into both nostrils daily as needed for allergies or rhinitis.     No current facility-administered medications for this visit.    Allergies:   Watermelon [citrullus vulgaris] and Coconut oil   Social History:  The patient  reports that he has been smoking cigarettes. He has been smoking about 1.00 pack per day. He has never used smokeless tobacco. He reports current alcohol use. He reports that he does not use drugs.   Family History:  The patient's  family history includes Coronary artery disease (age of onset: 18) in his father; Diabetes in his maternal grandmother; Stroke in his mother.   ROS:  Please see the history of present illness.   All other systems are personally reviewed and negative.    Exam:    Vital Signs:  BP (!) 148/82   Pulse 73   Ht 6\' 1"  (1.854 m)   Wt 205 lb (93 kg)   BMI 27.05 kg/m   Well sounding and appearing, alert and conversant, regular work of breathing,  good skin color Eyes- anicteric, neuro- grossly intact, skin- no apparent rash or lesions or cyanosis, mouth- oral mucosa is pink  Labs/Other Tests and Data Reviewed:    Recent  Labs: No results found for requested labs within last 8760 hours.   Wt Readings from Last 3 Encounters:  08/25/19 205 lb (93 kg)  07/29/18 218 lb 3.2 oz (99 kg)  07/25/18 215 lb (97.5 kg)       ASSESSMENT & PLAN:    1.  Paroxysmal atrial fibrillation He has had some palpitations but no sustained AF He has previously worn monitors which were not helpful. He has occasional chest tightness which he thinks may be due to afib. We discussed monitoring options today including implantable loop recorder. Risks, benefits including infection and bleeding were reviewed with patient today who wishes to proceed.  He has previously declined [place] but may be willing to  reconsider if afib is found on his ILR. ILR is indicated for afib management at this time.  May require better rate control CHADS2VASC is 4    2.  Hypertensive cardiovascular disease BP readings elevated at home recently  Will have patient come by the office to confirm home BP readings when he comes for ILR  3.  Tobacco abuse Cessation advised - he is working on decreasing tobacco   4. TIA Given prior TIA, I think better characterization of his AF burden is advised as above. He has previously worn 30 day monitors and does not feel that he can do this again.   Follow-up:  Will schedule ILR implant at next available time    Patient Risk:  after full review of this patients clinical status, I feel that they are at moderate risk at this time.  Today, I have spent 15 minutes with the patient with telehealth technology discussing arrhythmia management .    Army Fossa, MD  08/25/2019 10:26 AM     [facility] [address] [place] [address] [phone] (office) [phone] (fax)

## 2019-08-25 NOTE — Telephone Encounter (Signed)
Diltiazem refilled as requested.  Sending to scheduler/precert dept to follow up on in office loop implant.  Await reply.

## 2019-08-31 ENCOUNTER — Ambulatory Visit: Payer: Medicare Other | Admitting: Internal Medicine

## 2019-09-04 ENCOUNTER — Ambulatory Visit: Payer: Medicare Other | Admitting: Internal Medicine

## 2019-09-08 DIAGNOSIS — Z0120 Encounter for dental examination and cleaning without abnormal findings: Secondary | ICD-10-CM | POA: Diagnosis not present

## 2019-09-11 ENCOUNTER — Ambulatory Visit: Payer: Medicare Other | Admitting: Internal Medicine

## 2019-09-11 ENCOUNTER — Encounter: Payer: Self-pay | Admitting: *Deleted

## 2019-09-11 ENCOUNTER — Other Ambulatory Visit: Payer: Self-pay

## 2019-09-11 ENCOUNTER — Telehealth: Payer: Self-pay | Admitting: Internal Medicine

## 2019-09-11 ENCOUNTER — Encounter: Payer: Self-pay | Admitting: Internal Medicine

## 2019-09-11 VITALS — BP 146/70 | HR 76 | Ht 72.0 in | Wt 215.0 lb

## 2019-09-11 DIAGNOSIS — I480 Paroxysmal atrial fibrillation: Secondary | ICD-10-CM | POA: Diagnosis not present

## 2019-09-11 DIAGNOSIS — Z720 Tobacco use: Secondary | ICD-10-CM | POA: Diagnosis not present

## 2019-09-11 DIAGNOSIS — I119 Hypertensive heart disease without heart failure: Secondary | ICD-10-CM

## 2019-09-11 DIAGNOSIS — I4891 Unspecified atrial fibrillation: Secondary | ICD-10-CM | POA: Diagnosis not present

## 2019-09-11 DIAGNOSIS — R002 Palpitations: Secondary | ICD-10-CM | POA: Diagnosis not present

## 2019-09-11 HISTORY — PX: OTHER SURGICAL HISTORY: SHX169

## 2019-09-11 MED ORDER — DILTIAZEM HCL 30 MG PO TABS: 30.0000 mg | ORAL_TABLET | Freq: Two times a day (BID) | ORAL | 11 refills | Status: DC | PRN

## 2019-09-11 NOTE — Telephone Encounter (Signed)
*  STAT* If patient is at the pharmacy, call can be transferred to refill team.   1. Which medications need to be refilled? (please list name of each medication and dose if known) Diltiazem  2. Which pharmacy/location (including street and city if local pharmacy) is medication to be sent to? [facility]  3. Do they need a 30 day or 90 day supply?  90 days and refills

## 2019-09-11 NOTE — Progress Notes (Signed)
PCP: Colon Branch, MD   Primary EP: [name] is a 69 y.o. male who presents today for routine electrophysiology followup.  Since last being seen in our clinic, the patient reports doing very well.  He continues to have occasional palpitations.  Today, he denies symptoms of exertional chest pain, shortness of breath,  lower extremity edema, dizziness, presyncope, or syncope.  The patient is otherwise without complaint today.   Past Medical History:  Diagnosis Date  . Erectile dysfunction   . Numbness and tingling    right arm   . Paroxysmal A-fib (HCC)    no recurrence s/p atrial flutter ablation (previously atrial flutter felt to degenerate into afib)  . Psoriasis   . TIA (transient ischemic attack) 2004   Past Surgical History:  Procedure Laterality Date  . A FLUTTER ABLATION     March 2011 at [facility] by Dr Lyanne Co Dixat  . TONSILLECTOMY    . TOTAL HIP ARTHROPLASTY     right    ROS- all systems are reviewed and negatives except as per HPI above  Current Outpatient Medications  Medication Sig Dispense Refill  . aspirin 325 MG tablet Take 325 mg by mouth daily.      Marland Kitchen diltiazem (CARDIZEM) 30 MG tablet Take 1 tablet (30 mg total) by mouth 2 (two) times daily as needed (Palpitations). 60 tablet 11  . flecainide (TAMBOCOR) 100 MG tablet Take 100 mg by mouth 2 (two) times daily as needed.    . fluticasone (FLONASE) 50 MCG/ACT nasal spray Place 2 sprays into both nostrils daily as needed for allergies or rhinitis.     No current facility-administered medications for this visit.    Physical Exam: Vitals:   09/11/19 1050  BP: (!) 146/70  Pulse: 76  Weight: 215 lb (97.5 kg)  Height: 6' (1.829 m)    GEN- The patient is well appearing, alert and oriented x 3 today.   Head- normocephalic, atraumatic Eyes-  Sclera clear, conjunctiva pink Ears- hearing intact Oropharynx- clear Lungs- Clear to ausculation bilaterally, normal work of  breathing Heart- Regular rate and rhythm, no murmurs, rubs or gallops, PMI not laterally displaced GI- soft, NT, ND, + BS Extremities- no clubbing, cyanosis, or edema  Wt Readings from Last 3 Encounters:  09/11/19 215 lb (97.5 kg)  08/25/19 205 lb (93 kg)  07/29/18 218 lb 3.2 oz (99 kg)    EKG tracing ordered today is personally reviewed and shows sinus rhythm with RBBB  Assessment and Plan:  1. Palpitations/ paroxysmal afib He has palpitations of unclear significance with associated chest pain.  He thinks this may be due to afib.  Prior monitors have been unrevealing. He has been reluctant to take [place] due to low AF burden but has had a priro stroke. I would therefore advise implantation of an implantable loop recorder for long term arrhythmia monitoring.  Risks and benefits to ILR were discussed at length with the patient today, including but not limited to risks of bleeding and infection.  Extensive device education was performed.  Remote monitoring was also discussed at length today.  The patient understands and wishes to proceed.  We will proceed at this time with ILR implantation.  2. HTN Stable No change required today He is taking cardizem 30mg  BID We may consider losartan vs daily diltiazem on follow-up  3. Tobacco Cessation advised  Thompson Grayer MD, [facility] 09/11/2019 10:51 AM      DESCRIPTION OF PROCEDURE:  Informed written consent was obtained.  The patient required no sedation for the procedure today.  The patients left chest was prepped and draped. Mapping over the patient's chest was performed to identify the appropriate ILR site.  This area was found to be the left parasternal region over the 3rd-4th intercostal space.  The skin overlying this region was infiltrated with lidocaine for local analgesia.  A 0.5-cm incision was made at the implant site.  A subcutaneous ILR pocket was fashioned using a combination of sharp and blunt dissection.  A Medtronic Reveal Linq model  LNQ 22 (SN [id number]) implantable loop recorder was then placed into the pocket R waves were very prominent and measured > 0.2 mV. EBL<1 ml.  Steri- Strips and a sterile dressing were then applied.  There were no early apparent complications.     CONCLUSIONS:   1. Successful implantation of a Medtronic Reveal LINQ implantable loop recorder for evaluation of palpitations and for afib management  2. No early apparent complications.   Thompson Grayer MD, [facility] 09/11/2019 10:51 AM

## 2019-09-11 NOTE — Patient Instructions (Addendum)
Medication Instructions:  Your physician recommends that you continue on your current medications as directed. Please refer to the Current Medication list given to you today.  Labwork: None ordered.  Testing/Procedures: None ordered.  Follow-Up: Your physician wants you to follow-up in: 6 weeks with a MyChart visit.  October 25, 2019 at 2:15 am MyChart visit with Dr. Rayann Rivas.   Any Other Special Instructions Will Be Listed Below (If Applicable).  If you need a refill on your cardiac medications before your next appointment, please call your pharmacy.    Implantable Loop Recorder Placement, Care After This sheet gives you information about how to care for yourself after your procedure. Your health care provider may also give you more specific instructions. If you have problems or questions, contact your health care provider. What can I expect after the procedure? After the procedure, it is common to have:  Soreness or discomfort near the incision.  Some swelling or bruising near the incision. Follow these instructions at home: Incision care   Follow instructions from your health care provider about how to take care of your incision. Make sure you:  You can remove your outer dressing after 24 hours ? Leave stitches (sutures), skin glue, or adhesive strips in place. These skin closures may need to stay in place for 2 weeks or longer. If adhesive strip edges start to loosen and curl up, you may trim the loose edges. Do not remove adhesive strips completely unless your health care provider tells you to do that.  Check your incision area every day for signs of infection. Check for: ? Redness, swelling, or pain. ? Fluid or blood. ? Warmth. ? Pus or a bad smell.  Do not take baths, swim, or use a hot tub until your health care provider approves. Ask your health care provider if you can take showers. Activity   Return to your normal activities as told by your health care provider.  Ask your health care provider what activities are safe for you. General instructions  Follow instructions from your health care provider about how to manage your implantable loop recorder and transmit the information. Learn how to activate a recording if this is necessary for your type of device.  Do not go through a metal detection gate, and do not let someone hold a metal detector over your chest. Show your ID card.  Do not have an MRI unless you check with your health care provider first.  Take over-the-counter and prescription medicines only as told by your health care provider.  Keep all follow-up visits as told by your health care provider. This is important. Contact a health care provider if:  You have redness, swelling, or pain around your incision.  You have a fever.  You have pain that is not relieved by your pain medicine.  You have triggered your device because of fainting (syncope) or because of a heartbeat that feels like it is racing, slow, fluttering, or skipping (palpitations). Get help right away if you have:  Chest pain.  Difficulty breathing. Summary  After the procedure, it is common to have soreness or discomfort near the incision.  Change your dressing as told by your health care provider.  Follow instructions from your health care provider about how to manage your implantable loop recorder and transmit the information.  Keep all follow-up visits as told by your health care provider. This is important. This information is not intended to replace advice given to you by your health care provider.  Make sure you discuss any questions you have with your health care provider. Document Revised: 08/14/2017 Document Reviewed: 08/14/2017 Elsevier Patient Education  2020 [employer].

## 2019-09-14 ENCOUNTER — Telehealth: Payer: Self-pay | Admitting: Emergency Medicine

## 2019-09-14 NOTE — Telephone Encounter (Signed)
Alert received for AF episodes and AF burden of 13.2%.  5 episodes of AF since 09/11/19 with longest episode lasting 4 hours. Dr Rayann Heman notified of AF burden and episodes. Patient to f/u as scheduled on 10/26/19 unless he is symptomatic.Per Dr Rayann Heman AF alerts to be adjusted to longest duration. Patient reports he is asymptomatic and will follow up as scheduled . Voiced issue with getting cardiazem refill without insurance coverage due to increased dose to 100 mg BID. Checked Dr Jackalyn Lombard last office note and patient to take Cardizem 30 mg BID. Educated patient on documented cardizem dose of 30 mg BID and given instructions to call office if he has symptoms in order to move up date of follow up.

## 2019-09-15 ENCOUNTER — Telehealth: Payer: Self-pay | Admitting: Internal Medicine

## 2019-09-15 NOTE — Telephone Encounter (Signed)
Linq alert- Symptomatic AF episode.  Pt with history of ablation >10 years ago.  States symptoms during last night episode were chest discomfort. Pt states that the symptoms are different from the symptoms he had prior to ablation.  Pt confirmed he is taking Diltiazem 30mg  BID as ordered.  Rescheduled pt virtual visit from April to 09/18/19.

## 2019-09-15 NOTE — Telephone Encounter (Signed)
The pt states he had an episode last night and wanted to know if it showed up on his transmission. I told him we did get a transmission from his monitor but the nurse have not reviewed it yet. I told him as soon as the nurse review it she will give him a call back.

## 2019-09-15 NOTE — Telephone Encounter (Signed)
New message   Pt c/o medication issue:  1. Name of Medication: diltiazem (CARDIZEM) 30 MG tablet  2. How are you currently taking this medication (dosage and times per day)? As written  3. Are you having a reaction (difficulty breathing--STAT)? No   4. What is your medication issue? Patient's wife has questions about the dosage of this medication. Please advise.

## 2019-09-18 ENCOUNTER — Telehealth: Payer: Self-pay

## 2019-09-18 ENCOUNTER — Telehealth (INDEPENDENT_AMBULATORY_CARE_PROVIDER_SITE_OTHER): Payer: Medicare Other | Admitting: Internal Medicine

## 2019-09-18 VITALS — Ht 72.0 in | Wt 210.0 lb

## 2019-09-18 DIAGNOSIS — I480 Paroxysmal atrial fibrillation: Secondary | ICD-10-CM

## 2019-09-18 DIAGNOSIS — I119 Hypertensive heart disease without heart failure: Secondary | ICD-10-CM

## 2019-09-18 DIAGNOSIS — D6869 Other thrombophilia: Secondary | ICD-10-CM | POA: Diagnosis not present

## 2019-09-18 DIAGNOSIS — Z720 Tobacco use: Secondary | ICD-10-CM | POA: Diagnosis not present

## 2019-09-18 MED ORDER — DILTIAZEM HCL ER COATED BEADS 120 MG PO CP24: 120.0000 mg | ORAL_CAPSULE | Freq: Every day | ORAL | 3 refills | Status: DC

## 2019-09-18 MED ORDER — RIVAROXABAN 20 MG PO TABS: 20.0000 mg | ORAL_TABLET | Freq: Every day | ORAL | 11 refills | Status: DC

## 2019-09-18 NOTE — Progress Notes (Signed)
Electrophysiology TeleHealth Note   Due to national recommendations of social distancing due to [place] 19, an audio  telehealth visit is felt to be most appropriate for this patient at this time.  See MyChart message from today for the patient's consent to telehealth for [facility].  Date:  09/18/2019   ID:  Lawrence Rivas, DOB Feb 03, 1951, MRN [id number]  Location: patient's home  Provider location:  [facility]  Evaluation Performed: Follow-up visit  PCP:  Colon Branch, MD   Electrophysiologist:  Lawrence Rivas  Chief Complaint:  palpitations  History of Present Illness:    Lawrence Rivas is a 69 y.o. male who presents via telehealth conferencing today.  Since last being seen in our clinic, the patient reports doing reasonably well.  His recently placed ILR has documented afib with RVR.  V rates during afib are very high (average 160 bpm and max of 260 bpm).  + palpitations and fatigue. No further chest pain.  Today, he denies symptoms of shortness of breath,  lower extremity edema, dizziness, presyncope, or syncope.  The patient is otherwise without complaint today.  The patient denies symptoms of fevers, chills, cough, or new SOB worrisome for COVID 19.  Past Medical History:  Diagnosis Date  . Erectile dysfunction   . Numbness and tingling    right arm   . Paroxysmal A-fib (HCC)    no recurrence s/p atrial flutter ablation (previously atrial flutter felt to degenerate into afib)  . Psoriasis   . TIA (transient ischemic attack) 2004    Past Surgical History:  Procedure Laterality Date  . A FLUTTER ABLATION     March 2011 at [facility] by Lawrence Lyanne Co Dixat  . implantable loop recorder insertion  09/11/2019   Medtronic Reveal Linq model LNQ 22 (SN [id number]) implantable loop recorder implanted by Lawrence Rivas for afib management and evaluation of palpitations  . TONSILLECTOMY    . TOTAL HIP ARTHROPLASTY     right    Current Outpatient Medications    Medication Sig Dispense Refill  . aspirin 325 MG tablet Take 325 mg by mouth daily.      Marland Kitchen diltiazem (CARDIZEM) 30 MG tablet Take 1 tablet (30 mg total) by mouth 2 (two) times daily as needed (Palpitations). 60 tablet 11  . fluticasone (FLONASE) 50 MCG/ACT nasal spray Place 2 sprays into both nostrils daily as needed for allergies or rhinitis.     No current facility-administered medications for this visit.    Allergies:   Watermelon [citrullus vulgaris] and Coconut oil   Social History:  The patient  reports that he has been smoking cigarettes. He has been smoking about 1.00 pack per day. He has never used smokeless tobacco. He reports current alcohol use. He reports that he does not use drugs.   ROS:  Please see the history of present illness.   All other systems are personally reviewed and negative.    Exam:    Vital Signs:  Ht 6' (1.829 m)   Wt 210 lb (95.3 kg)   BMI 28.48 kg/m   Well sounding, alert and conversant   Labs/Other Tests and Data Reviewed:    Recent Labs: No results found for requested labs within last 8760 hours.   Wt Readings from Last 3 Encounters:  09/18/19 210 lb (95.3 kg)  09/11/19 215 lb (97.5 kg)  08/25/19 205 lb (93 kg)     Last device remote is reviewed from [place] PDF which reveals  normal device function, afib with rapid RVR   ASSESSMENT & PLAN:    1.  Paroxysmal atrial fibrillation ILR has documented afib with extremely fast V rates.  I have discussed at length with the patient and his spouse today.  Start diltiazem CD 120mg  daily.  He can continue to use short acting diltiazem 30mg  1-2 tabs q4h prn. His chads2vasc score is 3 and includes prior TIA.  We discussed at length today.  We will stop ASA and start xarelto 20mg  daily. We will continue to follow AF burden on ILR and consider further rate control adjustment as needed.  Ultimately, we may need to consider flecainide vs ablation in the future. Of note he had atrial flutter ablation in  2011 in [place].  2. HTN Stable No change required today  3. Tobacco Cessation is advised  4. Chest pain Likely due to AF with RVR Rate control as above   Follow-up:  4 weeks with me   Patient Risk:  after full review of this patients clinical status, I feel that they are at moderate risk at this time.  Today, I have spent 15 minutes with the patient with telehealth technology discussing arrhythmia management .    SignedThompson Grayer, MD  09/18/2019 2:52 PM     [place] [place] [address] [phone] (office) [phone] (fax)

## 2019-09-18 NOTE — Telephone Encounter (Signed)
-----   Message from Thompson Grayer, MD sent at 09/18/2019  3:04 PM EST ----- Start diltiazem CD 120mg  daily  Stop ASA Start xarelto 20mg  daily  Follow-up with me in 4 weeks.

## 2019-09-20 NOTE — Telephone Encounter (Signed)
Call placed to Pt.  Confirmed medication changes.  Advised of 4 wk f/u appt  Pt unable to do mychart video last visit/will add phone number to notes.

## 2019-09-29 ENCOUNTER — Telehealth: Payer: Self-pay | Admitting: Student

## 2019-09-29 NOTE — Telephone Encounter (Signed)
  Called to discuss carelink alert for AF with RVR + symptoms.   Pt did have episode of AF with RVR in 160s last night for approx 3 hours. Broke after 1 dose of prn diltiazem  He does feel like episodes have decreased in frequency and severity since starting diltiazem.  Will plan to continue current dose for now, but let him know that if frequency increased we can increase the long acting diltiazem to start.   Questions answered. Keep follow up as scheduled.   Legrand Como [address]" [place], PA-C  09/29/2019 10:38 AM

## 2019-10-12 ENCOUNTER — Ambulatory Visit (INDEPENDENT_AMBULATORY_CARE_PROVIDER_SITE_OTHER): Payer: Medicare Other | Admitting: *Deleted

## 2019-10-12 DIAGNOSIS — I4891 Unspecified atrial fibrillation: Secondary | ICD-10-CM

## 2019-10-12 DIAGNOSIS — I480 Paroxysmal atrial fibrillation: Secondary | ICD-10-CM | POA: Diagnosis not present

## 2019-10-13 LAB — CUP PACEART REMOTE DEVICE CHECK
Date Time Interrogation Session: 20210402181943
Implantable Pulse Generator Implant Date: 20210301

## 2019-10-16 ENCOUNTER — Telehealth: Payer: Self-pay

## 2019-10-16 NOTE — Telephone Encounter (Signed)
Patient called wanting to know if there are any changes to his medication of if he needs to wait until his 4/12 appt.

## 2019-10-16 NOTE — Telephone Encounter (Signed)
Linq alert recieved for symptomatic AF episode.  This is known issue, that pt has f/u scheduled 4/12.  Current meds include Diltaizem 120mg  daily, with PRN dosing of 30mg  BID as needed.  Pt is on [place], Xarelto.   Spoke with pt, his symptoms were as usual.  An uneasiness starting in his chest and spreading to his shoulders and arms.  He could feel fast/ racing heart.  Prior to even he was just sitting on couch watching TV,  He reports he took PRN diltiazem and was starting to feel better upon going to bed about an hour later but felt it was not completely resolved.  At time of call pt is asymptomatic.    Pt also states, he has had an issue as of lately with his arthritic pain.  The pain was better managed when he was on ASA, but since starting Xarelto and stopping ASA the pain has not been controlled well with Tylenol.  Pt wondering if there is anything else he can take.    Advised pt I would forward to Dr. Rayann Heman for review in case there are changes to be made prior to appt on 4/12.

## 2019-10-21 NOTE — Telephone Encounter (Signed)
Increase diltiazem to 240 mg daily

## 2019-10-23 ENCOUNTER — Encounter: Payer: Self-pay | Admitting: Internal Medicine

## 2019-10-23 ENCOUNTER — Telehealth (INDEPENDENT_AMBULATORY_CARE_PROVIDER_SITE_OTHER): Payer: Medicare Other | Admitting: Internal Medicine

## 2019-10-23 ENCOUNTER — Other Ambulatory Visit: Payer: Self-pay

## 2019-10-23 VITALS — BP 161/77 | HR 84 | Ht 72.0 in | Wt 207.0 lb

## 2019-10-23 DIAGNOSIS — I480 Paroxysmal atrial fibrillation: Secondary | ICD-10-CM | POA: Diagnosis not present

## 2019-10-23 DIAGNOSIS — Z720 Tobacco use: Secondary | ICD-10-CM

## 2019-10-23 DIAGNOSIS — I119 Hypertensive heart disease without heart failure: Secondary | ICD-10-CM

## 2019-10-23 DIAGNOSIS — R079 Chest pain, unspecified: Secondary | ICD-10-CM

## 2019-10-23 MED ORDER — DILTIAZEM HCL ER COATED BEADS 240 MG PO CP24: 240.0000 mg | ORAL_CAPSULE | Freq: Every day | ORAL | 3 refills | Status: DC

## 2019-10-23 NOTE — Progress Notes (Signed)
Electrophysiology TeleHealth Note  Due to national recommendations of social distancing due to [place] 19, an audio telehealth visit is felt to be most appropriate for this patient at this time.  Verbal consent was obtained by me for the telehealth visit today.  The patient does not have capability for a virtual visit.  A phone visit is therefore required today.   Date:  10/23/2019   ID:  Lawrence Rivas, DOB 07-23-1950, MRN [id number]  Location: patient's home  Provider location:  [place]  Evaluation Performed: Follow-up visit  PCP:  Colon Branch, MD   Electrophysiologist:  Dr Rayann Heman  Chief Complaint:  AF follow up  History of Present Illness:    Lawrence Rivas is a 69 y.o. male who presents via telehealth conferencing today.  Since last being seen in our clinic, the patient reports doing very well.  Today, he denies symptoms of palpitations, chest pain, shortness of breath,  lower extremity edema, dizziness, presyncope, or syncope.  The patient is otherwise without complaint today.  The patient denies symptoms of fevers, chills, cough, or new SOB worrisome for COVID 19.  Past Medical History:  Diagnosis Date  . Erectile dysfunction   . Numbness and tingling    right arm   . Paroxysmal A-fib (HCC)    no recurrence s/p atrial flutter ablation (previously atrial flutter felt to degenerate into afib)  . Psoriasis   . TIA (transient ischemic attack) 2004    Past Surgical History:  Procedure Laterality Date  . A FLUTTER ABLATION     March [facility] at [facility] by Dr Lyanne Co Dixat  . implantable loop recorder insertion  09/11/2019   Medtronic Reveal Linq model LNQ 22 (SN [id number]) implantable loop recorder implanted by Dr Rayann Heman for afib management and evaluation of palpitations  . TONSILLECTOMY    . TOTAL HIP ARTHROPLASTY     right    Current Outpatient Medications  Medication Sig Dispense Refill  . diltiazem (CARDIZEM CD) 120 MG 24 hr capsule Take 1  capsule (120 mg total) by mouth daily. 90 capsule 3  . diltiazem (CARDIZEM) 30 MG tablet Take 1 tablet (30 mg total) by mouth 2 (two) times daily as needed (Palpitations). 60 tablet 11  . fluticasone (FLONASE) 50 MCG/ACT nasal spray Place 2 sprays into both nostrils daily as needed for allergies or rhinitis.    . rivaroxaban (XARELTO) 20 MG TABS tablet Take 1 tablet (20 mg total) by mouth daily with supper. 30 tablet 11   No current facility-administered medications for this visit.    Allergies:   Watermelon [citrullus vulgaris] and Coconut oil   Social History:  The patient  reports that he has been smoking cigarettes. He has been smoking about 1.00 pack per day. He has never used smokeless tobacco. He reports current alcohol use. He reports that he does not use drugs.   Family History:  The patient's  family history includes Coronary artery disease (age of onset: 17) in his father; Diabetes in his maternal grandmother; Stroke in his mother.   ROS:  Please see the history of present illness.   All other systems are personally reviewed and negative.    Exam:    Vital Signs:  BP (!) 161/77   Pulse 84   Ht 6' (1.829 m)   Wt 207 lb (93.9 kg)   BMI 28.07 kg/m   Well sounding, alert and conversant   Labs/Other Tests and Data Reviewed:    Recent  Labs: No results found for requested labs within last 8760 hours.   Wt Readings from Last 3 Encounters:  10/23/19 207 lb (93.9 kg)  09/18/19 210 lb (95.3 kg)  09/11/19 215 lb (97.5 kg)     Last device remote is reviewed from [place] PDF which reveals normal device function, 7.7% AF burden with RVR   ASSESSMENT & PLAN:    1.  Paroxysmal atrial fibrillation AF burden by ILR 7.7% V rates elevated when in AF and correspond to patient symptoms Increase Diltiazem to 240mg  daily Continue Xarelto for CHADS2VASC of 3 He was previously on Flecainide but this was discontinued 2/2 pre-syncope.  The patient has symptomatic, recurrent paroxysmal  atrial fibrillation. he has failed medical therapy with Flecainide. Therapeutic strategies for afib including medicine and ablation were discussed in detail with the patient today. Risk, benefits, and alternatives to EP study and radiofrequency ablation for afib were also discussed in detail today. These risks include but are not limited to stroke, bleeding, vascular damage, tamponade, perforation, damage to the esophagus, lungs, and other structures, pulmonary vein stenosis, worsening renal function, and death. The patient understands these risk and would like to discuss further with his wife prior to scheduling. If he decides to proceed with ablation, he will call our office.  Carto, ICE, anesthesia are requested for the procedure.  Will also obtain cardiac CT prior to the procedure to exclude LAA thrombus and further evaluate atrial anatomy.  2.  HTN Stable No change required today  3.  Tobacco abuse Cessation advised  4.  Chest pain Likely 2/2 AF with RVR Myoview normal in 2020   Follow-up:  With me in 6 weeks   Patient Risk:  after full review of this patients clinical status, I feel that they are at moderate risk at this time.  Today, I have spent 15 minutes with the patient with telehealth technology discussing arrhythmia management .    Army Fossa, MD  10/23/2019 2:30 PM     [place] [address] [phone] (office) [phone] (fax)

## 2019-10-23 NOTE — H&P (View-Only) (Signed)
Electrophysiology TeleHealth Note  Due to national recommendations of social distancing due to [place] 19, an audio telehealth visit is felt to be most appropriate for this patient at this time.  Verbal consent was obtained by me for the telehealth visit today.  The patient does not have capability for a virtual visit.  A phone visit is therefore required today.   Date:  10/23/2019   ID:  Lawrence Rivas, DOB 1950-10-20, MRN [id number]  Location: patient's home  Provider location:  [place]  Evaluation Performed: Follow-up visit  PCP:  Colon Branch, MD   Electrophysiologist:  Dr Rayann Heman  Chief Complaint:  AF follow up  History of Present Illness:    Lawrence Rivas is a 69 y.o. male who presents via telehealth conferencing today.  Since last being seen in our clinic, the patient reports doing very well.  Today, he denies symptoms of palpitations, chest pain, shortness of breath,  lower extremity edema, dizziness, presyncope, or syncope.  The patient is otherwise without complaint today.  The patient denies symptoms of fevers, chills, cough, or new SOB worrisome for COVID 19.  Past Medical History:  Diagnosis Date  . Erectile dysfunction   . Numbness and tingling    right arm   . Paroxysmal A-fib (HCC)    no recurrence s/p atrial flutter ablation (previously atrial flutter felt to degenerate into afib)  . Psoriasis   . TIA (transient ischemic attack) 2004    Past Surgical History:  Procedure Laterality Date  . A FLUTTER ABLATION     March 2011 at [facility] by Dr Lyanne Co Dixat  . implantable loop recorder insertion  09/11/2019   Medtronic Reveal Linq model LNQ 22 (SN [id number]) implantable loop recorder implanted by Dr Rayann Heman for afib management and evaluation of palpitations  . TONSILLECTOMY    . TOTAL HIP ARTHROPLASTY     right    Current Outpatient Medications  Medication Sig Dispense Refill  . diltiazem (CARDIZEM CD) 120 MG 24 hr capsule Take 1  capsule (120 mg total) by mouth daily. 90 capsule 3  . diltiazem (CARDIZEM) 30 MG tablet Take 1 tablet (30 mg total) by mouth 2 (two) times daily as needed (Palpitations). 60 tablet 11  . fluticasone (FLONASE) 50 MCG/ACT nasal spray Place 2 sprays into both nostrils daily as needed for allergies or rhinitis.    . rivaroxaban (XARELTO) 20 MG TABS tablet Take 1 tablet (20 mg total) by mouth daily with supper. 30 tablet 11   No current facility-administered medications for this visit.    Allergies:   Watermelon [citrullus vulgaris] and Coconut oil   Social History:  The patient  reports that he has been smoking cigarettes. He has been smoking about 1.00 pack per day. He has never used smokeless tobacco. He reports current alcohol use. He reports that he does not use drugs.   Family History:  The patient's  family history includes Coronary artery disease (age of onset: 53) in his father; Diabetes in his maternal grandmother; Stroke in his mother.   ROS:  Please see the history of present illness.   All other systems are personally reviewed and negative.    Exam:    Vital Signs:  BP (!) 161/77   Pulse 84   Ht 6' (1.829 m)   Wt 207 lb (93.9 kg)   BMI 28.07 kg/m   Well sounding, alert and conversant   Labs/Other Tests and Data Reviewed:    Recent  Labs: No results found for requested labs within last 8760 hours.   Wt Readings from Last 3 Encounters:  10/23/19 207 lb (93.9 kg)  09/18/19 210 lb (95.3 kg)  09/11/19 215 lb (97.5 kg)     Last device remote is reviewed from [place] PDF which reveals normal device function, 7.7% AF burden with RVR   ASSESSMENT & PLAN:    1.  Paroxysmal atrial fibrillation AF burden by ILR 7.7% V rates elevated when in AF and correspond to patient symptoms Increase Diltiazem to 240mg  daily Continue Xarelto for CHADS2VASC of 3 He was previously on Flecainide but this was discontinued 2/2 pre-syncope.  The patient has symptomatic, recurrent paroxysmal  atrial fibrillation. he has failed medical therapy with Flecainide. Therapeutic strategies for afib including medicine and ablation were discussed in detail with the patient today. Risk, benefits, and alternatives to EP study and radiofrequency ablation for afib were also discussed in detail today. These risks include but are not limited to stroke, bleeding, vascular damage, tamponade, perforation, damage to the esophagus, lungs, and other structures, pulmonary vein stenosis, worsening renal function, and death. The patient understands these risk and would like to discuss further with his wife prior to scheduling. If he decides to proceed with ablation, he will call our office.  Carto, ICE, anesthesia are requested for the procedure.  Will also obtain cardiac CT prior to the procedure to exclude LAA thrombus and further evaluate atrial anatomy.  2.  HTN Stable No change required today  3.  Tobacco abuse Cessation advised  4.  Chest pain Likely 2/2 AF with RVR Myoview normal in 2020   Follow-up:  With me in 6 weeks   Patient Risk:  after full review of this patients clinical status, I feel that they are at moderate risk at this time.  Today, I have spent 15 minutes with the patient with telehealth technology discussing arrhythmia management .    Army Fossa, MD  10/23/2019 2:30 PM     [place] [address] [phone] (office) [phone] (fax)

## 2019-10-24 ENCOUNTER — Telehealth: Payer: Self-pay | Admitting: Internal Medicine

## 2019-10-24 DIAGNOSIS — I4891 Unspecified atrial fibrillation: Secondary | ICD-10-CM

## 2019-10-24 NOTE — Telephone Encounter (Signed)
New message  Pt said that he would like to proceed with scheduling an ablation.

## 2019-10-25 ENCOUNTER — Telehealth: Payer: Medicare Other | Admitting: Internal Medicine

## 2019-10-25 ENCOUNTER — Telehealth: Payer: Self-pay | Admitting: Internal Medicine

## 2019-10-25 ENCOUNTER — Telehealth: Payer: Self-pay

## 2019-10-25 DIAGNOSIS — I480 Paroxysmal atrial fibrillation: Secondary | ICD-10-CM

## 2019-10-25 DIAGNOSIS — Z01810 Encounter for preprocedural cardiovascular examination: Secondary | ICD-10-CM

## 2019-10-25 MED ORDER — METOPROLOL TARTRATE 50 MG PO TABS: ORAL_TABLET | ORAL | 0 refills | Status: DC

## 2019-10-25 NOTE — Telephone Encounter (Signed)
Attempted to call Pt again and went to VM.

## 2019-10-25 NOTE — Telephone Encounter (Signed)
Called pt and he agreed to and verbalized understanding for his Afib Ablation with Dr. Rayann Heman 11/07/19.   Pt says to use his home number and talk with his wife for further calls [phone].

## 2019-10-25 NOTE — Telephone Encounter (Signed)
Attempted to call Pt x 2.  Left message.  PLEASE NOTIFY NURSE WHEN PT CALLS AND TRANSFER

## 2019-10-25 NOTE — Telephone Encounter (Deleted)
Pt agreed and verbalized understanding for Afib Ablation with Dr. Rayann Heman for 11/07/19.   He asks for all calls to made to his wife at home: [phone].

## 2019-10-25 NOTE — Telephone Encounter (Signed)
Duplicate call.  Closing.

## 2019-10-25 NOTE — Telephone Encounter (Signed)
New message   Patient states that he would like to get on surgical schedule for an ablation. Please call to discuss.

## 2019-10-31 ENCOUNTER — Other Ambulatory Visit: Payer: Medicare Other

## 2019-10-31 ENCOUNTER — Other Ambulatory Visit: Payer: Self-pay

## 2019-10-31 ENCOUNTER — Telehealth (HOSPITAL_COMMUNITY): Payer: Self-pay | Admitting: Emergency Medicine

## 2019-10-31 ENCOUNTER — Encounter (HOSPITAL_COMMUNITY): Payer: Self-pay

## 2019-10-31 DIAGNOSIS — I480 Paroxysmal atrial fibrillation: Secondary | ICD-10-CM

## 2019-10-31 DIAGNOSIS — Z01810 Encounter for preprocedural cardiovascular examination: Secondary | ICD-10-CM

## 2019-10-31 LAB — BASIC METABOLIC PANEL
BUN/Creatinine Ratio: 24 (ref 10–24)
BUN: 21 mg/dL (ref 8–27)
CO2: 29 mmol/L (ref 20–29)
Calcium: 9.3 mg/dL (ref 8.6–10.2)
Chloride: 103 mmol/L (ref 96–106)
Creatinine, Ser: 0.89 mg/dL (ref 0.76–1.27)
GFR calc Af Amer: 101 mL/min/{1.73_m2} (ref >59)
GFR calc non Af Amer: 87 mL/min/{1.73_m2} (ref >59)
Glucose: 102 mg/dL — ABNORMAL HIGH (ref 65–99)
Potassium: 4.6 mmol/L (ref 3.5–5.2)
Sodium: 134 mmol/L (ref 134–144)

## 2019-10-31 LAB — CBC WITH DIFFERENTIAL/PLATELET
Basophils Absolute: 0 10*3/uL (ref 0.0–0.2)
Basos: 0 %
EOS (ABSOLUTE): 0.1 10*3/uL (ref 0.0–0.4)
Eos: 2 %
Hematocrit: 40.8 % (ref 37.5–51.0)
Hemoglobin: 13.9 g/dL (ref 13.0–17.7)
Lymphocytes Absolute: 2.1 10*3/uL (ref 0.7–3.1)
Lymphs: 34 %
MCH: 34 pg — ABNORMAL HIGH (ref 26.6–33.0)
MCHC: 34.1 g/dL (ref 31.5–35.7)
MCV: 100 fL — ABNORMAL HIGH (ref 79–97)
Monocytes Absolute: 0.7 10*3/uL (ref 0.1–0.9)
Monocytes: 12 %
Neutrophils Absolute: 3.2 10*3/uL (ref 1.4–7.0)
Neutrophils: 52 %
Platelets: 206 10*3/uL (ref 150–450)
RBC: 4.09 x10E6/uL — ABNORMAL LOW (ref 4.14–5.80)
RDW: 13.4 % (ref 11.6–15.4)
WBC: 6.2 10*3/uL (ref 3.4–10.8)

## 2019-10-31 NOTE — Telephone Encounter (Signed)
Attempted to call patient regarding upcoming cardiac CT appointment. °Left message on voicemail with name and callback number °Jaquanna Ballentine RN Navigator Cardiac Imaging °[facility] °[phone] Office °[phone] Cell ° °

## 2019-10-31 NOTE — Telephone Encounter (Signed)
Pt coming in for lab work today-will meet and go over instructions.  Spoke with wife on phone and answered some of her questions.

## 2019-11-01 ENCOUNTER — Ambulatory Visit (HOSPITAL_COMMUNITY)
Admission: RE | Admit: 2019-11-01 | Discharge: 2019-11-01 | Disposition: A | Discharge status: Home / Self Care | Payer: Medicare Other | Source: Ambulatory Visit | Attending: Internal Medicine | Admitting: Internal Medicine

## 2019-11-01 DIAGNOSIS — I4891 Unspecified atrial fibrillation: Secondary | ICD-10-CM | POA: Insufficient documentation

## 2019-11-01 MED ORDER — IOHEXOL 350 MG/ML SOLN
80.0000 mL | Freq: Once | INTRAVENOUS | Status: AC | PRN
  Administered 2019-11-01: 80 mL via INTRAVENOUS

## 2019-11-02 ENCOUNTER — Telehealth: Payer: Self-pay | Admitting: Internal Medicine

## 2019-11-02 NOTE — Telephone Encounter (Signed)
   Pt said he has prep questions regarding upcoming procedure.   Please call

## 2019-11-02 NOTE — Telephone Encounter (Signed)
Returned call to Pt.  Spoke with wife.    Pt is concerned about being able to smoke.  Question is can he have a cigarette the morning of his procedure.  Reminded that Pt CANNOT eat or drink ANYTHING after midnight prior to procedure.  Discussed with Dr. Rayann Heman.  Per Dr. Rayann Heman OK for Pt to put on a nicotine patch prior to his surgery.  Wife relieved-advised Pt is very difficult when he can't smoke.

## 2019-11-04 ENCOUNTER — Other Ambulatory Visit (HOSPITAL_COMMUNITY)
Admission: RE | Admit: 2019-11-04 | Discharge: 2019-11-04 | Disposition: A | Discharge status: Home / Self Care | Payer: Medicare Other | Source: Ambulatory Visit | Attending: Internal Medicine | Admitting: Internal Medicine

## 2019-11-04 DIAGNOSIS — Z20822 Contact with and (suspected) exposure to covid-19: Secondary | ICD-10-CM | POA: Insufficient documentation

## 2019-11-04 DIAGNOSIS — Z01812 Encounter for preprocedural laboratory examination: Secondary | ICD-10-CM | POA: Diagnosis not present

## 2019-11-04 LAB — SARS CORONAVIRUS 2 (TAT 6-24 HRS): SARS Coronavirus 2: NEGATIVE

## 2019-11-06 ENCOUNTER — Encounter (HOSPITAL_COMMUNITY): Payer: Self-pay | Admitting: Internal Medicine

## 2019-11-06 NOTE — Anesthesia Preprocedure Evaluation (Addendum)
Anesthesia Evaluation  Patient identified by MRN, date of birth, ID band Patient awake    Reviewed: Allergy & Precautions, NPO status   Airway Mallampati: II  TM Distance: >3 FB     Dental   Pulmonary Current Smoker and Patient abstained from smoking.,    breath sounds clear to auscultation       Cardiovascular negative cardio ROS   Rhythm:Regular Rate:Normal     Neuro/Psych TIA   GI/Hepatic negative GI ROS, Neg liver ROS,   Endo/Other  negative endocrine ROS  Renal/GU negative Renal ROS     Musculoskeletal   Abdominal   Peds  Hematology   Anesthesia Other Findings   Reproductive/Obstetrics                            Anesthesia Physical Anesthesia Plan  ASA: III  Anesthesia Plan: General   Post-op Pain Management:    Induction: Intravenous  PONV Risk Score and Plan: 2 and Ondansetron, Dexamethasone and Midazolam  Airway Management Planned: Oral ETT  Additional Equipment:   Intra-op Plan:   Post-operative Plan:   Informed Consent: I have reviewed the patients History and Physical, chart, labs and discussed the procedure including the risks, benefits and alternatives for the proposed anesthesia with the patient or authorized representative who has indicated his/her understanding and acceptance.     Dental advisory given  Plan Discussed with: CRNA and Anesthesiologist  Anesthesia Plan Comments:         Anesthesia Quick Evaluation

## 2019-11-07 ENCOUNTER — Other Ambulatory Visit: Payer: Self-pay

## 2019-11-07 ENCOUNTER — Ambulatory Visit (HOSPITAL_COMMUNITY)
Admission: RE | Admit: 2019-11-07 | Discharge: 2019-11-07 | Disposition: A | Discharge status: Home / Self Care | Payer: Medicare Other | Source: Ambulatory Visit | Attending: Internal Medicine | Admitting: Internal Medicine

## 2019-11-07 ENCOUNTER — Encounter (HOSPITAL_COMMUNITY)
Admission: RE | Disposition: A | Discharge status: Home / Self Care | Payer: Self-pay | Source: Ambulatory Visit | Attending: Internal Medicine

## 2019-11-07 ENCOUNTER — Ambulatory Visit (HOSPITAL_COMMUNITY): Payer: Medicare Other | Admitting: Certified Registered Nurse Anesthetist

## 2019-11-07 ENCOUNTER — Other Ambulatory Visit: Payer: Self-pay | Admitting: Internal Medicine

## 2019-11-07 DIAGNOSIS — Z8673 Personal history of transient ischemic attack (TIA), and cerebral infarction without residual deficits: Secondary | ICD-10-CM | POA: Insufficient documentation

## 2019-11-07 DIAGNOSIS — Z8249 Family history of ischemic heart disease and other diseases of the circulatory system: Secondary | ICD-10-CM | POA: Insufficient documentation

## 2019-11-07 DIAGNOSIS — Z79899 Other long term (current) drug therapy: Secondary | ICD-10-CM | POA: Diagnosis not present

## 2019-11-07 DIAGNOSIS — R079 Chest pain, unspecified: Secondary | ICD-10-CM | POA: Insufficient documentation

## 2019-11-07 DIAGNOSIS — I10 Essential (primary) hypertension: Secondary | ICD-10-CM | POA: Insufficient documentation

## 2019-11-07 DIAGNOSIS — F17210 Nicotine dependence, cigarettes, uncomplicated: Secondary | ICD-10-CM | POA: Insufficient documentation

## 2019-11-07 DIAGNOSIS — J309 Allergic rhinitis, unspecified: Secondary | ICD-10-CM | POA: Diagnosis not present

## 2019-11-07 DIAGNOSIS — Z7901 Long term (current) use of anticoagulants: Secondary | ICD-10-CM | POA: Diagnosis not present

## 2019-11-07 DIAGNOSIS — I480 Paroxysmal atrial fibrillation: Secondary | ICD-10-CM | POA: Diagnosis not present

## 2019-11-07 HISTORY — PX: ATRIAL FIBRILLATION ABLATION: EP1191

## 2019-11-07 LAB — POCT ACTIVATED CLOTTING TIME
Activated Clotting Time: 362 seconds
Activated Clotting Time: 268 seconds

## 2019-11-07 SURGERY — ATRIAL FIBRILLATION ABLATION
Anesthesia: General

## 2019-11-07 MED ORDER — SODIUM CHLORIDE 0.9 % IV SOLN: INTRAVENOUS | Status: DC

## 2019-11-07 MED ORDER — DEXAMETHASONE SODIUM PHOSPHATE 10 MG/ML IJ SOLN
INTRAMUSCULAR | Status: DC | PRN
  Administered 2019-11-07: 10 mg via INTRAVENOUS

## 2019-11-07 MED ORDER — HEPARIN SODIUM (PORCINE) 1000 UNIT/ML IJ SOLN
INTRAMUSCULAR | Status: DC | PRN
  Administered 2019-11-07: 14000 [IU] via INTRAVENOUS
  Administered 2019-11-07: 1000 [IU] via INTRAVENOUS

## 2019-11-07 MED ORDER — SODIUM CHLORIDE 0.9% FLUSH: 3.0000 mL | INTRAVENOUS | Status: DC | PRN

## 2019-11-07 MED ORDER — ONDANSETRON HCL 4 MG/2ML IJ SOLN
INTRAMUSCULAR | Status: DC | PRN
  Administered 2019-11-07: 4 mg via INTRAVENOUS

## 2019-11-07 MED ORDER — ACETAMINOPHEN 325 MG PO TABS
650.0000 mg | ORAL_TABLET | ORAL | Status: DC | PRN
  Administered 2019-11-07: 650 mg via ORAL
  Filled 2019-11-07: qty 2

## 2019-11-07 MED ORDER — LIDOCAINE 2% (20 MG/ML) 5 ML SYRINGE
INTRAMUSCULAR | Status: DC | PRN
  Administered 2019-11-07: 100 mg via INTRAVENOUS

## 2019-11-07 MED ORDER — ONDANSETRON HCL 4 MG/2ML IJ SOLN: 4.0000 mg | Freq: Four times a day (QID) | INTRAMUSCULAR | Status: DC | PRN

## 2019-11-07 MED ORDER — ISOPROTERENOL HCL 0.2 MG/ML IJ SOLN
INTRAMUSCULAR | Status: AC
  Filled 2019-11-07: qty 5

## 2019-11-07 MED ORDER — HEPARIN (PORCINE) IN NACL 2-0.9 UNITS/ML
INTRAMUSCULAR | Status: AC | PRN
  Administered 2019-11-07: 500 mL

## 2019-11-07 MED ORDER — HEPARIN SODIUM (PORCINE) 1000 UNIT/ML IJ SOLN
INTRAMUSCULAR | Status: DC | PRN
  Administered 2019-11-07: 3000 [IU] via INTRAVENOUS

## 2019-11-07 MED ORDER — SUGAMMADEX SODIUM 200 MG/2ML IV SOLN
INTRAVENOUS | Status: DC | PRN
  Administered 2019-11-07: 200 mg via INTRAVENOUS

## 2019-11-07 MED ORDER — PANTOPRAZOLE SODIUM 40 MG PO TBEC
40.0000 mg | DELAYED_RELEASE_TABLET | Freq: Every day | ORAL | 0 refills | Status: DC
Start: 2019-11-07 — End: 2019-11-08

## 2019-11-07 MED ORDER — FENTANYL CITRATE (PF) 100 MCG/2ML IJ SOLN
INTRAMUSCULAR | Status: DC | PRN
  Administered 2019-11-07: 75 ug via INTRAVENOUS
  Administered 2019-11-07: 25 ug via INTRAVENOUS

## 2019-11-07 MED ORDER — ISOPROTERENOL HCL 0.2 MG/ML IJ SOLN
INTRAVENOUS | Status: DC | PRN
  Administered 2019-11-07: 10:00:00 10 ug/min via INTRAVENOUS

## 2019-11-07 MED ORDER — PROPOFOL 10 MG/ML IV BOLUS
INTRAVENOUS | Status: DC | PRN
  Administered 2019-11-07: 170 mg via INTRAVENOUS

## 2019-11-07 MED ORDER — PHENYLEPHRINE 40 MCG/ML (10ML) SYRINGE FOR IV PUSH (FOR BLOOD PRESSURE SUPPORT)
PREFILLED_SYRINGE | INTRAVENOUS | Status: DC | PRN
  Administered 2019-11-07 (×2): 120 ug via INTRAVENOUS
  Administered 2019-11-07 (×2): 80 ug via INTRAVENOUS

## 2019-11-07 MED ORDER — PROTAMINE SULFATE 10 MG/ML IV SOLN
INTRAVENOUS | Status: DC | PRN
  Administered 2019-11-07: 10 mg via INTRAVENOUS
  Administered 2019-11-07: 30 mg via INTRAVENOUS

## 2019-11-07 MED ORDER — SUCCINYLCHOLINE CHLORIDE 200 MG/10ML IV SOSY
PREFILLED_SYRINGE | INTRAVENOUS | Status: DC | PRN
  Administered 2019-11-07: 100 mg via INTRAVENOUS

## 2019-11-07 MED ORDER — HYDROCODONE-ACETAMINOPHEN 5-325 MG PO TABS: 1.0000 | ORAL_TABLET | ORAL | Status: DC | PRN

## 2019-11-07 MED ORDER — PHENYLEPHRINE HCL-NACL 10-0.9 MG/250ML-% IV SOLN
INTRAVENOUS | Status: DC | PRN
  Administered 2019-11-07: 25 ug/min via INTRAVENOUS

## 2019-11-07 MED ORDER — ROCURONIUM BROMIDE 100 MG/10ML IV SOLN
INTRAVENOUS | Status: DC | PRN
  Administered 2019-11-07: 50 mg via INTRAVENOUS

## 2019-11-07 MED ORDER — MIDAZOLAM HCL 5 MG/5ML IJ SOLN
INTRAMUSCULAR | Status: DC | PRN
  Administered 2019-11-07: 2 mg via INTRAVENOUS

## 2019-11-07 MED ORDER — ALBUTEROL SULFATE HFA 108 (90 BASE) MCG/ACT IN AERS
INHALATION_SPRAY | RESPIRATORY_TRACT | Status: DC | PRN
  Administered 2019-11-07: 6 via RESPIRATORY_TRACT

## 2019-11-07 MED ORDER — HEPARIN SODIUM (PORCINE) 1000 UNIT/ML IJ SOLN
INTRAMUSCULAR | Status: AC
  Filled 2019-11-07: qty 2

## 2019-11-07 MED ORDER — SODIUM CHLORIDE 0.9% FLUSH: 3.0000 mL | Freq: Two times a day (BID) | INTRAVENOUS | Status: DC

## 2019-11-07 MED ORDER — SODIUM CHLORIDE 0.9 % IV SOLN: 250.0000 mL | INTRAVENOUS | Status: DC | PRN

## 2019-11-07 SURGICAL SUPPLY — 19 items
BLANKET WARM UNDERBOD FULL ACC (MISCELLANEOUS) ×2 IMPLANT
CATH MAPPNG PENTARAY F 2-6-2MM (CATHETERS) ×1 IMPLANT
CATH SMTCH THERMOCOOL SF DF (CATHETERS) ×2 IMPLANT
CATH SOUNDSTAR ECO 8FR (CATHETERS) ×2 IMPLANT
CATH WEBSTER BI DIR CS D-F CRV (CATHETERS) ×2 IMPLANT
COVER SWIFTLINK CONNECTOR (BAG) ×2 IMPLANT
DEVICE CLOSURE PERCLS PRGLD 6F (VASCULAR PRODUCTS) ×3 IMPLANT
NEEDLE BAYLIS TRANSSEPTAL 71CM (NEEDLE) ×2 IMPLANT
PACK EP LATEX FREE (CUSTOM PROCEDURE TRAY) ×1
PACK EP LF (CUSTOM PROCEDURE TRAY) ×1 IMPLANT
PAD PRO RADIOLUCENT 2001M-C (PAD) ×2 IMPLANT
PATCH CARTO3 (PAD) ×2 IMPLANT
PENTARAY F 2-6-2MM (CATHETERS) ×2
PERCLOSE PROGLIDE 6F (VASCULAR PRODUCTS) ×6
SHEATH PINNACLE 7F 10CM (SHEATH) ×4 IMPLANT
SHEATH PINNACLE 9F 10CM (SHEATH) ×2 IMPLANT
SHEATH PROBE COVER 6X72 (BAG) ×2 IMPLANT
SHEATH SWARTZ TS SL2 63CM 8.5F (SHEATH) ×2 IMPLANT
TUBING SMART ABLATE COOLFLOW (TUBING) ×2 IMPLANT

## 2019-11-07 NOTE — Discharge Instructions (Signed)
Post procedure care instructions No driving for 4 days. No lifting over 5 lbs for 1 week. No vigorous or sexual activity for 1 week. You may return to work/your usual activities on 11/14/2019. Keep procedure site clean & dry. If you notice increased pain, swelling, bleeding or pus, call/return!  You may shower, but no soaking baths/hot tubs/pools for 1 week.      Cardiac Ablation, Care After  This sheet gives you information about how to care for yourself after your procedure. Your health care provider may also give you more specific instructions. If you have problems or questions, contact your health care provider. What can I expect after the procedure? After the procedure, it is common to have:  Bruising around your puncture site.  Tenderness around your puncture site.  Skipped heartbeats.  Tiredness (fatigue).  Follow these instructions at home: Puncture site care   Follow instructions from your health care provider about how to take care of your puncture site. Make sure you:  Check your puncture site every day for signs of infection. Check for: ? Redness, swelling, or pain. ? Fluid or blood. If your puncture site starts to bleed, lie down on your back, apply firm pressure to the area, and contact your health care provider. ? Warmth. ? Pus or a bad smell. Driving  Do not drive for at least 4 days after your procedure or however long your health care provider recommends. (Do not resume driving if you have previously been instructed not to drive for other health reasons.)  Do not drive or use heavy machinery while taking prescription pain medicine. Activity  Avoid activities that take a lot of effort for at least 7 days after your procedure.  Do not lift anything that is heavier than 5 lb (4.5 kg) for one week.   No sexual activity for 1 week.   Return to your normal activities as told by your health care provider. Ask your health care provider what activities are safe for  you. General instructions  Take over-the-counter and prescription medicines only as told by your health care provider.  Do not use any products that contain nicotine or tobacco, such as cigarettes and e-cigarettes. If you need help quitting, ask your health care provider.  You may shower after 24 hours, but Do not take baths, swim, or use a hot tub for 1 week.   Do not drink alcohol for 24 hours after your procedure.  Keep all follow-up visits as told by your health care provider. This is important. Contact a health care provider if:  You have redness, mild swelling, or pain around your puncture site.  You have fluid or blood coming from your puncture site that stops after applying firm pressure to the area.  Your puncture site feels warm to the touch.  You have pus or a bad smell coming from your puncture site.  You have a fever.  You have chest pain or discomfort that spreads to your neck, jaw, or arm.  You are sweating a lot.  You feel nauseous.  You have a fast or irregular heartbeat.  You have shortness of breath.  You are dizzy or light-headed and feel the need to lie down.  You have pain or numbness in the arm or leg closest to your puncture site. Get help right away if:  Your puncture site suddenly swells.  Your puncture site is bleeding and the bleeding does not stop after applying firm pressure to the area. These symptoms may  represent a serious problem that is an emergency. Do not wait to see if the symptoms will go away. Get medical help right away. Call your local emergency services (911 in the [place] Do not drive yourself to the hospital. Summary  After the procedure, it is normal to have bruising and tenderness at the puncture site in your groin, neck, or forearm.  Check your puncture site every day for signs of infection.  Get help right away if your puncture site is bleeding and the bleeding does not stop after applying firm pressure to the area. This  is a medical emergency. This information is not intended to replace advice given to you by your health care provider. Make sure you discuss any questions you have with your health care provider.

## 2019-11-07 NOTE — Transfer of Care (Signed)
Immediate Anesthesia Transfer of Care Note  Patient: Lawrence Rivas  Procedure(s) Performed: ATRIAL FIBRILLATION ABLATION (N/A )  Patient Location: Cath Lab  Anesthesia Type:General  Level of Consciousness: awake, alert  and oriented  Airway & Oxygen Therapy: Patient Spontanous Breathing and Patient connected to nasal cannula oxygen  Post-op Assessment: Report given to RN and Post -op Vital signs reviewed and stable  Post vital signs: Reviewed and stable  Last Vitals:  Vitals Value Taken Time  BP 139/59 11/07/19 1019  Temp 36.4 C 11/07/19 1020  Pulse 72 11/07/19 1020  Resp 10 11/07/19 1020  SpO2 97 % 11/07/19 1020  Vitals shown include unvalidated device data.  Last Pain:  Vitals:   11/07/19 1020  TempSrc: Temporal  PainSc: Asleep      Patients Stated Pain Goal: 3 ([id number])  Complications: No apparent anesthesia complications

## 2019-11-07 NOTE — Anesthesia Postprocedure Evaluation (Signed)
Anesthesia Post Note  Patient: Lawrence Rivas  Procedure(s) Performed: ATRIAL FIBRILLATION ABLATION (N/A )     Patient location during evaluation: Short Stay Anesthesia Type: General Level of consciousness: awake Pain management: pain level controlled Vital Signs Assessment: post-procedure vital signs reviewed and stable Respiratory status: spontaneous breathing Cardiovascular status: stable Postop Assessment: no apparent nausea or vomiting Anesthetic complications: no    Last Vitals:  Vitals:   11/07/19 1300 11/07/19 1310  BP: 134/67 138/65  Pulse: 68 69  Resp: (!) 31 14  Temp:    SpO2: 93% 93%    Last Pain:  Vitals:   11/07/19 1122  TempSrc:   PainSc: 4                  Kimra Kantor

## 2019-11-07 NOTE — Progress Notes (Signed)
C/O irritation and itching lt eye. Eye has some mild puffiness. PA notified.

## 2019-11-07 NOTE — Progress Notes (Signed)
Late entry: RN called regarding pt c/o L eye discomfort, burning.   Noted to have tearing, some conjunctival erythema, he reported burning sensation and light sensitivity.   No visual loss/change.  No obvious signs of trauma Denies h/o glaucoma Reports started post procedure Denies any symptoms or trauma prior to coming in, no rubbing his eyes, allergies.  Made anesthesia team in our current case aware, they will have someone see the patient  F/u with patient prior to him leaving He reports anesthesia did come by and examined his eye They placed a patch for comfort and given instructions to call if not better tomorrow. The patient reports if not improved to morrow he will reach out to his own eye MD.    Re-enforced post procedure/ablation activity restrictions and site care instructions, he had no follow up questions.  Tommye Standard, PA-C

## 2019-11-07 NOTE — Interval H&P Note (Signed)
History and Physical Interval Note:  11/07/2019 7:07 AM  Lawrence Rivas  has presented today for surgery, with the diagnosis of Atrial fibrillation.  The various methods of treatment have been discussed with the patient and family. After consideration of risks, benefits and other options for treatment, the patient has consented to  Procedure(s): ATRIAL FIBRILLATION ABLATION (N/A) as a surgical intervention.  The patient's history has been reviewed, patient examined, no change in status, stable for surgery.  I have reviewed the patient's chart and labs.  Questions were answered to the patient's satisfaction.     Cardiac CT reviewed with patient today.  He reports compliance with xarelto without interruption.   Thompson Grayer

## 2019-11-07 NOTE — Anesthesia Procedure Notes (Signed)
Procedure Name: Intubation Date/Time: 11/07/2019 7:50 AM Performed by: Candis Shine, CRNA Pre-anesthesia Checklist: Patient identified, Emergency Drugs available, Suction available and Patient being monitored Patient Re-evaluated:Patient Re-evaluated prior to induction Oxygen Delivery Method: Circle System Utilized Preoxygenation: Pre-oxygenation with 100% oxygen Induction Type: IV induction and Rapid sequence Laryngoscope Size: Mac and 4 Grade View: Grade II Tube type: Oral Tube size: 7.5 mm Number of attempts: 1 Airway Equipment and Method: Stylet Placement Confirmation: ETT inserted through vocal cords under direct vision,  positive ETCO2 and breath sounds checked- equal and bilateral Secured at: 22 cm Tube secured with: Tape Dental Injury: Teeth and Oropharynx as per pre-operative assessment

## 2019-11-14 ENCOUNTER — Ambulatory Visit (INDEPENDENT_AMBULATORY_CARE_PROVIDER_SITE_OTHER): Payer: Medicare Other | Admitting: *Deleted

## 2019-11-14 DIAGNOSIS — I480 Paroxysmal atrial fibrillation: Secondary | ICD-10-CM | POA: Diagnosis not present

## 2019-11-14 LAB — CUP PACEART REMOTE DEVICE CHECK
Date Time Interrogation Session: 20210503181419
Implantable Pulse Generator Implant Date: 20210301

## 2019-11-15 NOTE — Progress Notes (Signed)
Carelink Summary Report / Loop Recorder 

## 2019-11-22 DIAGNOSIS — Z0120 Encounter for dental examination and cleaning without abnormal findings: Secondary | ICD-10-CM | POA: Diagnosis not present

## 2019-11-26 NOTE — Progress Notes (Signed)
Primary Care Physician: Colon Branch, MD Primary Electrophysiologist: Dr Rayann Heman Referring Physician: [name] is a 69 y.o. male with a history of HTN, paroxysmal atrial fibrillation, atrial flutter, and prior TIA who presents for follow up in the [facility].  The patient was initially diagnosed with atrial flutter in 2011 and underwent a flutter ablation. He was diagnosed with atrial fibrillation later that year. He was maintained on flecainide but this was stopped 2/2 presyncope side effects. Patient is on Xarelto for a CHADS2VASC score of 4. His ILR showed an increase afib burden associated with elevated heart rates. He underwent afib ablation with Dr Rayann Heman on 11/07/19. Patient reports he has done well since his procedure. He did have one episode of chest fullness which was typical of his afib symptoms but this was brief. He denies CP, swallowing, or groin issues.  Today, he denies symptoms of palpitations, chest pain, shortness of breath, orthopnea, PND, lower extremity edema, dizziness, presyncope, syncope, snoring, daytime somnolence, bleeding, or neurologic sequela. The patient is tolerating medications without difficulties and is otherwise without complaint today.    Atrial Fibrillation Risk Factors:  he does not have symptoms or diagnosis of sleep apnea. he does not have a history of rheumatic fever.   he has a BMI of Body mass index is 29.05 kg/m.Marland Kitchen Filed Weights   11/27/19 0842  Weight: 97.2 kg    Family History  Problem Relation Age of Onset  . Coronary artery disease Father 41  . Stroke Mother        in her 12-90s  . Diabetes Maternal Grandmother   . Colon cancer Neg Hx   . Prostate cancer Neg Hx      Atrial Fibrillation Management history:  Previous antiarrhythmic drugs: flecainide Previous cardioversions: none Previous ablations: 09/2009 flutter, 11/07/19 CHADS2VASC score: 4 Anticoagulation history:  Xarelto   Past Medical History:  Diagnosis Date  . Erectile dysfunction   . Numbness and tingling    right arm   . Paroxysmal A-fib (HCC)    no recurrence s/p atrial flutter ablation (previously atrial flutter felt to degenerate into afib)  . Psoriasis   . TIA (transient ischemic attack) 2004   Past Surgical History:  Procedure Laterality Date  . A FLUTTER ABLATION     March 2011 at [facility] by Dr Lyanne Co Dixat  . ATRIAL FIBRILLATION ABLATION N/A 11/07/2019   Procedure: ATRIAL FIBRILLATION ABLATION;  Surgeon: Thompson Grayer, MD;  Location: [facility];  Service: Cardiovascular;  Laterality: N/A;  . implantable loop recorder insertion  09/11/2019   Medtronic Reveal Linq model LNQ 22 (SN [id number]) implantable loop recorder implanted by Dr Rayann Heman for afib management and evaluation of palpitations  . TONSILLECTOMY    . TOTAL HIP ARTHROPLASTY     right    Current Outpatient Medications  Medication Sig Dispense Refill  . acetaminophen (TYLENOL) 325 MG tablet Take 650 mg by mouth as needed for moderate pain.     Marland Kitchen diltiazem (CARDIZEM CD) 240 MG 24 hr capsule Take 1 capsule (240 mg total) by mouth daily. 90 capsule 3  . diltiazem (CARDIZEM) 30 MG tablet Take 1 tablet (30 mg total) by mouth 2 (two) times daily as needed (Palpitations). 60 tablet 11  . fluticasone (FLONASE) 50 MCG/ACT nasal spray Place 2 sprays into both nostrils daily as needed for allergies or rhinitis.    . pantoprazole (PROTONIX) 40 MG tablet TAKE 1 TABLET(40 MG)  BY MOUTH DAILY 90 tablet 3  . rivaroxaban (XARELTO) 20 MG TABS tablet Take 1 tablet (20 mg total) by mouth daily with supper. (Patient taking differently: Take 20 mg by mouth daily with lunch. ) 30 tablet 11   No current facility-administered medications for this encounter.    Allergies  Allergen Reactions  . Watermelon [Citrullus Vulgaris] Itching  . Coconut Oil Hives    Social History   Socioeconomic History  . Marital status:  Married    Spouse name: Not on file  . Number of children: 1  . Years of education: Not on file  . Highest education level: Not on file  Occupational History  . Occupation: Architect    Comment: [employer]     Employer: [employer]  Tobacco Use  . Smoking status: Current Every Day Smoker    Packs/day: 1.00    Types: Cigarettes  . Smokeless tobacco: Never Used  . Tobacco comment: he will try to quit  Substance and Sexual Activity  . Alcohol use: Yes    Comment: 2 drinks of scotch per day  . Drug use: No  . Sexual activity: Not on file  Other Topics Concern  . Not on file  Social History Narrative       Social Determinants of Health   Financial Resource Strain:   . Difficulty of Paying Living Expenses:   Food Insecurity:   . Worried About Charity fundraiser in the Last Year:   . Arboriculturist in the Last Year:   Transportation Needs:   . Film/video editor (Medical):   Marland Kitchen Lack of Transportation (Non-Medical):   Physical Activity:   . Days of Exercise per Week:   . Minutes of Exercise per Session:   Stress:   . Feeling of Stress :   Social Connections:   . Frequency of Communication with Friends and Family:   . Frequency of Social Gatherings with Friends and Family:   . Attends Religious Services:   . Active Member of Clubs or Organizations:   . Attends Archivist Meetings:   Marland Kitchen Marital Status:   Intimate Partner Violence:   . Fear of Current or Ex-Partner:   . Emotionally Abused:   Marland Kitchen Physically Abused:   . Sexually Abused:      ROS- All systems are reviewed and negative except as per the HPI above.  Physical Exam: Vitals:   11/27/19 0842  BP: (!) 146/76  Pulse: 69  Weight: 97.2 kg  Height: 6' (1.829 m)    GEN- The patient is well appearing, alert and oriented x 3 today.   Head- normocephalic, atraumatic Eyes-  Sclera clear, conjunctiva pink Ears- hearing intact Oropharynx- clear Neck- supple  Lungs- Clear to ausculation  bilaterally, normal work of breathing Heart- Regular rate and rhythm, no murmurs, rubs or gallops  GI- soft, NT, ND, + BS Extremities- no clubbing, cyanosis, or edema MS- no significant deformity or atrophy Skin- no rash or lesion Psych- euthymic mood, full affect Neuro- strength and sensation are intact  Wt Readings from Last 3 Encounters:  11/27/19 97.2 kg  11/07/19 95.3 kg  10/23/19 93.9 kg    EKG today demonstrates SR HR 69, RBBB, LAFB, PR 94, QRS 128, QTc 443  Echo 07/25/18 demonstrated  - Left ventricle: The cavity size was normal. Wall thickness was  increased in a pattern of mild LVH. Systolic function was normal.  The estimated ejection fraction was in the range of  50% to 55%.  Wall motion was normal; there were no regional wall motion  abnormalities.  - Right atrium: The atrium was mildly dilated.  - Pulmonary arteries: PA peak pressure: 31 mm Hg (S).  Epic records are reviewed at length today  CHA2DS2-VASc Score = 4  The patient's score is based upon: CHF History: 0 HTN History: 1 Age : 1 Diabetes History: 0 Stroke History: 2 Vascular Disease History: 0 Gender: 0      ASSESSMENT AND PLAN: 1. Paroxysmal Atrial Fibrillation/typical atrial flutter The patient's CHA2DS2-VASc score is 4, indicating a 4.8% annual risk of stroke.   S/p afib ablation 11/07/19. Patient appears to be maintaining SR. Continue diltiazem 240 mg daily Continue Xarelto 20 mg daily with no missed doses for 3 months post ablation.   2. Secondary Hypercoagulable State (ICD10:  D68.69) The patient is at significant risk for stroke/thromboembolism based upon his CHA2DS2-VASc Score of 4.  Continue Rivaroxaban (Xarelto).   3. HTN Stable, no changes today.   Follow up with Dr Rayann Heman as scheduled.    [facility] [address] [place], [place] [address] [phone] 11/27/2019 9:06 AM

## 2019-11-27 ENCOUNTER — Other Ambulatory Visit: Payer: Self-pay

## 2019-11-27 ENCOUNTER — Ambulatory Visit (HOSPITAL_COMMUNITY)
Admission: RE | Admit: 2019-11-27 | Discharge: 2019-11-27 | Disposition: A | Discharge status: Home / Self Care | Payer: Medicare Other | Source: Ambulatory Visit | Attending: Nurse Practitioner | Admitting: Nurse Practitioner

## 2019-11-27 VITALS — BP 146/76 | HR 69 | Ht 72.0 in | Wt 214.2 lb

## 2019-11-27 DIAGNOSIS — I480 Paroxysmal atrial fibrillation: Secondary | ICD-10-CM | POA: Diagnosis not present

## 2019-11-27 DIAGNOSIS — N529 Male erectile dysfunction, unspecified: Secondary | ICD-10-CM | POA: Insufficient documentation

## 2019-11-27 DIAGNOSIS — D6869 Other thrombophilia: Secondary | ICD-10-CM | POA: Diagnosis not present

## 2019-11-27 DIAGNOSIS — I10 Essential (primary) hypertension: Secondary | ICD-10-CM | POA: Insufficient documentation

## 2019-11-27 DIAGNOSIS — I483 Typical atrial flutter: Secondary | ICD-10-CM | POA: Diagnosis not present

## 2019-11-27 DIAGNOSIS — I4510 Unspecified right bundle-branch block: Secondary | ICD-10-CM | POA: Diagnosis not present

## 2019-11-27 DIAGNOSIS — Z8673 Personal history of transient ischemic attack (TIA), and cerebral infarction without residual deficits: Secondary | ICD-10-CM | POA: Insufficient documentation

## 2019-11-27 DIAGNOSIS — L409 Psoriasis, unspecified: Secondary | ICD-10-CM | POA: Diagnosis not present

## 2019-11-27 DIAGNOSIS — F17210 Nicotine dependence, cigarettes, uncomplicated: Secondary | ICD-10-CM | POA: Insufficient documentation

## 2019-11-27 DIAGNOSIS — Z8249 Family history of ischemic heart disease and other diseases of the circulatory system: Secondary | ICD-10-CM | POA: Diagnosis not present

## 2019-11-27 DIAGNOSIS — Z823 Family history of stroke: Secondary | ICD-10-CM | POA: Diagnosis not present

## 2019-11-27 DIAGNOSIS — Z7901 Long term (current) use of anticoagulants: Secondary | ICD-10-CM | POA: Diagnosis not present

## 2019-12-05 ENCOUNTER — Ambulatory Visit (HOSPITAL_COMMUNITY): Payer: Medicare Other | Admitting: Nurse Practitioner

## 2019-12-06 ENCOUNTER — Telehealth: Payer: Medicare Other | Admitting: Internal Medicine

## 2019-12-15 LAB — CUP PACEART REMOTE DEVICE CHECK
Date Time Interrogation Session: 20210603181501
Implantable Pulse Generator Implant Date: 20210301

## 2019-12-18 ENCOUNTER — Ambulatory Visit (INDEPENDENT_AMBULATORY_CARE_PROVIDER_SITE_OTHER): Payer: Medicare Other | Admitting: *Deleted

## 2019-12-18 DIAGNOSIS — I480 Paroxysmal atrial fibrillation: Secondary | ICD-10-CM

## 2019-12-20 ENCOUNTER — Telehealth: Payer: Self-pay | Admitting: Internal Medicine

## 2019-12-20 NOTE — Telephone Encounter (Signed)
Returned call to wife.  Advised Pt cannot take aspirin for pain relief while on Xarelto.  Advised Pt cannot stop Xarelto.  Advised Pt could take more tylenol, up to 4000 mg daily.  Wife states she will let husband know, but he really wants to take aspirin.

## 2019-12-20 NOTE — Progress Notes (Signed)
Carelink Summary Report / Loop Recorder 

## 2019-12-20 NOTE — Telephone Encounter (Signed)
New Message  Pt has a question about the medication rivaroxaban (XARELTO) 20 MG TABS tablet. Wants nurse to call to discuss.

## 2020-01-02 ENCOUNTER — Encounter: Payer: Self-pay | Admitting: Gastroenterology

## 2020-01-02 ENCOUNTER — Ambulatory Visit (INDEPENDENT_AMBULATORY_CARE_PROVIDER_SITE_OTHER): Payer: Medicare Other | Admitting: Internal Medicine

## 2020-01-02 ENCOUNTER — Other Ambulatory Visit: Payer: Self-pay

## 2020-01-02 ENCOUNTER — Encounter: Payer: Self-pay | Admitting: Internal Medicine

## 2020-01-02 VITALS — BP 128/75 | HR 76 | Temp 97.0°F | Resp 18 | Ht 72.0 in | Wt 213.0 lb

## 2020-01-02 DIAGNOSIS — R197 Diarrhea, unspecified: Secondary | ICD-10-CM | POA: Diagnosis not present

## 2020-01-02 DIAGNOSIS — K529 Noninfective gastroenteritis and colitis, unspecified: Secondary | ICD-10-CM

## 2020-01-02 LAB — CBC WITH DIFFERENTIAL/PLATELET
Basophils Absolute: 0.1 10*3/uL (ref 0.0–0.1)
Basophils Relative: 1 % (ref 0.0–3.0)
Eosinophils Absolute: 0.2 10*3/uL (ref 0.0–0.7)
Eosinophils Relative: 2.9 % (ref 0.0–5.0)
HCT: 41.5 % (ref 39.0–52.0)
Hemoglobin: 14.1 g/dL (ref 13.0–17.0)
Lymphocytes Relative: 36.6 % (ref 12.0–46.0)
Lymphs Abs: 1.9 10*3/uL (ref 0.7–4.0)
MCHC: 34 g/dL (ref 30.0–36.0)
MCV: 101.5 fl — ABNORMAL HIGH (ref 78.0–100.0)
Monocytes Absolute: 0.6 10*3/uL (ref 0.1–1.0)
Monocytes Relative: 11 % (ref 3.0–12.0)
Neutro Abs: 2.5 10*3/uL (ref 1.4–7.7)
Neutrophils Relative %: 48.5 % (ref 43.0–77.0)
Platelets: 195 10*3/uL (ref 150.0–400.0)
RBC: 4.09 Mil/uL — ABNORMAL LOW (ref 4.22–5.81)
RDW: 13.2 % (ref 11.5–15.5)
WBC: 5.2 10*3/uL (ref 4.0–10.5)

## 2020-01-02 LAB — HEPATIC FUNCTION PANEL
ALT: 22 U/L (ref 0–53)
AST: 21 U/L (ref 0–37)
Albumin: 4 g/dL (ref 3.5–5.2)
Alkaline Phosphatase: 56 U/L (ref 39–117)
Bilirubin, Direct: 0.1 mg/dL (ref 0.0–0.3)
Total Bilirubin: 0.5 mg/dL (ref 0.2–1.2)
Total Protein: 6.7 g/dL (ref 6.0–8.3)

## 2020-01-02 LAB — TSH: TSH: 1.38 u[IU]/mL (ref 0.35–4.50)

## 2020-01-02 NOTE — Progress Notes (Signed)
Subjective:    Patient ID: Lawrence Rivas, male    DOB: 17-Dec-1950, 69 y.o.   MRN: [id number]  DOS:  01/02/2020 Type of visit - description: New patient, last visit 2015.  CC: Diarrhea  His main concern today is diarrhea however chart is reviewed:  He was doing pretty good until earlier this year when he started having  cardiac symptoms, eventually had a loop implant, was told he had asymptomatic A. fib, started Xarelto, diltiazem dose increased and had an ablation 10-2019.  About the time he started Xarelto 09-2019 he noted diarrhea: Typically has 1 normal bowel movement every morning and then after lunch  develops episodes of cramps follow-up by loose stools that eventually become watery. Sometimes have 4-5 BMs daily.    Review of Systems Has fever chills Modest weight loss, possibly 3 pounds in the last few months. Appetite is very good No nausea, vomiting or blood in the stools. He is a smoker, has a morning cough but otherwise no respiratory symptoms.  Past Medical History:  Diagnosis Date  . Erectile dysfunction   . Numbness and tingling    right arm   . Paroxysmal A-fib (HCC)    no recurrence s/p atrial flutter ablation (previously atrial flutter felt to degenerate into afib)  . Psoriasis   . TIA (transient ischemic attack) 2004    Past Surgical History:  Procedure Laterality Date  . A FLUTTER ABLATION     March 2011 at [facility] by Dr Lyanne Co Dixat  . ATRIAL FIBRILLATION ABLATION N/A 11/07/2019   Procedure: ATRIAL FIBRILLATION ABLATION;  Surgeon: Thompson Grayer, MD;  Location: [facility];  Service: Cardiovascular;  Laterality: N/A;  . implantable loop recorder insertion  09/11/2019   Medtronic Reveal Linq model LNQ 22 (SN [id number]) implantable loop recorder implanted by Dr Rayann Heman for afib management and evaluation of palpitations  . TONSILLECTOMY    . TOTAL HIP ARTHROPLASTY     right    Allergies as of 01/02/2020      Reactions   Watermelon  [citrullus Vulgaris] Itching   Coconut Oil Hives      Medication List       Accurate as of January 02, 2020 11:59 PM. If you have any questions, ask your nurse or doctor.        STOP taking these medications   pantoprazole 40 MG tablet Commonly known as: PROTONIX Stopped by: Kathlene November, MD     TAKE these medications   acetaminophen 325 MG tablet Commonly known as: TYLENOL Take 650 mg by mouth as needed for moderate pain.   diltiazem 240 MG 24 hr capsule Commonly known as: CARDIZEM CD Take 1 capsule (240 mg total) by mouth daily.   diltiazem 30 MG tablet Commonly known as: CARDIZEM Take 1 tablet (30 mg total) by mouth 2 (two) times daily as needed (Palpitations).   fluticasone 50 MCG/ACT nasal spray Commonly known as: FLONASE Place 2 sprays into both nostrils daily as needed for allergies or rhinitis.   rivaroxaban 20 MG Tabs tablet Commonly known as: Xarelto Take 1 tablet (20 mg total) by mouth daily with supper. What changed: when to take this          Objective:   Physical Exam BP 128/75 (BP Location: Left Arm, Patient Position: Sitting, Cuff Size: Normal)   Pulse 76   Temp (!) 97 F (36.1 C) (Temporal)   Resp 18   Ht 6' (1.829 m)   Wt 213 lb (96.6  kg)   SpO2 97%   BMI 28.89 kg/m  General:   Well developed, NAD, BMI noted.  HEENT:  Normocephalic . Face symmetric, atraumatic.  Not pale Lungs:  CTA B Normal respiratory effort, no intercostal retractions, no accessory muscle use. Heart: RRR,  no murmur.  Abdomen:  Not distended, soft, non-tender. No rebound or rigidity.  No bruit at Skin: Not pale. Not jaundice Lower extremities: no pretibial edema bilaterally  Neurologic:  alert & oriented X3.  Speech normal, gait appropriate for age and unassisted Psych--  Cognition and judgment appear intact.  Cooperative with normal attention span and concentration.  Behavior appropriate. No anxious or depressed appearing.     Assessment    Assessment P-  Atrial fibrillation: Ablation remotely, symptoms resurfaced 2021, loop implantation, started Xarelto 09-2019 had an ablation April 2021. Tobacco abuse Erectal dysfunction TIA 2004    PLAN Diarrhea, chronic: Started approximately 09-2019, as described above, no abdominal pain or blood in the stools.  All in the context of restarting Xarelto which is unlikely to cause diarrhea. No recent antibiotic intake. Lab reviewed, will get full panel LFTs, CBC, TSH, stools for C diff and  I fob. Refer to GI for consideration of a colonoscopy RTC 2 months CPX  This visit occurred during the SARS-CoV-2 public health emergency.  Safety protocols were in place, including screening questions prior to the visit, additional usage of staff PPE, and extensive cleaning of exam room while observing appropriate contact time as indicated for disinfecting solutions.

## 2020-01-02 NOTE — Patient Instructions (Addendum)
Please schedule Medicare Wellness with Glenard Haring.    GO TO THE LAB : Get the blood work     GO TO THE FRONT DESK, [place] Come back for   an exam in 6 to 8 weeks  Bring back: The stool card called  IFOB Container with liquidy stools

## 2020-01-02 NOTE — Progress Notes (Signed)
Pre visit review using our clinic review tool, if applicable. No additional management support is needed unless otherwise documented below in the visit note. 

## 2020-01-03 ENCOUNTER — Encounter: Payer: Self-pay | Admitting: Internal Medicine

## 2020-01-04 ENCOUNTER — Other Ambulatory Visit (INDEPENDENT_AMBULATORY_CARE_PROVIDER_SITE_OTHER): Payer: Medicare Other

## 2020-01-04 ENCOUNTER — Telehealth: Payer: Self-pay | Admitting: *Deleted

## 2020-01-04 DIAGNOSIS — R197 Diarrhea, unspecified: Secondary | ICD-10-CM | POA: Diagnosis not present

## 2020-01-04 LAB — FECAL OCCULT BLOOD, IMMUNOCHEMICAL: Fecal Occult Bld: POSITIVE — AB

## 2020-01-04 LAB — CLOSTRIDIUM DIFFICILE BY PCR: Toxigenic C. Difficile by PCR: NEGATIVE

## 2020-01-04 NOTE — Telephone Encounter (Signed)
Received call from Margarita Grizzle at [facility] reporting positive IFOB result.

## 2020-01-05 NOTE — Telephone Encounter (Signed)
Let patient know about results, this underscore the need to see GI.

## 2020-01-05 NOTE — Telephone Encounter (Signed)
LMOM informing of results- informed that I would try to get him scheduled w/ GI sooner.

## 2020-01-18 ENCOUNTER — Telehealth: Payer: Self-pay

## 2020-01-18 ENCOUNTER — Encounter: Payer: Self-pay | Admitting: Gastroenterology

## 2020-01-18 ENCOUNTER — Ambulatory Visit (INDEPENDENT_AMBULATORY_CARE_PROVIDER_SITE_OTHER): Payer: Medicare Other | Admitting: Gastroenterology

## 2020-01-18 VITALS — BP 138/62 | HR 74 | Ht 72.0 in | Wt 213.0 lb

## 2020-01-18 DIAGNOSIS — K529 Noninfective gastroenteritis and colitis, unspecified: Secondary | ICD-10-CM | POA: Diagnosis not present

## 2020-01-18 DIAGNOSIS — R1032 Left lower quadrant pain: Secondary | ICD-10-CM

## 2020-01-18 DIAGNOSIS — R195 Other fecal abnormalities: Secondary | ICD-10-CM | POA: Diagnosis not present

## 2020-01-18 NOTE — Patient Instructions (Signed)
If you are age 70 or older, your body mass index should be between 23-30. Your Body mass index is 28.89 kg/m. If this is out of the aforementioned range listed, please consider follow up with your Primary Care Provider.  If you are age 92 or younger, your body mass index should be between 19-25. Your Body mass index is 28.89 kg/m. If this is out of the aformentioned range listed, please consider follow up with your Primary Care Provider.   You have been scheduled for a colonoscopy. Please follow written instructions given to you at your visit today.  Please pick up your prep supplies at the pharmacy within the next 1-3 days. If you use inhalers (even only as needed), please bring them with you on the day of your procedure.  You will be contacted by our office prior to your procedure for directions on holding your Xarelto.  If you do not hear from our office 1 week prior to your scheduled procedure, please call [phone] to discuss.   It was a pleasure to see you today!  Dr. Loletha Carrow

## 2020-01-18 NOTE — Telephone Encounter (Signed)
Clinical pharmacist to review Xarelto 

## 2020-01-18 NOTE — Progress Notes (Signed)
[facility] Consult Note:  History: Lawrence Rivas 01/18/2020  Referring provider: Colon Branch, MD  Reason for consult/chief complaint: Diarrhea (Since Jan or Feb, loose watery stools with no visible blood. Pt was Occult blood positive with PCP. Pt has 3-4 loose stools a day.), Abdominal Cramping (LLQ abd cramping  with diarrhea, 1 hour after eating), and Gas (increased gas)   Subjective  HPI: From 01/02/20 PCP visit: "Type of visit - description: New patient, last visit 2015.  CC: Diarrhea   His main concern today is diarrhea however chart is reviewed:   He was doing pretty good until earlier this year when he started having  cardiac symptoms, eventually had a loop implant, was told he had asymptomatic A. fib, started Xarelto, diltiazem dose increased and had an ablation 10-2019.   About the time he started Xarelto 09-2019 he noted diarrhea: Typically has 1 normal bowel movement every morning and then after lunch  develops episodes of cramps follow-up by loose stools that eventually become watery. Sometimes have 4-5 BMs daily." ____  Last colonoscopy approximately 2006 in [place] - no report in chart -he believes that may have been polyps, but pathology unknown.  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  He recalls it beginning soon after starting diltiazem and Xarelto for A. fib.  There was no other preceding illness or sick contact or antibiotic use.  His typical pattern is a bowel movement in the mid morning of probably normal character, then by around midday to early afternoon stool become more soft and then loose proceeded by urgency and left lower quadrant crampy pain.  No overt bleeding, stool was recently heme positive which concerns him.  He has bloating and increased gas as well.  Denies  nausea vomiting, dysphagia, odynophagia or early satiety.  Has gained some weight over the last year and for the last several months walking 4 miles twice a day to get in better shape.   ROS:  Review of Systems  Constitutional: Negative for appetite change and unexpected weight change.  HENT: Negative for mouth sores and voice change.   Eyes: Negative for pain and redness.  Respiratory: Negative for cough and shortness of breath.   Cardiovascular: Negative for chest pain and palpitations.  Genitourinary: Negative for dysuria and hematuria.  Musculoskeletal: Positive for arthralgias. Negative for myalgias.  Skin: Negative for pallor and rash.  Neurological: Negative for weakness and headaches.  Hematological: Negative for adenopathy.     Past Medical History: Past Medical History:  Diagnosis Date  . Erectile dysfunction   . Numbness and tingling    right arm   . Paroxysmal A-fib (HCC)    no recurrence s/p atrial flutter ablation (previously atrial flutter felt to degenerate into afib)  . Psoriasis   . TIA (transient ischemic attack) 2004     Past Surgical History: Past Surgical History:  Procedure Laterality Date  . A FLUTTER ABLATION     March 2011 at [facility] by Dr Lyanne Co Dixat  . ATRIAL FIBRILLATION ABLATION N/A 11/07/2019   Procedure: ATRIAL FIBRILLATION ABLATION;  Surgeon: Thompson Grayer, MD;  Location: [facility];  Service: Cardiovascular;  Laterality: N/A;  . implantable loop recorder insertion  09/11/2019   Medtronic Reveal Linq model LNQ 22 (SN [id number]) implantable loop recorder implanted by Dr Rayann Heman for afib management  and evaluation of palpitations  . TONSILLECTOMY    . TOTAL HIP ARTHROPLASTY     right     Family History: Family History  Problem Relation Age of Onset  . Coronary artery disease Father 40  . Stroke Mother        in her 40-90s  . Diabetes Maternal Grandmother   . Colon cancer Neg Hx   . Prostate cancer Neg Hx      Social History: Social History   Socioeconomic History  . Marital status: Married    Spouse name: Not on file  . Number of children: 1  . Years of education: Not on file  . Highest education level: Not on file  Occupational History  . Occupation: Architect    Comment: [name]   . Occupation: retired     Fish farm manager: [employer]  Tobacco Use  . Smoking status: Current Every Day Smoker    Packs/day: 1.00    Types: Cigarettes  . Smokeless tobacco: Never Used  . Tobacco comment: he will try to quit, ~ 1 ppd   Substance and Sexual Activity  . Alcohol use: Yes    Comment: 2 drinks of scotch per day  . Drug use: No  . Sexual activity: Not on file  Other Topics Concern  . Not on file  Social History Narrative   Household: pt and wife     Social Determinants of Radio broadcast assistant Strain:   . Difficulty of Paying Living Expenses:   Food Insecurity:   . Worried About Charity fundraiser in the Last Year:   . Arboriculturist in the Last Year:   Transportation Needs:   . Film/video editor (Medical):   Marland Kitchen Lack of Transportation (Non-Medical):   Physical Activity:   . Days of Exercise per Week:   . Minutes of Exercise per Session:   Stress:   . Feeling of Stress :   Social Connections:   . Frequency of Communication with Friends and Family:   . Frequency of Social Gatherings with Friends and Family:   . Attends Religious Services:   . Active Member of Clubs or Organizations:   . Attends Archivist Meetings:   Marland Kitchen Marital Status:     Allergies: Allergies  Allergen Reactions  . Watermelon [Citrullus Vulgaris] Itching  . Coconut Oil Hives    Outpatient Meds: Current Outpatient Medications  Medication Sig Dispense Refill  . acetaminophen (TYLENOL) 325 MG tablet Take 650 mg by mouth as needed for moderate pain.     Marland Kitchen diltiazem (CARDIZEM CD) 240 MG 24 hr capsule Take 1 capsule (240 mg total) by mouth daily. 90 capsule 3  . diltiazem (CARDIZEM)  30 MG tablet Take 1 tablet (30 mg total) by mouth 2 (two) times daily as needed (Palpitations). 60 tablet 11  . fluticasone (FLONASE) 50 MCG/ACT nasal spray Place 2 sprays into both nostrils daily as needed for allergies or rhinitis.    . rivaroxaban (XARELTO) 20 MG TABS tablet Take 1 tablet (20 mg total) by mouth daily with supper. (Patient taking differently: Take 20 mg by mouth daily with lunch. ) 30 tablet 11   No current facility-administered medications for this visit.      ___________________________________________________________________ Objective   Exam:  BP 138/62   Pulse 74   Ht 6' (1.829 m)   Wt 213 lb (96.6 kg)   BMI 28.89 kg/m    General: Well-appearing  Eyes: sclera anicteric,  no redness  ENT: oral mucosa moist without lesions, no cervical or supraclavicular lymphadenopathy  CV: RRR without murmur, S1/S2, no JVD, no peripheral edema  Resp: clear to auscultation bilaterally, normal RR and effort noted  GI: soft, no tenderness, with active bowel sounds. No guarding or palpable organomegaly noted.  Skin; warm and dry, no rash or jaundice noted  Neuro: awake, alert and oriented x 3. Normal gross motor function and fluent speech  Labs:  CBC Latest Ref Rng & Units 01/02/2020 10/31/2019 06/05/2013  WBC 4.0 - 10.5 K/uL 5.2 6.2 5.8  Hemoglobin 13.0 - 17.0 g/dL 14.1 13.9 14.8  Hematocrit 39 - 52 % 41.5 40.8 43.8  Platelets 150 - 400 K/uL 195.0 206 204.0   CMP Latest Ref Rng & Units 01/02/2020 10/31/2019 06/05/2013  Glucose 65 - 99 mg/dL - 102(H) 92  BUN 8 - 27 mg/dL - 21 14  Creatinine 0.76 - 1.27 mg/dL - 0.89 0.7  Sodium 134 - 144 mmol/L - 134 135  Potassium 3.5 - 5.2 mmol/L - 4.6 4.6  Chloride 96 - 106 mmol/L - 103 100  CO2 20 - 29 mmol/L - 29 29  Calcium 8.6 - 10.2 mg/dL - 9.3 9.0  Total Protein 6.0 - 8.3 g/dL 6.7 - 6.7  Total Bilirubin 0.2 - 1.2 mg/dL 0.5 - 0.6  Alkaline Phos 39 - 117 U/L 56 - 55  AST 0 - 37 U/L 21 - 25  ALT 0 - 53 U/L 22 - 27   Lab  Results  Component Value Date   TSH 1.38 01/02/2020   Recent negative C difficile PCR  Radiologic Studies:  No recent abdominal imaging  Assessment: Encounter Diagnoses  Name Primary?  . Chronic diarrhea Yes  . LLQ abdominal pain   . Heme positive stool    Possible new onset colitis.  Significance of heme positive stool in relation to diarrhea unclear. Recommended colonoscopy, and he was agreeable after discussion of procedure and risks.  The benefits and risks of the planned procedure were described in detail with the patient or (when appropriate) their health care proxy.  Risks were outlined as including, but not limited to, bleeding, infection, perforation, adverse medication reaction leading to cardiac or pulmonary decompensation, pancreatitis (if ERCP).  The limitation of incomplete mucosal visualization was also discussed.  No guarantees or warranties were given.  We discussed the small but real risk of stroke while off his Xarelto 2 days prior to procedure, perhaps up to 1 to 2 days afterwards if polyps removed.  We will consult with his cardiologist about this, and I expect they will be agreeable.  Arnette Norris is fairly sure that he has not had any more A. fib events since the ablation since he wears a loop recorder.   Thank you for the courtesy of this consult.  Please call me with any questions or concerns.  Nelida Meuse III  CC: Referring provider noted above

## 2020-01-18 NOTE — Telephone Encounter (Signed)
[facility] Pre-operative Risk Assessment     Request for surgical clearance:     Endoscopy Procedure  What type of surgery is being performed?     Colonoscopy   When is this surgery scheduled?     02-12-2020  What type of clearance is required ?   Pharmacy  Are there any medications that need to be held prior to surgery and how long? 2 days   Practice name and name of physician performing surgery?      [facility]  What is your office phone and fax number?      Phone- [phone]  [phone]  Anesthesia type (None, local, MAC, general) ?       MAC

## 2020-01-19 NOTE — Telephone Encounter (Signed)
Please inform the patient our clinical pharmacist's recommendation. I tried to reach the patient, but he did not pick up the phone

## 2020-01-19 NOTE — Telephone Encounter (Signed)
Patient with diagnosis of afib on Xarelto for anticoagulation.    Procedure: Colonoscopy    Date of procedure: 02/12/2020  CHADS2-VASc score of  4 (HTN, AGE, stroke/tia x 2)  CrCl 85 ml/min  Due to patient history of TIA, I would recommend holding anticoagulation for only 1 day.   If a 2 day hold is still desired, then Dr.Allred's input will be needed

## 2020-01-22 ENCOUNTER — Ambulatory Visit (INDEPENDENT_AMBULATORY_CARE_PROVIDER_SITE_OTHER): Payer: Medicare Other | Admitting: *Deleted

## 2020-01-22 ENCOUNTER — Telehealth: Payer: Self-pay | Admitting: Gastroenterology

## 2020-01-22 DIAGNOSIS — I480 Paroxysmal atrial fibrillation: Secondary | ICD-10-CM | POA: Diagnosis not present

## 2020-01-22 NOTE — Telephone Encounter (Signed)
Patient called to inform me he was notified by cardiology to hold his Xarelto for 1 day prior to the procedure not the 2 days as requested. Please advise

## 2020-01-22 NOTE — Telephone Encounter (Signed)
LM2CB on cell phone Called pt's home number, pt is not available. Spoke with pt's SPOUSE Leta Speller, notified of Xarelto ok to hold 1 day prior to procedure. Verbalized understanding.

## 2020-01-23 LAB — CUP PACEART REMOTE DEVICE CHECK
Date Time Interrogation Session: 20210711230355
Implantable Pulse Generator Implant Date: 20210301

## 2020-01-23 NOTE — Progress Notes (Signed)
Carelink Summary Report / Loop Recorder 

## 2020-01-29 NOTE — Telephone Encounter (Signed)
Patient has been notified and aware. He states clear understanding. He takes his Xarelto and understands to hold the day before.

## 2020-01-29 NOTE — Telephone Encounter (Signed)
He has normal renal function, so holding medicine one day prior to procedure is fine as long as that dose is in early AM on the day prior to procedure.  If he typically takes it in the evening, then take last dose two evenings prior to procedure.

## 2020-02-01 ENCOUNTER — Encounter: Payer: Self-pay | Admitting: Gastroenterology

## 2020-02-11 ENCOUNTER — Encounter: Payer: Self-pay | Admitting: Certified Registered Nurse Anesthetist

## 2020-02-12 ENCOUNTER — Other Ambulatory Visit: Payer: Self-pay

## 2020-02-12 ENCOUNTER — Encounter: Payer: Self-pay | Admitting: Gastroenterology

## 2020-02-12 ENCOUNTER — Ambulatory Visit: Payer: Medicare Other | Admitting: Internal Medicine

## 2020-02-12 ENCOUNTER — Ambulatory Visit (AMBULATORY_SURGERY_CENTER): Payer: Medicare Other | Admitting: Gastroenterology

## 2020-02-12 VITALS — BP 123/41 | HR 64 | Temp 96.8°F | Resp 14 | Ht 72.0 in | Wt 213.0 lb

## 2020-02-12 DIAGNOSIS — D127 Benign neoplasm of rectosigmoid junction: Secondary | ICD-10-CM

## 2020-02-12 DIAGNOSIS — R197 Diarrhea, unspecified: Secondary | ICD-10-CM | POA: Diagnosis not present

## 2020-02-12 DIAGNOSIS — R195 Other fecal abnormalities: Secondary | ICD-10-CM | POA: Diagnosis not present

## 2020-02-12 DIAGNOSIS — K529 Noninfective gastroenteritis and colitis, unspecified: Secondary | ICD-10-CM

## 2020-02-12 DIAGNOSIS — D122 Benign neoplasm of ascending colon: Secondary | ICD-10-CM

## 2020-02-12 DIAGNOSIS — D120 Benign neoplasm of cecum: Secondary | ICD-10-CM

## 2020-02-12 DIAGNOSIS — D124 Benign neoplasm of descending colon: Secondary | ICD-10-CM | POA: Diagnosis not present

## 2020-02-12 DIAGNOSIS — K5730 Diverticulosis of large intestine without perforation or abscess without bleeding: Secondary | ICD-10-CM | POA: Diagnosis not present

## 2020-02-12 DIAGNOSIS — I4891 Unspecified atrial fibrillation: Secondary | ICD-10-CM | POA: Diagnosis not present

## 2020-02-12 DIAGNOSIS — D123 Benign neoplasm of transverse colon: Secondary | ICD-10-CM | POA: Diagnosis not present

## 2020-02-12 DIAGNOSIS — D128 Benign neoplasm of rectum: Secondary | ICD-10-CM

## 2020-02-12 MED ORDER — SODIUM CHLORIDE 0.9 % IV SOLN: 500.0000 mL | Freq: Once | INTRAVENOUS | Status: DC

## 2020-02-12 NOTE — Progress Notes (Signed)
Report given to PACU, vss 

## 2020-02-12 NOTE — Op Note (Signed)
[facility] Patient Name: Lawrence Rivas Procedure Date: 02/12/2020 7:49 AM MRN: [id number] Endoscopist: Mallie Mussel L. Loletha Carrow , MD Age: 69 Referring MD:  Date of Birth: 01-Mar-1951 Gender: Male Account #: [id number] Procedure:                Colonoscopy Indications:              Chronic diarrhea, Heme positive stool Medicines:                Monitored Anesthesia Care Procedure:                Pre-Anesthesia Assessment:                           - Prior to the procedure, a History and Physical                            was performed, and patient medications and                            allergies were reviewed. The patient's tolerance of                            previous anesthesia was also reviewed. The risks                            and benefits of the procedure and the sedation                            options and risks were discussed with the patient.                            All questions were answered, and informed consent                            was obtained. Prior Anticoagulants: The patient has                            taken no previous anticoagulant or antiplatelet                            agents. ASA Grade Assessment: III - A patient with                            severe systemic disease. After reviewing the risks                            and benefits, the patient was deemed in                            satisfactory condition to undergo the procedure.                           After obtaining informed consent, the colonoscope  was passed under direct vision. Throughout the                            procedure, the patient's blood pressure, pulse, and                            oxygen saturations were monitored continuously. The                            Colonoscope was introduced through the anus and                            advanced to the the cecum, identified by                            appendiceal orifice and  ileocecal valve. The                            colonoscopy was somewhat difficult due to a                            redundant colon. Successful completion of the                            procedure was aided by using manual pressure. The                            patient tolerated the procedure well. The quality                            of the bowel preparation was good. The ileocecal                            valve, appendiceal orifice, and rectum were                            photographed. The bowel preparation used was                            Miralax. Scope In: 8:05:39 AM Scope Out: 8:54:31 AM Scope Withdrawal Time: 0 hours 41 minutes 55 seconds  Total Procedure Duration: 0 hours 48 minutes 52 seconds  Findings:                 The perianal and digital rectal examinations were                            normal.                           Four sessile polyps were found in the proximal                            transverse colon, ascending colon and cecum. The  polyps were 3 to 12 mm in size. These polyps were                            removed with a cold snare. Resection and retrieval                            were complete.                           Normal mucosa was found in the entire colon.                            Biopsies for histology were taken with a cold                            forceps from the right colon and left colon for                            evaluation of microscopic colitis.                           Six sessile polyps were found in the hepatic                            flexure and ascending colon. The polyps were 4 to 8                            mm in size. These polyps were removed with a cold                            snare. Resection and retrieval were complete.                           A tattoo was seen in the transverse colon. The                            tattoo site appeared normal.                            Four sessile polyps were found in the sigmoid                            colon, descending colon, mid transverse colon and                            distal transverse colon. The polyps were 4 to 6 mm                            in size. These polyps were removed with a cold                            snare. Resection and retrieval were complete. (one  of the transverse polyps was at the tattoo site.                           A few small-mouthed diverticula were found in the                            left colon.                           The exam was otherwise without abnormality on                            direct and retroflexion views. Complications:            No immediate complications. Estimated Blood Loss:     Estimated blood loss was minimal. Impression:               - Four 3 to 12 mm polyps in the proximal transverse                            colon, in the ascending colon and in the cecum,                            removed with a cold snare. Resected and retrieved.                           - Normal mucosa in the entire examined colon.                            Biopsied.                           - Six 4 to 8 mm polyps at the hepatic flexure and                            in the ascending colon, removed with a cold snare.                            Resected and retrieved.                           - A tattoo was seen in the transverse colon. The                            tattoo site appeared normal.                           - Four 4 to 6 mm polyps in the sigmoid colon, in                            the descending colon, in the mid transverse colon                            and in the distal transverse colon, removed  with a                            cold snare. Resected and retrieved.                           - Diverticulosis in the left colon.                           - The examination was otherwise normal on direct                             and retroflexion views. Recommendation:           - Patient has a contact number available for                            emergencies. The signs and symptoms of potential                            delayed complications were discussed with the                            patient. Return to normal activities tomorrow.                            Written discharge instructions were provided to the                            patient.                           - Resume previous diet.                           - Continue present medications.                           - Await pathology results.                           - Repeat colonoscopy in 1 year for surveillance.                           - Resume Xarelto (rivaroxaban) at prior dose in 3                            days. Nevin Kozuch L. Loletha Carrow, MD 02/12/2020 9:04:54 AM This report has been signed electronically.

## 2020-02-12 NOTE — Progress Notes (Signed)
Vitals-WR  Pt's states no medical or surgical changes since previsit or office visit. 

## 2020-02-12 NOTE — Progress Notes (Signed)
0835 0.5 mg Glucagon IV requested by Dr Loletha Carrow and given, vss

## 2020-02-12 NOTE — Progress Notes (Signed)
Called to room to assist during endoscopic procedure.  Patient ID and intended procedure confirmed with present staff. Received instructions for my participation in the procedure from the performing physician.  

## 2020-02-12 NOTE — Patient Instructions (Signed)
Resume Xarelto at prior dose in 3 days.  Handout on polyps, and diverticulosis given.   YOU HAD AN ENDOSCOPIC PROCEDURE TODAY AT [facility]:   Refer to the procedure report that was given to you for any specific questions about what was found during the examination.  If the procedure report does not answer your questions, please call your gastroenterologist to clarify.  If you requested that your care partner not be given the details of your procedure findings, then the procedure report has been included in a sealed envelope for you to review at your convenience later.  YOU SHOULD EXPECT: Some feelings of bloating in the abdomen. Passage of more gas than usual.  Walking can help get rid of the air that was put into your GI tract during the procedure and reduce the bloating. If you had a lower endoscopy (such as a colonoscopy or flexible sigmoidoscopy) you may notice spotting of blood in your stool or on the toilet paper. If you underwent a bowel prep for your procedure, you may not have a normal bowel movement for a few days.  Please Note:  You might notice some irritation and congestion in your nose or some drainage.  This is from the oxygen used during your procedure.  There is no need for concern and it should clear up in a day or so.  SYMPTOMS TO REPORT IMMEDIATELY:   Following lower endoscopy (colonoscopy or flexible sigmoidoscopy):  Excessive amounts of blood in the stool  Significant tenderness or worsening of abdominal pains  Swelling of the abdomen that is new, acute  Fever of 100F or higher   For urgent or emergent issues, a gastroenterologist can be reached at any hour by calling [phone]. Do not use MyChart messaging for urgent concerns.    DIET:  We do recommend a small meal at first, but then you may proceed to your regular diet.  Drink plenty of fluids but you should avoid alcoholic beverages for 24 hours.  ACTIVITY:  You should plan to take it easy  for the rest of today and you should NOT DRIVE or use heavy machinery until tomorrow (because of the sedation medicines used during the test).    FOLLOW UP: Our staff will call the number listed on your records 48-72 hours following your procedure to check on you and address any questions or concerns that you may have regarding the information given to you following your procedure. If we do not reach you, we will leave a message.  We will attempt to reach you two times.  During this call, we will ask if you have developed any symptoms of COVID 19. If you develop any symptoms (ie: fever, flu-like symptoms, shortness of breath, cough etc.) before then, please call [phone].  If you test positive for Covid 19 in the 2 weeks post procedure, please call and report this information to [place].    If any biopsies were taken you will be contacted by phone or by letter within the next 1-3 weeks.  Please call us at [phone] if you have not heard about the biopsies in 3 weeks.    SIGNATURES/CONFIDENTIALITY: You and/or your care partner have signed paperwork which will be entered into your electronic medical record.  These signatures attest to the fact that that the information above on your After Visit Summary has been reviewed and is understood.  Full responsibility of the confidentiality of this discharge information lies with you and/or your care-partner.

## 2020-02-14 ENCOUNTER — Telehealth: Payer: Self-pay

## 2020-02-14 NOTE — Telephone Encounter (Signed)
°  Follow up Call-  Call back number 02/12/2020  Post procedure Call Back phone  # [phone]  Permission to leave phone message Yes  Some recent data might be hidden     Patient questions:  Do you have a fever, pain , or abdominal swelling? No. Pain Score  0 *  Have you tolerated food without any problems? Yes.    Have you been able to return to your normal activities? Yes.    Do you have any questions about your discharge instructions: Diet   No. Medications  No. Follow up visit  No.  Do you have questions or concerns about your Care? No.  Actions: * If pain score is 4 or above: 1. No action needed, pain <4.Have you developed a fever since your procedure? no  2.   Have you had an respiratory symptoms (SOB or cough) since your procedure? no  3.   Have you tested positive for COVID 19 since your procedure no  4.   Have you had any family members/close contacts diagnosed with the COVID 19 since your procedure?  no   If yes to any of these questions please route to Joylene John, RN and Erenest Rasher, RN

## 2020-02-19 ENCOUNTER — Encounter: Payer: Self-pay | Admitting: Gastroenterology

## 2020-02-20 ENCOUNTER — Other Ambulatory Visit: Payer: Self-pay

## 2020-02-20 DIAGNOSIS — K529 Noninfective gastroenteritis and colitis, unspecified: Secondary | ICD-10-CM

## 2020-02-20 MED ORDER — DICYCLOMINE HCL 10 MG PO CAPS
10.0000 mg | ORAL_CAPSULE | Freq: Two times a day (BID) | ORAL | 1 refills | Status: DC
Start: 2020-02-20 — End: 2020-04-01

## 2020-02-21 ENCOUNTER — Ambulatory Visit: Payer: Medicare Other | Admitting: Gastroenterology

## 2020-02-21 ENCOUNTER — Telehealth: Payer: Self-pay | Admitting: Gastroenterology

## 2020-02-21 NOTE — Telephone Encounter (Signed)
Pharmacy called, cash price is $22.49.Patient has been notified. Advised he could use a good Rx card, he states he has one of those and will give it a try.

## 2020-02-21 NOTE — Telephone Encounter (Signed)
PA has been submitted with cover my meds [id number]

## 2020-02-21 NOTE — Telephone Encounter (Signed)
Pt is requesting a pre authorization for his Bentyl if possible

## 2020-02-21 NOTE — Telephone Encounter (Signed)
Lawrence Rivas from [facility] is wanting to inform the provider the Bentyl has been denied for pre authorization, if any questions can call back  CB [phone] Opt [phone]

## 2020-02-22 ENCOUNTER — Other Ambulatory Visit: Payer: Medicare Other

## 2020-02-22 DIAGNOSIS — K529 Noninfective gastroenteritis and colitis, unspecified: Secondary | ICD-10-CM | POA: Diagnosis not present

## 2020-02-24 LAB — CLOSTRIDIUM DIFFICILE BY PCR: Toxigenic C. Difficile by PCR: NEGATIVE

## 2020-02-26 ENCOUNTER — Ambulatory Visit (INDEPENDENT_AMBULATORY_CARE_PROVIDER_SITE_OTHER): Payer: Medicare Other | Admitting: *Deleted

## 2020-02-26 DIAGNOSIS — I480 Paroxysmal atrial fibrillation: Secondary | ICD-10-CM

## 2020-02-26 LAB — CUP PACEART REMOTE DEVICE CHECK
Date Time Interrogation Session: 20210813230628
Implantable Pulse Generator Implant Date: 20210301

## 2020-02-27 NOTE — Progress Notes (Signed)
Carelink Summary Report / Loop Recorder 

## 2020-02-28 LAB — PANCREATIC ELASTASE, FECAL: Pancreatic Elastase-1, Stool: 417 mcg/g

## 2020-02-28 LAB — OVA AND PARASITE EXAMINATION
CONCENTRATE RESULT:: NONE SEEN
MICRO NUMBER:: 10819181
SPECIMEN QUALITY:: ADEQUATE
TRICHROME RESULT:: NONE SEEN

## 2020-03-08 ENCOUNTER — Encounter: Payer: Self-pay | Admitting: Internal Medicine

## 2020-03-08 ENCOUNTER — Ambulatory Visit (INDEPENDENT_AMBULATORY_CARE_PROVIDER_SITE_OTHER): Payer: Medicare Other | Admitting: Internal Medicine

## 2020-03-08 ENCOUNTER — Other Ambulatory Visit: Payer: Self-pay

## 2020-03-08 VITALS — BP 114/68 | HR 71 | Ht 72.0 in | Wt 212.0 lb

## 2020-03-08 DIAGNOSIS — I480 Paroxysmal atrial fibrillation: Secondary | ICD-10-CM | POA: Diagnosis not present

## 2020-03-08 DIAGNOSIS — I10 Essential (primary) hypertension: Secondary | ICD-10-CM | POA: Diagnosis not present

## 2020-03-08 NOTE — Patient Instructions (Addendum)
Medication Instructions:  1 stop Xarelto  *If you need a refill on your cardiac medications before your next appointment, please call your pharmacy*  Lab Work: None ordered.  If you have labs (blood work) drawn today and your tests are completely normal, you will receive your results only by: Marland Kitchen MyChart Message (if you have MyChart) OR . A paper copy in the mail If you have any lab test that is abnormal or we need to change your treatment, we will call you to review the results.  Testing/Procedures: None ordered.  Follow-Up: At [facility], you and your health needs are our priority.  As part of our continuing mission to provide you with exceptional heart care, we have created designated Provider Care Teams.  These Care Teams include your primary Cardiologist (physician) and Advanced Practice Providers (APPs -  Physician Assistants and Nurse Practitioners) who all work together to provide you with the care you need, when you need it.  We recommend signing up for the patient portal called "MyChart".  Sign up information is provided on this After Visit Summary.  MyChart is used to connect with patients for Virtual Visits (Telemedicine).  Patients are able to view lab/test results, encounter notes, upcoming appointments, etc.  Non-urgent messages can be sent to your provider as well.   To learn more about what you can do with MyChart, go to [email or web address].    Your next appointment:   Your physician wants you to follow-up in: 06/03/20 at 9:15 at the [address] office.     Other Instructions:

## 2020-03-08 NOTE — Progress Notes (Signed)
PCP: Colon Branch, MD    Lawrence Rivas is a 69 y.o. male who presents today for routine electrophysiology followup.  Since his recent afib ablation, the patient reports doing very well.  he denies procedure related complications and is pleased with the results of the procedure.  Today, he denies symptoms of palpitations, chest pain, shortness of breath,  lower extremity edema, dizziness, presyncope, or syncope.  The patient is otherwise without complaint today.   Past Medical History:  Diagnosis Date  . Erectile dysfunction   . Numbness and tingling    right arm   . Paroxysmal A-fib (HCC)    no recurrence s/p atrial flutter ablation (previously atrial flutter felt to degenerate into afib)  . Psoriasis   . TIA (transient ischemic attack) 2004   Past Surgical History:  Procedure Laterality Date  . A FLUTTER ABLATION     March 2011 at [facility] by Dr Lyanne Co Dixat  . ATRIAL FIBRILLATION ABLATION N/A 11/07/2019   Procedure: ATRIAL FIBRILLATION ABLATION;  Surgeon: Thompson Grayer, MD;  Location: [place] [facility];  Service: Cardiovascular;  Laterality: N/A;  . implantable loop recorder insertion  09/11/2019   Medtronic Reveal Linq model LNQ 22 (SN [id number]) implantable loop recorder implanted by Dr Rayann Heman for afib management and evaluation of palpitations  . TONSILLECTOMY    . TOTAL HIP ARTHROPLASTY     right    ROS- all systems are personally reviewed and negatives except as per HPI above  Current Outpatient Medications  Medication Sig Dispense Refill  . acetaminophen (TYLENOL) 325 MG tablet Take 650 mg by mouth as needed for moderate pain.     Marland Kitchen dicyclomine (BENTYL) 10 MG capsule Take 1 capsule (10 mg total) by mouth in the morning and at bedtime. 45 capsule 1  . diltiazem (CARDIZEM) 30 MG tablet Take 1 tablet (30 mg total) by mouth 2 (two) times daily as needed (Palpitations). 60 tablet 11  . fluticasone (FLONASE) 50 MCG/ACT nasal spray Place 2 sprays into both  nostrils daily as needed for allergies or rhinitis.    . rivaroxaban (XARELTO) 20 MG TABS tablet Take 1 tablet (20 mg total) by mouth daily with supper. 30 tablet 11  . diltiazem (CARDIZEM CD) 240 MG 24 hr capsule Take 1 capsule (240 mg total) by mouth daily. 90 capsule 3   No current facility-administered medications for this visit.    Physical Exam: Vitals:   03/08/20 0859  BP: 114/68  Pulse: 71  SpO2: 96%  Weight: 212 lb (96.2 kg)  Height: 6' (1.829 m)    GEN- The patient is well appearing, alert and oriented x 3 today.   Head- normocephalic, atraumatic Eyes-  Sclera clear, conjunctiva pink Ears- hearing intact Oropharynx- clear Lungs-   normal work of breathing Heart- Regular rate and rhythm  GI- soft  Extremities- no clubbing, cyanosis, or edema  EKG tracing ordered today is personally reviewed and shows sinus with RBBB  Assessment and Plan:  1. Persistent/ paroxysmal atrial fibrillation Doing well s/p ablation chads2vasc score is 1.  He is very clear that he wishes to stop xarelto at this time. He wants to resume ASA 325mg  daily which he takes primarily for arthritis.  I have cautioned about risks of bleeding with high dose ASA and data suggesting very little stroke prevention for AF.  He is aware  2. Tobacco Cessation is advised   0% AF burden by ILR.  He has a $45 copay for ILR monitoring  and is considering discontinuing remote monitoring in the future.  He will continue for now.  Return to see me in 3 months  Thompson Grayer MD, [facility] 03/08/2020 9:20 AM

## 2020-03-11 ENCOUNTER — Encounter: Payer: Medicare Other | Admitting: Internal Medicine

## 2020-03-14 DIAGNOSIS — M25562 Pain in left knee: Secondary | ICD-10-CM | POA: Diagnosis not present

## 2020-03-14 DIAGNOSIS — Z96641 Presence of right artificial hip joint: Secondary | ICD-10-CM | POA: Diagnosis not present

## 2020-03-14 DIAGNOSIS — M25552 Pain in left hip: Secondary | ICD-10-CM | POA: Diagnosis not present

## 2020-03-14 DIAGNOSIS — M25561 Pain in right knee: Secondary | ICD-10-CM | POA: Diagnosis not present

## 2020-03-19 ENCOUNTER — Encounter: Payer: Self-pay | Admitting: Gastroenterology

## 2020-03-19 ENCOUNTER — Ambulatory Visit: Payer: Medicare Other | Admitting: Gastroenterology

## 2020-03-19 VITALS — BP 112/66 | HR 88 | Ht 71.75 in | Wt 209.2 lb

## 2020-03-19 DIAGNOSIS — D126 Benign neoplasm of colon, unspecified: Secondary | ICD-10-CM

## 2020-03-19 DIAGNOSIS — K529 Noninfective gastroenteritis and colitis, unspecified: Secondary | ICD-10-CM | POA: Diagnosis not present

## 2020-03-19 NOTE — Patient Instructions (Signed)
If you are age 70 or older, your body mass index should be between 23-30. Your Body mass index is 28.58 kg/m. If this is out of the aforementioned range listed, please consider follow up with your Primary Care Provider.  If you are age 48 or younger, your body mass index should be between 19-25. Your Body mass index is 28.58 kg/m. If this is out of the aformentioned range listed, please consider follow up with your Primary Care Provider.   Follow up as needed.  It was a pleasure to see you today!  Dr. Loletha Carrow

## 2020-03-19 NOTE — Progress Notes (Signed)
     [facility] Progress Note  Chief Complaint: Chronic diarrhea  Subjective  History: Seen in clinic July 8, reporting about 6 months of diarrhea with no clear trigger.  He did recall it beginning soon after Xarelto was started for atrial fibrillation, but that seemed unlikely to be related.  He would feel well in the morning, then in the afternoon would have some cramps and loose stools, perhaps up to 4 to 5/day. Colonoscopy August 2 found 14 adenomatous and serrated polyps.  Biopsies negative for microscopic colitis. Repeat stool studies negative for C. difficile as well as ova and parasites.  Fecal elastase normal (previous smoker) Dicyclomine prescribed, but Arnette Norris tells me he never needs to take it.  He is inexplicably better after the recent procedure.  He does not know how or why, but he is back to regular bowel pattern with a formed stool once or twice a day.  He no longer has urgency.  He also saw his cardiologist and Xarelto was stopped, but the diarrhea resolved prior to that.  ROS: Cardiovascular:  no chest pain Respiratory: no dyspnea  The patient's Past Medical, Family and Social History were reviewed and are on file in the EMR. Philip's wife is with him today, and they had some questions regarding his colonoscopy and pathology results.  Objective:  Med list reviewed  Current Outpatient Medications:  .  aspirin 325 MG tablet, aspirin 325 mg tablet  Take 1 tablet every day by oral route., Disp: , Rfl:  .  diltiazem (CARDIZEM CD) 240 MG 24 hr capsule, Take 1 capsule (240 mg total) by mouth daily., Disp: 90 capsule, Rfl: 3 .  fluticasone (FLONASE) 50 MCG/ACT nasal spray, Place 2 sprays into both nostrils daily as needed for allergies or rhinitis., Disp: , Rfl:  .  acetaminophen (TYLENOL) 325 MG tablet, Take 650 mg by mouth as needed for moderate pain.  (Patient not taking: Reported on 03/19/2020), Disp: , Rfl:  .  dicyclomine (BENTYL) 10 MG capsule, Take 1 capsule (10  mg total) by mouth in the morning and at bedtime., Disp: 45 capsule, Rfl: 1 .  diltiazem (CARDIZEM) 30 MG tablet, Take 1 tablet (30 mg total) by mouth 2 (two) times daily as needed (Palpitations). (Patient not taking: Reported on 03/19/2020), Disp: 60 tablet, Rfl: 11   Vital signs in last 24 hrs: Vitals:   03/19/20 0813  BP: 112/66  Pulse: 88    No exam, entire visit spent in discussion. Labs: Stool studies as noted above  ___________________________________________ Radiologic studies:   ____________________________________________ Other:  : Pathology results as noted and on file _____________________________________________ Assessment & Plan  Assessment: Encounter Diagnoses  Name Primary?  . Chronic diarrhea Yes  . Adenomatous polyp of colon, unspecified part of colon    Over 6 months of chronic diarrhea, now resolved without clear explanation.  I am glad he is feeling better and he does not need the dicyclomine.  If he has recurrence of that problem, he will let me know and try the dicyclomine.  Multiple adenomatous and serrated colon polyps, recall in 1 year.   20 minutes were spent on this encounter (including chart review, history/exam, counseling/coordination of care, and documentation)  Nelida Meuse III

## 2020-04-01 ENCOUNTER — Ambulatory Visit (INDEPENDENT_AMBULATORY_CARE_PROVIDER_SITE_OTHER): Payer: Medicare Other | Admitting: Internal Medicine

## 2020-04-01 ENCOUNTER — Other Ambulatory Visit: Payer: Self-pay

## 2020-04-01 ENCOUNTER — Ambulatory Visit (INDEPENDENT_AMBULATORY_CARE_PROVIDER_SITE_OTHER): Payer: Medicare Other | Admitting: *Deleted

## 2020-04-01 ENCOUNTER — Encounter: Payer: Self-pay | Admitting: Internal Medicine

## 2020-04-01 VITALS — BP 136/75 | HR 70 | Temp 98.1°F | Resp 18 | Ht 72.0 in | Wt 211.5 lb

## 2020-04-01 DIAGNOSIS — I480 Paroxysmal atrial fibrillation: Secondary | ICD-10-CM | POA: Diagnosis not present

## 2020-04-01 DIAGNOSIS — R399 Unspecified symptoms and signs involving the genitourinary system: Secondary | ICD-10-CM

## 2020-04-01 DIAGNOSIS — Z23 Encounter for immunization: Secondary | ICD-10-CM

## 2020-04-01 DIAGNOSIS — Z0000 Encounter for general adult medical examination without abnormal findings: Secondary | ICD-10-CM

## 2020-04-01 LAB — CUP PACEART REMOTE DEVICE CHECK
Date Time Interrogation Session: 20210915230441
Implantable Pulse Generator Implant Date: 20210301

## 2020-04-01 MED ORDER — SHINGRIX 50 MCG/0.5ML IM SUSR: 0.5000 mL | Freq: Once | INTRAMUSCULAR | 1 refills | Status: AC

## 2020-04-01 NOTE — Progress Notes (Signed)
Subjective:    Patient ID: Lawrence Rivas, male    DOB: [birth year], 69 y.o.   MRN: [id number]  DOS:  04/01/2020 Type of visit - description: CPX In general feeling well but reports stress related to his wife's health. As well, he drinks plenty of water and  nocturia x1 most nights, sometimes feels he is not completely emptying his bladder. Has dysuria very rarely, no gross hematuria.    Review of Systems  Other than above, a 14 point review of systems is negative     Past Medical History:  Diagnosis Date  . Adenomatous colon polyp    TA and SS  . Erectile dysfunction   . Numbness and tingling    right arm   . Paroxysmal A-fib (HCC)    no recurrence s/p atrial flutter ablation (previously atrial flutter felt to degenerate into afib)  . Psoriasis   . TIA (transient ischemic attack) 2004    Past Surgical History:  Procedure Laterality Date  . A FLUTTER ABLATION     March 2011 at [facility] by Dr Lyanne Co Dixat  . ATRIAL FIBRILLATION ABLATION N/A 11/07/2019   Procedure: ATRIAL FIBRILLATION ABLATION;  Surgeon: Thompson Grayer, MD;  Location: [place] [facility];  Service: Cardiovascular;  Laterality: N/A;  . implantable loop recorder insertion  09/11/2019   Medtronic Reveal Linq model LNQ 22 (SN [id number]) implantable loop recorder implanted by Dr Rayann Heman for afib management and evaluation of palpitations  . TONSILLECTOMY    . TOTAL HIP ARTHROPLASTY     right    Allergies as of 04/01/2020      Reactions   Watermelon [citrullus Vulgaris] Itching   Any melon   Coconut Oil Hives      Medication List       Accurate as of April 01, 2020 11:59 PM. If you have any questions, ask your nurse or doctor.        STOP taking these medications   dicyclomine 10 MG capsule Commonly known as: BENTYL Stopped by: Kathlene November, MD     TAKE these medications   acetaminophen 325 MG tablet Commonly known as: TYLENOL Take 650 mg by mouth as needed for moderate pain.    aspirin 325 MG tablet aspirin 325 mg tablet  Take 1 tablet every day by oral route.   diltiazem 240 MG 24 hr capsule Commonly known as: CARDIZEM CD Take 1 capsule (240 mg total) by mouth daily.   diltiazem 30 MG tablet Commonly known as: CARDIZEM Take 1 tablet (30 mg total) by mouth 2 (two) times daily as needed (Palpitations).   fluticasone 50 MCG/ACT nasal spray Commonly known as: FLONASE Place 2 sprays into both nostrils daily as needed for allergies or rhinitis.   Shingrix injection Generic drug: Zoster Vaccine Adjuvanted Inject 0.5 mLs into the muscle once for 1 dose. Started by: Kathlene November, MD          Objective:   Physical Exam BP 136/75 (BP Location: Left Arm, Patient Position: Sitting, Cuff Size: Normal)   Pulse 70   Temp 98.1 F (36.7 C) (Oral)   Resp 18   Ht 6' (1.829 m)   Wt 211 lb 8 oz (95.9 kg)   SpO2 95%   BMI 28.68 kg/m  General: Well developed, NAD, BMI noted Neck: No  thyromegaly  HEENT:  Normocephalic . Face symmetric, atraumatic Lungs:  CTA B Normal respiratory effort, no intercostal retractions, no accessory muscle use. Heart: RRR,  no murmur.  Abdomen:  Not distended, soft, non-tender. No rebound or rigidity. DRE: Few skin tags outside, sphincter normal, brown stools, prostate normal size, not tender Lower extremities: no pretibial edema bilaterally  Skin: Exposed areas without rash. Not pale. Not jaundice Neurologic:  alert & oriented X3.  Speech normal, gait appropriate for age and unassisted Strength symmetric and appropriate for age.  Psych: Cognition and judgment appear intact.  Cooperative with normal attention span and concentration.  Behavior appropriate. No anxious or depressed appearing.     Assessment     Assessment P- Atrial fibrillation: Ablation remotely, symptoms resurfaced 2021, loop implantation,  started Xarelto 09-2019, had an ablation April 2021.  Xarelto DC 02-2020 Tobacco abuse Erectal dysfunction TIA 2004     PLAN Here for CPX Paroxysmal A. fib: Status post ablation April 2021, saw cardiology 02-2020, EKG: Sinus rhythm.  Patient was very clear about stopping Xarelto and liked to take aspirin 325 mg, he is aware of risk of bleeding and limited stroke prevention w/ ASA Diarrhea:  saw GI 03/19/2020, was seen with chronic diarrhea which resolved without a clear explanation. Had a cscope 02/2020, next  1 year Stress related to his wife's health, listening therapy provided RTC 1 year   This visit occurred during the SARS-CoV-2 public health emergency.  Safety protocols were in place, including screening questions prior to the visit, additional usage of staff PPE, and extensive cleaning of exam room while observing appropriate contact time as indicated for disinfecting solutions.

## 2020-04-01 NOTE — Patient Instructions (Addendum)
Get your shingles shot when ready.  Get your COVID-19 booster, needs to be 2 weeks away from any other vaccinations  Take aspirin with food  GO TO THE LAB : Get the blood work     [place], [place] back for a physical exam in 1 year

## 2020-04-01 NOTE — Progress Notes (Signed)
Pre visit review using our clinic review tool, if applicable. No additional management support is needed unless otherwise documented below in the visit note. 

## 2020-04-02 ENCOUNTER — Encounter: Payer: Self-pay | Admitting: Internal Medicine

## 2020-04-02 DIAGNOSIS — Z09 Encounter for follow-up examination after completed treatment for conditions other than malignant neoplasm: Secondary | ICD-10-CM | POA: Insufficient documentation

## 2020-04-02 LAB — BASIC METABOLIC PANEL
BUN: 19 mg/dL (ref 7–25)
CO2: 26 mmol/L (ref 20–32)
Calcium: 10 mg/dL (ref 8.6–10.3)
Chloride: 103 mmol/L (ref 98–110)
Creat: 0.82 mg/dL (ref 0.70–1.25)
Glucose, Bld: 92 mg/dL (ref 65–99)
Potassium: 4.6 mmol/L (ref 3.5–5.3)
Sodium: 137 mmol/L (ref 135–146)

## 2020-04-02 LAB — LIPID PANEL
Cholesterol: 168 mg/dL (ref <200)
HDL: 81 mg/dL (ref >=40)
LDL Cholesterol (Calc): 68 mg/dL (calc)
Non-HDL Cholesterol (Calc): 87 mg/dL (calc) (ref <130)
Total CHOL/HDL Ratio: 2.1 (calc) (ref <5.0)
Triglycerides: 105 mg/dL (ref <150)

## 2020-04-02 LAB — PSA: PSA: 0.19 ng/mL (ref <=4.0)

## 2020-04-02 NOTE — Assessment & Plan Note (Signed)
Td 2012 Zoster 2014  Shingrix: d/w , rx printed  PNM : 23 2012 PNM 13: Today had C-19 shots , plans to have a booster (2 weeks apart from other shots) flu shot today CCS: Reports a Cscope 2009, elsewhere, "benign polyps", cscope 02/2020, multiple polyps, next 1 year  Prostate ca screening: DRE normal, he does have LUTS, possibly related to drink lots of fluids.  We will check a UA, urine culture, PSA Diet, exercise: Discussed Tobacco: Not ready to quit, encouraged to think about it. Labs: BMP, FLP, PSA, UA urine culture

## 2020-04-02 NOTE — Progress Notes (Signed)
Carelink Summary Report / Loop Recorder 

## 2020-04-02 NOTE — Assessment & Plan Note (Signed)
Here for CPX Paroxysmal A. fib: Status post ablation April 2021, saw cardiology 02-2020, EKG: Sinus rhythm.  Patient was very clear about stopping Xarelto and liked to take aspirin 325 mg, he is aware of risk of bleeding and limited stroke prevention w/ ASA Diarrhea:  saw GI 03/19/2020, was seen with chronic diarrhea which resolved without a clear explanation. Had a cscope 02/2020, next  1 year Stress related to his wife's health, listening therapy provided RTC 1 year

## 2020-04-03 LAB — URINE CULTURE
MICRO NUMBER:: 10974294
Result:: NO GROWTH
SPECIMEN QUALITY:: ADEQUATE

## 2020-04-03 LAB — URINALYSIS, ROUTINE W REFLEX MICROSCOPIC
Bacteria, UA: NONE SEEN /HPF
Bilirubin Urine: NEGATIVE
Glucose, UA: NEGATIVE
Hgb urine dipstick: NEGATIVE
Hyaline Cast: NONE SEEN /LPF
Ketones, ur: NEGATIVE
Leukocytes,Ua: NEGATIVE
Nitrite: NEGATIVE
Specific Gravity, Urine: 1.027 (ref 1.001–1.03)
Squamous Epithelial / HPF: NONE SEEN /HPF (ref <=5)
WBC, UA: NONE SEEN /HPF (ref 0–5)
pH: 5.5 (ref 5.0–8.0)

## 2020-04-17 ENCOUNTER — Ambulatory Visit: Payer: Medicare Other | Admitting: Dermatology

## 2020-04-30 LAB — CUP PACEART REMOTE DEVICE CHECK
Date Time Interrogation Session: 20211018230634
Implantable Pulse Generator Implant Date: 20210301

## 2020-05-06 ENCOUNTER — Ambulatory Visit (INDEPENDENT_AMBULATORY_CARE_PROVIDER_SITE_OTHER): Payer: Medicare Other

## 2020-05-06 DIAGNOSIS — I480 Paroxysmal atrial fibrillation: Secondary | ICD-10-CM | POA: Diagnosis not present

## 2020-05-09 NOTE — Progress Notes (Signed)
Carelink Summary Report / Loop Recorder 

## 2020-05-28 NOTE — Telephone Encounter (Signed)
Error

## 2020-06-03 ENCOUNTER — Ambulatory Visit: Payer: Medicare Other | Admitting: Internal Medicine

## 2020-06-03 ENCOUNTER — Encounter: Payer: Self-pay | Admitting: Internal Medicine

## 2020-06-03 ENCOUNTER — Other Ambulatory Visit: Payer: Self-pay

## 2020-06-03 VITALS — BP 128/70 | HR 74 | Ht 72.0 in | Wt 210.6 lb

## 2020-06-03 DIAGNOSIS — I480 Paroxysmal atrial fibrillation: Secondary | ICD-10-CM | POA: Diagnosis not present

## 2020-06-03 DIAGNOSIS — I10 Essential (primary) hypertension: Secondary | ICD-10-CM | POA: Diagnosis not present

## 2020-06-03 MED ORDER — DILTIAZEM HCL ER COATED BEADS 120 MG PO CP24: 120.0000 mg | ORAL_CAPSULE | Freq: Every day | ORAL | 3 refills | Status: DC

## 2020-06-03 NOTE — Patient Instructions (Addendum)
Medication Instructions:  Decrease your Diltiazem 240 mg to 120 mg   *If you need a refill on your cardiac medications before your next appointment, please call your pharmacy*  Lab Work: None ordered.  If you have labs (blood work) drawn today and your tests are completely normal, you will receive your results only by: Marland Kitchen MyChart Message (if you have MyChart) OR . A paper copy in the mail If you have any lab test that is abnormal or we need to change your treatment, we will call you to review the results.  Testing/Procedures: None ordered.  Follow-Up: At [facility], you and your health needs are our priority.  As part of our continuing mission to provide you with exceptional heart care, we have created designated Provider Care Teams.  These Care Teams include your primary Cardiologist (physician) and Advanced Practice Providers (APPs -  Physician Assistants and Nurse Practitioners) who all work together to provide you with the care you need, when you need it.  We recommend signing up for the patient portal called "MyChart".  Sign up information is provided on this After Visit Summary.  MyChart is used to connect with patients for Virtual Visits (Telemedicine).  Patients are able to view lab/test results, encounter notes, upcoming appointments, etc.  Non-urgent messages can be sent to your provider as well.   To learn more about what you can do with MyChart, go to [email or web address].    Your next appointment:   Your physician wants you to follow-up in: 6 months with Dr. Rayann Heman. You will receive a reminder letter in the mail two months in advance. If you don't receive a letter, please call our office to schedule the follow-up appointment.    Other Instructions:

## 2020-06-03 NOTE — Progress Notes (Signed)
PCP: Colon Branch, MD   Primary EP: [name] is a 69 y.o. male who presents today for routine electrophysiology followup.  Since last being seen in our clinic, the patient reports doing very well.  Today, he denies symptoms of palpitations, chest pain, shortness of breath,  lower extremity edema, dizziness, presyncope, or syncope.  The patient is otherwise without complaint today.   Past Medical History:  Diagnosis Date  . Adenomatous colon polyp    TA and SS  . Erectile dysfunction   . Numbness and tingling    right arm   . Paroxysmal A-fib (HCC)    no recurrence s/p atrial flutter ablation (previously atrial flutter felt to degenerate into afib)  . Psoriasis   . TIA (transient ischemic attack) 2004   Past Surgical History:  Procedure Laterality Date  . A FLUTTER ABLATION     March 2011 at [facility] by Dr Lyanne Co Dixat  . ATRIAL FIBRILLATION ABLATION N/A 11/07/2019   Procedure: ATRIAL FIBRILLATION ABLATION;  Surgeon: Thompson Grayer, MD;  Location: [place] [facility];  Service: Cardiovascular;  Laterality: N/A;  . implantable loop recorder insertion  09/11/2019   Medtronic Reveal Linq model LNQ 22 (SN [id number]) implantable loop recorder implanted by Dr Rayann Heman for afib management and evaluation of palpitations  . TONSILLECTOMY    . TOTAL HIP ARTHROPLASTY     right    ROS- all systems are reviewed and negatives except as per HPI above  Current Outpatient Medications  Medication Sig Dispense Refill  . acetaminophen (TYLENOL) 325 MG tablet Take 650 mg by mouth as needed for moderate pain.  (Patient not taking: Reported on 03/19/2020)    . aspirin 325 MG tablet aspirin 325 mg tablet  Take 1 tablet every day by oral route.    . diltiazem (CARDIZEM CD) 240 MG 24 hr capsule Take 1 capsule (240 mg total) by mouth daily. 90 capsule 3  . diltiazem (CARDIZEM) 30 MG tablet Take 1 tablet (30 mg total) by mouth 2 (two) times daily as needed (Palpitations).  (Patient not taking: Reported on 03/19/2020) 60 tablet 11  . fluticasone (FLONASE) 50 MCG/ACT nasal spray Place 2 sprays into both nostrils daily as needed for allergies or rhinitis.     No current facility-administered medications for this visit.    Physical Exam: Vitals:   06/03/20 0937  BP: 128/70  Pulse: 74  SpO2: 95%  Weight: 210 lb 9.6 oz (95.5 kg)  Height: 6' (1.829 m)    GEN- The patient is well appearing, alert and oriented x 3 today.   Head- normocephalic, atraumatic Eyes-  Sclera clear, conjunctiva pink Ears- hearing intact Oropharynx- clear Lungs- Clear to ausculation bilaterally, normal work of breathing Heart- Regular rate and rhythm, no murmurs, rubs or gallops, PMI not laterally displaced GI- soft, NT, ND, + BS Extremities- no clubbing, cyanosis, or edema  Wt Readings from Last 3 Encounters:  06/03/20 210 lb 9.6 oz (95.5 kg)  04/01/20 211 lb 8 oz (95.9 kg)  03/19/20 209 lb 4 oz (94.9 kg)    EKG tracing ordered today is personally reviewed and shows sinus rhythm with RBBB  Assessment and Plan:  1. Paroxysmal atrial fibrillation S/p ablation off AAD therapy Reduce diltiazem CD to 120mg  daily afib burden is 0% by ILR chads2vasc score is 1.  He does not wish to take [facility] but takes ASA 325mg  daily for arthritis  2. Tobacco Cessation again advised  Risks, benefits and  potential toxicities for medications prescribed and/or refilled reviewed with patient today.   Thompson Grayer MD, [facility] 06/03/2020 9:56 AM

## 2020-06-07 LAB — CUP PACEART REMOTE DEVICE CHECK
Date Time Interrogation Session: 20211120230135
Implantable Pulse Generator Implant Date: 20210301

## 2020-06-10 ENCOUNTER — Ambulatory Visit (INDEPENDENT_AMBULATORY_CARE_PROVIDER_SITE_OTHER): Payer: Medicare Other

## 2020-06-10 DIAGNOSIS — I480 Paroxysmal atrial fibrillation: Secondary | ICD-10-CM | POA: Diagnosis not present

## 2020-06-14 NOTE — Progress Notes (Signed)
Carelink Summary Report / Loop Recorder 

## 2020-07-15 ENCOUNTER — Ambulatory Visit (INDEPENDENT_AMBULATORY_CARE_PROVIDER_SITE_OTHER): Payer: Medicare Other

## 2020-07-15 DIAGNOSIS — I480 Paroxysmal atrial fibrillation: Secondary | ICD-10-CM | POA: Diagnosis not present

## 2020-07-16 LAB — CUP PACEART REMOTE DEVICE CHECK
Date Time Interrogation Session: 20220101230229
Implantable Pulse Generator Implant Date: 20210301

## 2020-07-30 NOTE — Progress Notes (Signed)
Carelink Summary Report / Loop Recorder 

## 2020-08-16 LAB — CUP PACEART REMOTE DEVICE CHECK
Date Time Interrogation Session: 20220204062649
Implantable Pulse Generator Implant Date: 20210301

## 2020-08-19 ENCOUNTER — Ambulatory Visit (INDEPENDENT_AMBULATORY_CARE_PROVIDER_SITE_OTHER): Payer: Medicare Other

## 2020-08-19 DIAGNOSIS — I480 Paroxysmal atrial fibrillation: Secondary | ICD-10-CM

## 2020-08-23 NOTE — Progress Notes (Signed)
Carelink Summary Report / Loop Recorder 

## 2020-09-09 DIAGNOSIS — H524 Presbyopia: Secondary | ICD-10-CM | POA: Diagnosis not present

## 2020-09-23 ENCOUNTER — Ambulatory Visit (INDEPENDENT_AMBULATORY_CARE_PROVIDER_SITE_OTHER): Payer: Medicare Other

## 2020-09-23 DIAGNOSIS — I480 Paroxysmal atrial fibrillation: Secondary | ICD-10-CM

## 2020-09-25 LAB — CUP PACEART REMOTE DEVICE CHECK
Date Time Interrogation Session: 20220308230247
Implantable Pulse Generator Implant Date: 20210301

## 2020-09-27 ENCOUNTER — Telehealth: Payer: Self-pay | Admitting: Internal Medicine

## 2020-09-27 MED ORDER — FLUTICASONE PROPIONATE 50 MCG/ACT NA SUSP: 2.0000 | Freq: Every day | NASAL | 5 refills | Status: DC | PRN

## 2020-09-27 NOTE — Telephone Encounter (Signed)
Rx sent 

## 2020-09-27 NOTE — Telephone Encounter (Signed)
Medication: fluticasone (FLONASE) 50 MCG/ACT nasal spray [[id number]]     Has the patient contacted their pharmacy? no (If no, request that the patient contact the pharmacy for the refill.) (If yes, when and what did the pharmacy advise?)    Preferred Pharmacy (with phone number or street name):   [id number] [address], [place] [address] Phone:  [phone]  Fax:  [phone]         Agent: Please be advised that RX refills may take up to 3 business days. We ask that you follow-up with your pharmacy.

## 2020-10-02 NOTE — Progress Notes (Signed)
Carelink Summary Report / Loop Recorder 

## 2020-10-28 ENCOUNTER — Ambulatory Visit (INDEPENDENT_AMBULATORY_CARE_PROVIDER_SITE_OTHER): Payer: Medicare Other

## 2020-10-28 DIAGNOSIS — I480 Paroxysmal atrial fibrillation: Secondary | ICD-10-CM

## 2020-10-30 LAB — CUP PACEART REMOTE DEVICE CHECK
Date Time Interrogation Session: 20220418080239
Implantable Pulse Generator Implant Date: 20210301

## 2020-11-11 ENCOUNTER — Other Ambulatory Visit: Payer: Self-pay

## 2020-11-11 ENCOUNTER — Telehealth (INDEPENDENT_AMBULATORY_CARE_PROVIDER_SITE_OTHER): Payer: Medicare Other | Admitting: Internal Medicine

## 2020-11-11 ENCOUNTER — Encounter: Payer: Self-pay | Admitting: Internal Medicine

## 2020-11-11 VITALS — Ht 72.0 in | Wt 212.0 lb

## 2020-11-11 DIAGNOSIS — B349 Viral infection, unspecified: Secondary | ICD-10-CM

## 2020-11-11 NOTE — Progress Notes (Signed)
Subjective:    Patient ID: Lawrence Rivas, male    DOB: 1951/05/14, 70 y.o.   MRN: [id number]  DOS:  11/11/2020 Type of visit - description: Virtual Visit via Telephone    I connected with above mentioned patient  by telephone and verified that I am speaking with the correct person using two identifiers.  THIS ENCOUNTER IS A VIRTUAL VISIT DUE TO COVID-19 - PATIENT WAS NOT SEEN IN THE OFFICE. PATIENT HAS CONSENTED TO VIRTUAL VISIT / TELEMEDICINE VISIT   Location of patient: home  Location of provider: office  Persons participating in the virtual visit: patient, provider   I discussed the limitations, risks, security and privacy concerns of performing an evaluation and management service by telephone and the availability of in person appointments. I also discussed with the patient that there may be a patient responsible charge related to this service. The patient expressed understanding and agreed to proceed.  Acute 1 week history of fatigue, generalized body aches (like after being very physically active). + Chills, some sweats.  Has checked his temperature: No fever No sick contacts Has been is sleeping as much as 8 or 10 hours every day.    Review of Systems Denies cough other than his normal allergies No nausea, vomiting, diarrhea.  No constipation.  Appetite p.o. intake normal. Minimal headache if any. No rash No tick bite No palpitations  Past Medical History:  Diagnosis Date  . Adenomatous colon polyp    TA and SS  . Erectile dysfunction   . Numbness and tingling    right arm   . Paroxysmal A-fib (HCC)    no recurrence s/p atrial flutter ablation (previously atrial flutter felt to degenerate into afib)  . Psoriasis   . TIA (transient ischemic attack) 2004    Past Surgical History:  Procedure Laterality Date  . A FLUTTER ABLATION     March 2011 at [facility] by Dr Lyanne Co Dixat  . ATRIAL FIBRILLATION ABLATION N/A 11/07/2019   Procedure: ATRIAL  FIBRILLATION ABLATION;  Surgeon: Thompson Grayer, MD;  Location: [place] [facility];  Service: Cardiovascular;  Laterality: N/A;  . implantable loop recorder insertion  09/11/2019   Medtronic Reveal Linq model LNQ 22 (SN [id number]) implantable loop recorder implanted by Dr Rayann Heman for afib management and evaluation of palpitations  . TONSILLECTOMY    . TOTAL HIP ARTHROPLASTY     right    Allergies as of 11/11/2020      Reactions   Watermelon [citrullus Vulgaris] Itching   Any melon   Coconut Oil Hives      Medication List       Accurate as of Nov 11, 2020  9:04 AM. If you have any questions, ask your nurse or doctor.        STOP taking these medications   acetaminophen 325 MG tablet Commonly known as: TYLENOL Stopped by: Kathlene November, MD     TAKE these medications   aspirin 325 MG tablet aspirin 325 mg tablet  Take 1 tablet every day by oral route.   diltiazem 120 MG 24 hr capsule Commonly known as: CARDIZEM CD Take 1 capsule (120 mg total) by mouth daily.   diltiazem 30 MG tablet Commonly known as: CARDIZEM Take 1 tablet (30 mg total) by mouth 2 (two) times daily as needed (Palpitations).   fluticasone 50 MCG/ACT nasal spray Commonly known as: FLONASE Place 2 sprays into both nostrils daily as needed for allergies or rhinitis.  Objective:   Physical Exam Ht 6' (1.829 m)   Wt 212 lb (96.2 kg)   BMI 28.75 kg/m  This is  a telephone evaluation, alert oriented x3, in no distress, speaking in complete sentences Blood pressure or O2 sats not available.     Assessment       Assessment P- Atrial fibrillation: Ablation remotely, symptoms resurfaced 2021, loop implantation,  started Xarelto 09-2019, had an ablation April 2021.  Xarelto DC 02-2020 Tobacco abuse Erectal dysfunction TIA 2004    PLAN Viral syndrome: Suspect SX are d/t a viral syndrome. He had 2 COVID vaccines. Plan: Check COVID test, if positive let me know, if negative will recommend to give  himself a couple of days, if not better, can be seen in person here at the office. Recommend hydration, check BPs.  He does not have a pulse oximeter. Will notify cardiology, he might benefit from taking a look his loop implant data (back on A. fib?). Patient verbalized understanding of all of the above   I discussed the assessment and treatment plan with the patient. The patient was provided an opportunity to ask questions and all were answered. The patient agreed with the plan and demonstrated an understanding of the instructions.   The patient was advised to call back or seek an in-person evaluation if the symptoms worsen or if the condition fails to improve as anticipated.  I provided 20 minutes of non-face-to-face time during this encounter.  Kathlene November, MD

## 2020-11-12 ENCOUNTER — Telehealth: Payer: Self-pay | Admitting: Internal Medicine

## 2020-11-12 NOTE — Telephone Encounter (Signed)
Caller Lawrence Rivas  Call Back @ [phone]  Patient called in reference  to recent my chart visit, patient states he took a covid test as instructed by PCP, patient is negative for Covid. Patient is still fatigued and sweating.   Patient would like to come in for labs    Please advise

## 2020-11-12 NOTE — Telephone Encounter (Signed)
If he has severe symptoms, fever chills: Needs to be seen ASAP. Schedule a visit with me on 11/14/2020 at 11:20 AM. If he is still feeling unwell I can see him then, if he is better advised to call and cancel.

## 2020-11-12 NOTE — Telephone Encounter (Signed)
Spoke w/ Pt- appt scheduled.  

## 2020-11-12 NOTE — Telephone Encounter (Signed)
Please advise 

## 2020-11-12 NOTE — Assessment & Plan Note (Signed)
Viral syndrome: Suspect SX are d/t a viral syndrome. He had 2 COVID vaccines. Plan: Check COVID test, if positive let me know, if negative will recommend to give himself a couple of days, if not better, can be seen in person here at the office. Recommend hydration, check BPs.  He does not have a pulse oximeter. Will notify cardiology, he might benefit from taking a look his loop implant data (back on A. fib?). Patient verbalized understanding of all of the above

## 2020-11-13 ENCOUNTER — Telehealth: Payer: Self-pay

## 2020-11-13 NOTE — Telephone Encounter (Signed)
Attempted to contact patient to assess for symptoms. No answer, LMOVM.

## 2020-11-13 NOTE — Progress Notes (Signed)
Carelink Summary Report / Loop Recorder 

## 2020-11-13 NOTE — Telephone Encounter (Signed)
-----   Message from Colon Branch, MD sent at 11/11/2020  9:18 AM EDT ----- Regarding: Viral syndrome Benjamine Mola, our mutual patient is having a number of symptoms including fatigue.  It may be warranted to check his loop implant to be sure he remains in sinus rhythm.  Thank you for your consideration  JP

## 2020-11-14 ENCOUNTER — Ambulatory Visit: Payer: Medicare Other | Admitting: Internal Medicine

## 2020-11-14 ENCOUNTER — Other Ambulatory Visit: Payer: Self-pay

## 2020-11-14 ENCOUNTER — Ambulatory Visit (HOSPITAL_BASED_OUTPATIENT_CLINIC_OR_DEPARTMENT_OTHER)
Admission: RE | Admit: 2020-11-14 | Discharge: 2020-11-14 | Disposition: A | Discharge status: Home / Self Care | Payer: Medicare Other | Source: Ambulatory Visit | Attending: Internal Medicine | Admitting: Internal Medicine

## 2020-11-14 ENCOUNTER — Encounter: Payer: Self-pay | Admitting: Internal Medicine

## 2020-11-14 VITALS — BP 122/72 | HR 86 | Temp 98.2°F | Resp 16 | Ht 72.0 in | Wt 207.5 lb

## 2020-11-14 DIAGNOSIS — M2550 Pain in unspecified joint: Secondary | ICD-10-CM | POA: Diagnosis not present

## 2020-11-14 DIAGNOSIS — R5383 Other fatigue: Secondary | ICD-10-CM | POA: Diagnosis not present

## 2020-11-14 DIAGNOSIS — I4891 Unspecified atrial fibrillation: Secondary | ICD-10-CM | POA: Insufficient documentation

## 2020-11-14 LAB — COMPREHENSIVE METABOLIC PANEL
ALT: 128 U/L — ABNORMAL HIGH (ref 0–53)
AST: 80 U/L — ABNORMAL HIGH (ref 0–37)
Albumin: 3 g/dL — ABNORMAL LOW (ref 3.5–5.2)
Alkaline Phosphatase: 70 U/L (ref 39–117)
BUN: 15 mg/dL (ref 6–23)
CO2: 28 mEq/L (ref 19–32)
Calcium: 8.6 mg/dL (ref 8.4–10.5)
Chloride: 98 mEq/L (ref 96–112)
Creatinine, Ser: 0.72 mg/dL (ref 0.40–1.50)
GFR: 92.54 mL/min (ref >60.00)
Glucose, Bld: 110 mg/dL — ABNORMAL HIGH (ref 70–99)
Potassium: 4.6 mEq/L (ref 3.5–5.1)
Sodium: 134 mEq/L — ABNORMAL LOW (ref 135–145)
Total Bilirubin: 0.5 mg/dL (ref 0.2–1.2)
Total Protein: 6.7 g/dL (ref 6.0–8.3)

## 2020-11-14 LAB — CBC WITH DIFFERENTIAL/PLATELET
Basophils Absolute: 0.1 10*3/uL (ref 0.0–0.1)
Basophils Relative: 0.8 % (ref 0.0–3.0)
Eosinophils Absolute: 0.1 10*3/uL (ref 0.0–0.7)
Eosinophils Relative: 1.5 % (ref 0.0–5.0)
HCT: 37.6 % — ABNORMAL LOW (ref 39.0–52.0)
Hemoglobin: 12.9 g/dL — ABNORMAL LOW (ref 13.0–17.0)
Lymphocytes Relative: 19 % (ref 12.0–46.0)
Lymphs Abs: 1.6 10*3/uL (ref 0.7–4.0)
MCHC: 34.4 g/dL (ref 30.0–36.0)
MCV: 101.3 fl — ABNORMAL HIGH (ref 78.0–100.0)
Monocytes Absolute: 1.2 10*3/uL — ABNORMAL HIGH (ref 0.1–1.0)
Monocytes Relative: 13.5 % — ABNORMAL HIGH (ref 3.0–12.0)
Neutro Abs: 5.6 10*3/uL (ref 1.4–7.7)
Neutrophils Relative %: 65.2 % (ref 43.0–77.0)
Platelets: 373 10*3/uL (ref 150.0–400.0)
RBC: 3.71 Mil/uL — ABNORMAL LOW (ref 4.22–5.81)
RDW: 12.5 % (ref 11.5–15.5)
WBC: 8.7 10*3/uL (ref 4.0–10.5)

## 2020-11-14 LAB — SEDIMENTATION RATE: Sed Rate: 37 mm/hr — ABNORMAL HIGH (ref 0–20)

## 2020-11-14 LAB — TSH: TSH: 1.67 u[IU]/mL (ref 0.35–4.50)

## 2020-11-14 LAB — EKG 12-LEAD

## 2020-11-14 LAB — CK: Total CK: 47 U/L (ref 7–232)

## 2020-11-14 NOTE — Progress Notes (Signed)
Subjective:    Patient ID: Lawrence Rivas, male    DOB: 09/01/1950, 70 y.o.   MRN: [id number]  DOS:  11/14/2020 Type of visit - description: Acute, here with his wife  He was feeling well up until April 17 when he developed: Severe, persistent fatigue associated with feeling sleepy. In addition to his normal DJD knee and hip pain, he developed elbows, shoulder, ankle pain as well as myalgias.  He was seen here virtually 11/11/2020 for those symptoms, he reported no fever, some chills. Subsequently a COVID test was negative.  She is here because he continued with symptoms  Wt Readings from Last 3 Encounters:  11/14/20 207 lb 8 oz (94.1 kg)  11/11/20 212 lb (96.2 kg)  06/03/20 210 lb 9.6 oz (95.5 kg)     Review of Systems Mild weight loss, few pounds, prior to the onset of symptoms he was trying to be very active, walking up to 7 miles without any problems. Since symptoms started, he had no energy or desire to go walking. No runny nose or sore throat No chest pain no difficulty breathing No edema or palpitations No nausea, vomiting.  No blood in the stools. No cough Has not noticed any tick bite although he does take walks on wooden areas. No rash, no easy bleeding. Denies amaurosis fugax or jaw claudication Has a mild headache on and off. No dizziness, diplopia, slurred speech or motor deficits  Past Medical History:  Diagnosis Date  . Adenomatous colon polyp    TA and SS  . Erectile dysfunction   . Numbness and tingling    right arm   . Paroxysmal A-fib (HCC)    no recurrence s/p atrial flutter ablation (previously atrial flutter felt to degenerate into afib)  . Psoriasis   . TIA (transient ischemic attack) 2004    Past Surgical History:  Procedure Laterality Date  . A FLUTTER ABLATION     March 2011 at [facility] by Dr Lyanne Co Dixat  . ATRIAL FIBRILLATION ABLATION N/A 11/07/2019   Procedure: ATRIAL FIBRILLATION ABLATION;  Surgeon: Thompson Grayer, MD;   Location: [place] [facility];  Service: Cardiovascular;  Laterality: N/A;  . implantable loop recorder insertion  09/11/2019   Medtronic Reveal Linq model LNQ 22 (SN [id number]) implantable loop recorder implanted by Dr Rayann Heman for afib management and evaluation of palpitations  . TONSILLECTOMY    . TOTAL HIP ARTHROPLASTY     right    Allergies as of 11/14/2020      Reactions   Watermelon [citrullus Vulgaris] Itching   Any melon   Coconut Oil Hives      Medication List       Accurate as of Nov 14, 2020 11:59 PM. If you have any questions, ask your nurse or doctor.        aspirin 325 MG tablet aspirin 325 mg tablet  Take 1 tablet every day by oral route.   diltiazem 120 MG 24 hr capsule Commonly known as: CARDIZEM CD Take 1 capsule (120 mg total) by mouth daily.   diltiazem 30 MG tablet Commonly known as: CARDIZEM Take 1 tablet (30 mg total) by mouth 2 (two) times daily as needed (Palpitations).   fluticasone 50 MCG/ACT nasal spray Commonly known as: FLONASE Place 2 sprays into both nostrils daily as needed for allergies or rhinitis.          Objective:   Physical Exam BP 122/72 (BP Location: Left Arm, Patient Position: Sitting, Cuff Size:  Normal)   Pulse 86   Temp 98.2 F (36.8 C) (Oral)   Resp 16   Ht 6' (1.829 m)   Wt 207 lb 8 oz (94.1 kg)   SpO2 96%   BMI 28.14 kg/m  General:   Well developed, NAD, BMI noted.  HEENT:  Normocephalic . Face symmetric, atraumatic Neck: No thyromegaly Lungs:  CTA B Normal respiratory effort, no intercostal retractions, no accessory muscle use. Heart: RRR,  no murmur.  Abdomen:  Not distended, soft, non-tender. No rebound or rigidity.   Skin: Not pale. Not jaundice Lower extremities: no pretibial edema bilaterally MSK: Hands and wrists without synovitis Neurologic:  alert & oriented X3.  Speech normal, gait appropriate for age and unassisted Psych--  Cognition and judgment appear intact.  Cooperative with normal  attention span and concentration.  Behavior appropriate. No anxious or depressed appearing.     Assessment     Assessment P- Atrial fibrillation: Ablation remotely, symptoms resurfaced 2021, loop implantation,  started Xarelto 09-2019, had an ablation April 2021.  Xarelto DC 02-2020 Tobacco abuse Erectal dysfunction TIA 2004    PLAN Profound fatigue, feeling sleepy, myalgias, arthralgias: Symptoms started rather abruptly on April 17. Seen virtually here 11/11/2020, a COVID test was negative, here today for in person evaluation. Exam benign, VS stable, he does not look toxic Epworth scale: 11 PHQ-9: 2 EKG today sinus rhythm. DDx is large but includes PMR, post viral syndrome, tickborne diseases, or others. Doubt OSA given acute onset of symptoms. Plan: CMP, CBC, TSH, sed rate, total CK, Lyme serology, chest x-ray. Further advised with results.  Reassess in 4 weeks. Atrial fibrillation: EKG sinus rhythm, unchanged from previous RTC 4 weeks  This visit occurred during the SARS-CoV-2 public health emergency.  Safety protocols were in place, including screening questions prior to the visit, additional usage of staff PPE, and extensive cleaning of exam room while observing appropriate contact time as indicated for disinfecting solutions.

## 2020-11-14 NOTE — Patient Instructions (Addendum)
GO TO THE LAB : Get the blood work     [place], Lupus Come back for a checkup in 4 weeks  Chest x-ray at the first floor

## 2020-11-15 ENCOUNTER — Other Ambulatory Visit: Payer: Self-pay | Admitting: *Deleted

## 2020-11-15 ENCOUNTER — Telehealth: Payer: Self-pay | Admitting: *Deleted

## 2020-11-15 ENCOUNTER — Telehealth: Payer: Self-pay

## 2020-11-15 ENCOUNTER — Other Ambulatory Visit: Payer: Medicare Other

## 2020-11-15 DIAGNOSIS — R7989 Other specified abnormal findings of blood chemistry: Secondary | ICD-10-CM

## 2020-11-15 LAB — TIQ-MISC

## 2020-11-15 MED ORDER — DOXYCYCLINE HYCLATE 100 MG PO TABS: 100.0000 mg | ORAL_TABLET | Freq: Two times a day (BID) | ORAL | 0 refills | Status: DC

## 2020-11-15 NOTE — Telephone Encounter (Signed)
Spoke w/ PCP- informed of chest x-ray- +covid, or viral pneumonia (Pt's covid test was negative on 11/12/20 however was several days into infection (symptoms started 4/17). He instructed me to have Pt start doxycycline 133m bid, #14, mucinex dm, fluid/rest, follow-up w/ PCP when he returns (appt already scheduled for 12/11/20), he is to go to ED if he becomes worse. Labs okay, esr slightly elevated, wbc is good, sodium slightly low but not unexpected, LFTs probably elevated from COVID-19, we are checking an acute hepatitis panel for completeness.    I have sent doxycycline to [facility]. LMOM asking for call back from Pt.

## 2020-11-15 NOTE — Addendum Note (Signed)
Addended by: Kelle Darting A on: 11/15/2020 02:18 PM   Modules accepted: Orders

## 2020-11-15 NOTE — Telephone Encounter (Signed)
Patient called and wanted lab results. Advised that labs are back and that Dr. Larose Kells has not commented on them yet.  He stated that he sees them and advised again that have to wait until Dr. Larose Kells reviews.  We will call him as soon as that is done.

## 2020-11-15 NOTE — Telephone Encounter (Signed)
Spoke w/ Pt- informed of results and recommendations. Pt verbalized understanding. Informed we would let him know about acute hepatitis panel once we receive those results.

## 2020-11-15 NOTE — Telephone Encounter (Addendum)
Attempted to assess patient. Attempted to contact patient for assessment. No answer.

## 2020-11-15 NOTE — Addendum Note (Signed)
Addended by: Kelle Darting A on: 11/15/2020 02:19 PM   Modules accepted: Orders

## 2020-11-16 NOTE — Assessment & Plan Note (Signed)
Profound fatigue, feeling sleepy, myalgias, arthralgias: Symptoms started rather abruptly on April 17. Seen virtually here 11/11/2020, a COVID test was negative, here today for in person evaluation. Exam benign, VS stable, he does not look toxic Epworth scale: 11 PHQ-9: 2 EKG today sinus rhythm. DDx is large but includes PMR, post viral syndrome, tickborne diseases, or others. Doubt OSA given acute onset of symptoms. Plan: CMP, CBC, TSH, sed rate, total CK, Lyme serology, chest x-ray. Further advised with results.  Reassess in 4 weeks. Atrial fibrillation: EKG sinus rhythm, unchanged from previous RTC 4 weeks

## 2020-11-16 NOTE — Telephone Encounter (Signed)
Spoke with the patient, he is feeling about the same. Results reviewed, suspect he had COVID based on history and chest x-ray. Continue doxycycline Start monitoring O2 sats if possible, if consistently less than 94 let us know. Call the practice if questions ER if severe symptoms.  He already has an appointment for a follow-up with me

## 2020-11-18 LAB — HEPATITIS PANEL, ACUTE
Hep A IgM: NONREACTIVE
Hep B C IgM: NONREACTIVE
Hepatitis B Surface Ag: NONREACTIVE
Hepatitis C Ab: NONREACTIVE
SIGNAL TO CUT-OFF: 0.01 (ref <1.00)

## 2020-11-18 LAB — B. BURGDORFI ANTIBODIES: B burgdorferi Ab IgG+IgM: <0.9 index

## 2020-11-22 ENCOUNTER — Encounter: Payer: Self-pay | Admitting: Family Medicine

## 2020-11-22 ENCOUNTER — Telehealth (INDEPENDENT_AMBULATORY_CARE_PROVIDER_SITE_OTHER): Payer: Medicare Other | Admitting: Family Medicine

## 2020-11-22 ENCOUNTER — Other Ambulatory Visit: Payer: Self-pay

## 2020-11-22 VITALS — Temp 99.8°F

## 2020-11-22 DIAGNOSIS — R300 Dysuria: Secondary | ICD-10-CM | POA: Diagnosis not present

## 2020-11-22 DIAGNOSIS — M791 Myalgia, unspecified site: Secondary | ICD-10-CM | POA: Diagnosis not present

## 2020-11-22 DIAGNOSIS — R5383 Other fatigue: Secondary | ICD-10-CM

## 2020-11-22 DIAGNOSIS — R509 Fever, unspecified: Secondary | ICD-10-CM | POA: Diagnosis not present

## 2020-11-22 MED ORDER — SULFAMETHOXAZOLE-TRIMETHOPRIM 800-160 MG PO TABS: 1.0000 | ORAL_TABLET | Freq: Two times a day (BID) | ORAL | 0 refills | Status: AC

## 2020-11-22 NOTE — Progress Notes (Signed)
Chief Complaint  Patient presents with  . Chills  . Fatigue    Exhaustion for 2 weeks. Sleeping 12 to 14 hours a day sometimes. Body aches and joint pain Sweats and chills daily. Loss of appetite     . Dysuria    Sore, painful to touch scalp First temp of the morning 100.2 Waited a few moments and temp went down 99.8     Ival Bible here for f/u. Due to COVID-19 pandemic, we are interacting via web portal for an electronic face-to-face visit. I verified patient's ID using 2 identifiers. Patient agreed to proceed with visit via this method. Patient is at home, I am at home. Patient and I are present for visit.   Duration: 2 weeks  Associated symptoms: low grade fevers, myalgias, dysuria over past few days, decreased appetite, fatigue; he also reports red dots on his back Denies: sinus congestion, sinus pain, rhinorrhea, itchy watery eyes, ear pain, ear drainage, sore throat, wheezing, shortness of breath, myalgia, N/V/D, blood/dc in urine, and coughing Treatment to date: none Workup on 5/5 largely neg.  Sick contacts: No  Tested neg for covid.  He travelled to [place], [place] for a day but no other travel.   Past Medical History:  Diagnosis Date  . Adenomatous colon polyp    TA and SS  . Erectile dysfunction   . Numbness and tingling    right arm   . Paroxysmal A-fib (HCC)    no recurrence s/p atrial flutter ablation (previously atrial flutter felt to degenerate into afib)  . Psoriasis   . TIA (transient ischemic attack) 2004   Objective Temp 99.8 F (37.7 C) (Oral)   SpO2 92%  No conversational dyspnea Age appropriate judgment and insight Nml affect and mood  Dysuria  Fatigue, unspecified type  Myalgia  Low grade fever  He will send me a pic of his back asap. RMSF? 10 d course of Bactrim. Could have been a lingering case of prostatitis.  Continue to push fluids, practice good hand hygiene, cover mouth when coughing. F/u in 2 weeks. If starting to  experience worsening s/s's, seek immediate care. Pt voiced understanding and agreement to the plan.  [place], DO 11/22/20 8:02 AM

## 2020-11-27 NOTE — Telephone Encounter (Signed)
Patient c/o fatigue that continues . Severe fatigue body aches, muscle aches  And HA since 10/29/20. LINQ II results show SR. Has been seen and followed by PCP, and urology. Patient had negative Covid test 11/14/20. . Patient reports his fatigue waxes and wanes, He continues to have HA of less severity. Reports night sweats and chills. Advised to continue to follow-up with PCP for s/sx. Ed precautions given for CP, SOB, chest pressure, dizziness, or syncope. Patient has follow-up with Dr Rayann Heman 12/04/20.

## 2020-11-28 NOTE — Telephone Encounter (Signed)
Pt has f/u on 12/11/20.

## 2020-11-28 NOTE — Telephone Encounter (Signed)
Pt needs f/u with pcp

## 2020-12-02 ENCOUNTER — Ambulatory Visit (INDEPENDENT_AMBULATORY_CARE_PROVIDER_SITE_OTHER): Payer: Medicare Other

## 2020-12-02 DIAGNOSIS — I480 Paroxysmal atrial fibrillation: Secondary | ICD-10-CM

## 2020-12-04 ENCOUNTER — Ambulatory Visit: Payer: Medicare Other | Admitting: Internal Medicine

## 2020-12-04 ENCOUNTER — Other Ambulatory Visit: Payer: Self-pay

## 2020-12-04 ENCOUNTER — Encounter: Payer: Self-pay | Admitting: Internal Medicine

## 2020-12-04 VITALS — BP 116/70 | HR 98 | Ht 72.0 in | Wt 201.8 lb

## 2020-12-04 DIAGNOSIS — I10 Essential (primary) hypertension: Secondary | ICD-10-CM | POA: Diagnosis not present

## 2020-12-04 DIAGNOSIS — I480 Paroxysmal atrial fibrillation: Secondary | ICD-10-CM

## 2020-12-04 NOTE — Patient Instructions (Addendum)
Medication Instructions:  Your physician recommends that you continue on your current medications as directed. Please refer to the Current Medication list given to you today.  Labwork: None ordered.  Testing/Procedures: Your physician has requested that you have an echocardiogram. Echocardiography is a painless test that uses sound waves to create images of your heart. It provides your doctor with information about the size and shape of your heart and how well your heart's chambers and valves are working. This procedure takes approximately one hour. There are no restrictions for this procedure.  Follow-Up: Your physician wants you to follow-up in: 6 months with  the following Advanced Practice Providers on your designated Care Team:      Tommye Standard, PA-C     You will receive a reminder letter in the mail two months in advance. If you don't receive a letter, please call our office to schedule the follow-up appointment.  Any Other Special Instructions Will Be Listed Below (If Applicable).  If you need a refill on your cardiac medications before your next appointment, please call your pharmacy.

## 2020-12-04 NOTE — Progress Notes (Signed)
PCP: Colon Branch, MD   Primary EP: [name] is a 70 y.o. male who presents today for routine electrophysiology followup.  Since April, he has not done well.  + fatigue, frequent sleepiness, diffuse myalgias.  He is following closely with PCP.  The cause has yet to be determined.   Today, he denies symptoms of palpitations, chest pain, shortness of breath,  lower extremity edema, dizziness, presyncope, or syncope.  The patient is otherwise without complaint today.   Past Medical History:  Diagnosis Date  . Adenomatous colon polyp    TA and SS  . Erectile dysfunction   . Numbness and tingling    right arm   . Paroxysmal A-fib (HCC)    no recurrence s/p atrial flutter ablation (previously atrial flutter felt to degenerate into afib)  . Psoriasis   . TIA (transient ischemic attack) 2004   Past Surgical History:  Procedure Laterality Date  . A FLUTTER ABLATION     March [facility] at [facility] by Dr Lyanne Co Dixat  . ATRIAL FIBRILLATION ABLATION N/A 11/07/2019   Procedure: ATRIAL FIBRILLATION ABLATION;  Surgeon: Thompson Grayer, MD;  Location: [place] [facility];  Service: Cardiovascular;  Laterality: N/A;  . implantable loop recorder insertion  09/11/2019   Medtronic Reveal Linq model LNQ 22 (SN [id number]) implantable loop recorder implanted by Dr Rayann Heman for afib management and evaluation of palpitations  . TONSILLECTOMY    . TOTAL HIP ARTHROPLASTY     right    ROS- all systems are reviewed and negatives except as per HPI above  Current Outpatient Medications  Medication Sig Dispense Refill  . aspirin 325 MG tablet aspirin 325 mg tablet  Take 1 tablet every day by oral route.    . diltiazem (CARDIZEM CD) 120 MG 24 hr capsule Take 1 capsule (120 mg total) by mouth daily. 90 capsule 3  . diltiazem (CARDIZEM) 30 MG tablet Take 1 tablet (30 mg total) by mouth 2 (two) times daily as needed (Palpitations). 60 tablet 11  . fluticasone (FLONASE) 50 MCG/ACT nasal  spray Place 2 sprays into both nostrils daily as needed for allergies or rhinitis. 16 g 5   No current facility-administered medications for this visit.    Physical Exam: Vitals:   12/04/20 1518  BP: 116/70  Pulse: 98  SpO2: 92%  Weight: 201 lb 12.8 oz (91.5 kg)  Height: 6' (1.829 m)    GEN- The patient is well appearing, alert and oriented x 3 today.   Head- normocephalic, atraumatic Eyes-  Sclera clear, conjunctiva pink Ears- hearing intact Oropharynx- clear Lungs- Clear to ausculation bilaterally, normal work of breathing Heart- Regular rate and rhythm, no murmurs, rubs or gallops, PMI not laterally displaced GI- soft, NT, ND, + BS Extremities- no clubbing, cyanosis, or edema  Wt Readings from Last 3 Encounters:  12/04/20 201 lb 12.8 oz (91.5 kg)  11/14/20 207 lb 8 oz (94.1 kg)  11/11/20 212 lb (96.2 kg)    EKG tracing ordered today is personally reviewed and shows sinus, RBBB  Assessment and Plan:  1.  Paroxysmal atrial fibrillation Doing well s/p ablation off AAD therapy Per ILR, afib burden is 0% chads2vasc score is 1.  He does not wish to take [facility] but continues to take ASA 325mg  daily for arthritis  2. Tobacco Cessation again advised  3.  Fatigue, SOB Unclear etiology Likely viral by history He is following closely with Dr Larose Kells I will order an echo  to exclude cardiac involvement such as myocarditis, etc.  Return to see EP PA in 6 months  Thompson Grayer MD, [facility] 12/04/2020 3:40 PM

## 2020-12-05 LAB — CUP PACEART REMOTE DEVICE CHECK
Date Time Interrogation Session: 20220513230616
Implantable Pulse Generator Implant Date: 20210301

## 2020-12-11 ENCOUNTER — Encounter: Payer: Self-pay | Admitting: Internal Medicine

## 2020-12-11 ENCOUNTER — Other Ambulatory Visit: Payer: Self-pay

## 2020-12-11 ENCOUNTER — Ambulatory Visit (INDEPENDENT_AMBULATORY_CARE_PROVIDER_SITE_OTHER): Payer: Medicare Other | Admitting: Internal Medicine

## 2020-12-11 ENCOUNTER — Ambulatory Visit (HOSPITAL_BASED_OUTPATIENT_CLINIC_OR_DEPARTMENT_OTHER)
Admission: RE | Admit: 2020-12-11 | Discharge: 2020-12-11 | Disposition: A | Discharge status: Home / Self Care | Payer: Medicare Other | Source: Ambulatory Visit | Attending: Internal Medicine | Admitting: Internal Medicine

## 2020-12-11 VITALS — BP 132/84 | HR 101 | Temp 98.9°F | Resp 16 | Ht 72.0 in | Wt 204.5 lb

## 2020-12-11 DIAGNOSIS — R5383 Other fatigue: Secondary | ICD-10-CM | POA: Diagnosis not present

## 2020-12-11 DIAGNOSIS — B349 Viral infection, unspecified: Secondary | ICD-10-CM | POA: Diagnosis not present

## 2020-12-11 DIAGNOSIS — R399 Unspecified symptoms and signs involving the genitourinary system: Secondary | ICD-10-CM | POA: Diagnosis not present

## 2020-12-11 DIAGNOSIS — U099 Post covid-19 condition, unspecified: Secondary | ICD-10-CM

## 2020-12-11 DIAGNOSIS — J189 Pneumonia, unspecified organism: Secondary | ICD-10-CM | POA: Diagnosis not present

## 2020-12-11 NOTE — Progress Notes (Signed)
Subjective:    Patient ID: Lawrence Rivas, male    DOB: 1951/04/12, 70 y.o.   MRN: [id number]  DOS:  12/11/2020 Type of visit - description: F/U  Was seen earlier this month with acute onset of fatigue, myalgias, arthralgias. Work-up showed increased LFTs with negative hepatitis panel. Chest x-ray showed pneumonia. Working diagnosis is a viral syndrome with pneumonia and  fatigue. Was treated with antibiotics. Subsequently developed dysuria, was seen virtually and treated empirically with Bactrim. Here for follow-up.    Review of Systems Overall continue with profound fatigue. He wakes up and goes to bed feeling tired. He is able to take his 2-mile walk some days but after that, he feels tired for 3 days. No actual chest pain, DOE. No lower extremity edema He has noticed his heart rate -particularly with exertion- is higher than usual. Denies fever chills but he has occasional sweats. No nausea or vomiting, occasional constipation. No cough Had dysuria, currently that is essentially gone.  No hematuria or difficulty urinating.  Past Medical History:  Diagnosis Date  . Adenomatous colon polyp    TA and SS  . Erectile dysfunction   . Numbness and tingling    right arm   . Paroxysmal A-fib (HCC)    no recurrence s/p atrial flutter ablation (previously atrial flutter felt to degenerate into afib)  . Psoriasis   . TIA (transient ischemic attack) 2004    Past Surgical History:  Procedure Laterality Date  . A FLUTTER ABLATION     March 2011 at [facility] by Dr Lyanne Co Dixat  . ATRIAL FIBRILLATION ABLATION N/A 11/07/2019   Procedure: ATRIAL FIBRILLATION ABLATION;  Surgeon: Thompson Grayer, MD;  Location: [place] [facility];  Service: Cardiovascular;  Laterality: N/A;  . implantable loop recorder insertion  09/11/2019   Medtronic Reveal Linq model LNQ 22 (SN [id number]) implantable loop recorder implanted by Dr Rayann Heman for afib management and evaluation of palpitations   . TONSILLECTOMY    . TOTAL HIP ARTHROPLASTY     right    Allergies as of 12/11/2020      Reactions   Watermelon [citrullus Vulgaris] Itching   Any melon   Coconut Oil Hives      Medication List       Accurate as of December 11, 2020 11:59 PM. If you have any questions, ask your nurse or doctor.        aspirin 325 MG tablet aspirin 325 mg tablet  Take 1 tablet every day by oral route.   diltiazem 120 MG 24 hr capsule Commonly known as: CARDIZEM CD Take 1 capsule (120 mg total) by mouth daily.   diltiazem 30 MG tablet Commonly known as: CARDIZEM Take 1 tablet (30 mg total) by mouth 2 (two) times daily as needed (Palpitations).   fluticasone 50 MCG/ACT nasal spray Commonly known as: FLONASE Place 2 sprays into both nostrils daily as needed for allergies or rhinitis.          Objective:   Physical Exam BP 132/84 (BP Location: Left Arm, Patient Position: Sitting, Cuff Size: Normal)   Pulse (!) 101   Temp 98.9 F (37.2 C) (Oral)   Resp 16   Ht 6' (1.829 m)   Wt 204 lb 8 oz (92.8 kg)   SpO2 92%   BMI 27.74 kg/m  General:   Well developed, NAD, BMI noted.  HEENT:  Normocephalic . Face symmetric, atraumatic Lungs:  CTA B Normal respiratory effort, no intercostal retractions, no accessory  muscle use. Heart: RRR,  no murmur.  Abdomen:  Not distended, soft, non-tender. No rebound or rigidity.   Skin: Not pale. Not jaundice Lower extremities: no pretibial edema bilaterally  Neurologic:  alert & oriented X3.  Speech normal, gait appropriate for age and unassisted Psych--  Cognition and judgment appear intact.  Cooperative with normal attention span and concentration.  Behavior appropriate. No anxious or depressed appearing.     Assessment     Assessment P- Atrial fibrillation: Ablation remotely, symptoms resurfaced 2021, loop implantation,  started Xarelto 09-2019, had an ablation April 2021.  Xarelto DC 02-2020 Tobacco abuse Erectile dysfunction TIA 2004     PLAN Profound fatigue, myalgias, arthralgias: See last visit, since then blood work showed increased LFTs, chest x-ray showed PNM, he was treated empirically with antibiotics. Was seen subsequently by one of my partners (virtually), he c/o dysuria, got a round of Bactrim. Saw cardiology 12/04/2020, EKG showed sinus rhythm, RBBB, heart rate 98. At this point, my working diagnosis continue to be a viral syndrome follow-up by pneumonia and profound fatigue.  Myalgias have resolved. His O2 sat is 92% and at home he gets the same readings. Plan: CMP, CBC, iron, ferritin, UA, urine culture, chest x-ray to follow-up on above problems. Refer to pulmonary,  Post viral syndrome?  OSA?  others? I explained all of the above to the patient and his wife, they verbalized understanding. RTC 3 months     This visit occurred during the SARS-CoV-2 public health emergency.  Safety protocols were in place, including screening questions prior to the visit, additional usage of staff PPE, and extensive cleaning of exam room while observing appropriate contact time as indicated for disinfecting solutions.

## 2020-12-11 NOTE — Patient Instructions (Signed)
  We are referring you to the pulmonary doctors. Please call them at [phone]   [facility] : Get the blood work     [place], [place] Come back for  A check up in 3 months     STOP BY THE FIRST FLOOR:  get the XR

## 2020-12-12 LAB — CBC WITH DIFFERENTIAL/PLATELET
Basophils Absolute: 0.1 10*3/uL (ref 0.0–0.1)
Basophils Relative: 1.3 % (ref 0.0–3.0)
Eosinophils Absolute: 0.3 10*3/uL (ref 0.0–0.7)
Eosinophils Relative: 3.8 % (ref 0.0–5.0)
HCT: 35.4 % — ABNORMAL LOW (ref 39.0–52.0)
Hemoglobin: 11.8 g/dL — ABNORMAL LOW (ref 13.0–17.0)
Lymphocytes Relative: 19.1 % (ref 12.0–46.0)
Lymphs Abs: 1.6 10*3/uL (ref 0.7–4.0)
MCHC: 33.5 g/dL (ref 30.0–36.0)
MCV: 96.7 fl (ref 78.0–100.0)
Monocytes Absolute: 1 10*3/uL (ref 0.1–1.0)
Monocytes Relative: 12.5 % — ABNORMAL HIGH (ref 3.0–12.0)
Neutro Abs: 5.3 10*3/uL (ref 1.4–7.7)
Neutrophils Relative %: 63.3 % (ref 43.0–77.0)
Platelets: 390 10*3/uL (ref 150.0–400.0)
RBC: 3.66 Mil/uL — ABNORMAL LOW (ref 4.22–5.81)
RDW: 13 % (ref 11.5–15.5)
WBC: 8.4 10*3/uL (ref 4.0–10.5)

## 2020-12-12 LAB — URINALYSIS, ROUTINE W REFLEX MICROSCOPIC
Bilirubin Urine: NEGATIVE
Hgb urine dipstick: NEGATIVE
Ketones, ur: NEGATIVE
Leukocytes,Ua: NEGATIVE
Nitrite: NEGATIVE
RBC / HPF: NONE SEEN (ref 0–?)
Specific Gravity, Urine: 1.025 (ref 1.000–1.030)
Urine Glucose: NEGATIVE
Urobilinogen, UA: 4 — AB (ref 0.0–1.0)
WBC, UA: NONE SEEN (ref 0–?)
pH: 6 (ref 5.0–8.0)

## 2020-12-12 LAB — URINE CULTURE
MICRO NUMBER:: 11955986
Result:: NO GROWTH
SPECIMEN QUALITY:: ADEQUATE

## 2020-12-12 LAB — FERRITIN: Ferritin: 386.9 ng/mL — ABNORMAL HIGH (ref 22.0–322.0)

## 2020-12-12 NOTE — Assessment & Plan Note (Signed)
Profound fatigue, myalgias, arthralgias: See last visit, since then blood work showed increased LFTs, chest x-ray showed PNM, he was treated empirically with antibiotics. Was seen subsequently by one of my partners (virtually), he c/o dysuria, got a round of Bactrim. Saw cardiology 12/04/2020, EKG showed sinus rhythm, RBBB, heart rate 98. At this point, my working diagnosis continue to be a viral syndrome follow-up by pneumonia and profound fatigue.  Myalgias have resolved. His O2 sat is 92% and at home he gets the same readings. Plan: CMP, CBC, iron, ferritin, UA, urine culture, chest x-ray to follow-up on above problems. Refer to pulmonary,  Post viral syndrome?  OSA?  others? I explained all of the above to the patient and his wife, they verbalized understanding. RTC 3 months

## 2020-12-13 LAB — COMPREHENSIVE METABOLIC PANEL
ALT: 80 U/L — ABNORMAL HIGH (ref 0–53)
AST: 74 U/L — ABNORMAL HIGH (ref 0–37)
Albumin: 3 g/dL — ABNORMAL LOW (ref 3.5–5.2)
Alkaline Phosphatase: 75 U/L (ref 39–117)
BUN: 17 mg/dL (ref 6–23)
CO2: 22 mEq/L (ref 19–32)
Calcium: 8.9 mg/dL (ref 8.4–10.5)
Chloride: 100 mEq/L (ref 96–112)
Creatinine, Ser: 0.68 mg/dL (ref 0.40–1.50)
GFR: 94.1 mL/min (ref >60.00)
Glucose, Bld: 100 mg/dL — ABNORMAL HIGH (ref 70–99)
Potassium: 4.6 mEq/L (ref 3.5–5.1)
Sodium: 136 mEq/L (ref 135–145)
Total Bilirubin: 0.3 mg/dL (ref 0.2–1.2)
Total Protein: 7.5 g/dL (ref 6.0–8.3)

## 2020-12-13 LAB — IRON: Iron: 65 ug/dL (ref 42–165)

## 2020-12-24 NOTE — Progress Notes (Signed)
Carelink Summary Report / Loop Recorder 

## 2020-12-30 ENCOUNTER — Ambulatory Visit (INDEPENDENT_AMBULATORY_CARE_PROVIDER_SITE_OTHER): Payer: Medicare Other

## 2020-12-30 DIAGNOSIS — I480 Paroxysmal atrial fibrillation: Secondary | ICD-10-CM

## 2020-12-31 LAB — CUP PACEART REMOTE DEVICE CHECK
Date Time Interrogation Session: 20220615230314
Implantable Pulse Generator Implant Date: 20210301

## 2021-01-01 ENCOUNTER — Other Ambulatory Visit: Payer: Self-pay

## 2021-01-01 ENCOUNTER — Ambulatory Visit (HOSPITAL_COMMUNITY): Payer: Medicare Other | Attending: Internal Medicine

## 2021-01-01 DIAGNOSIS — I10 Essential (primary) hypertension: Secondary | ICD-10-CM | POA: Diagnosis not present

## 2021-01-01 DIAGNOSIS — I480 Paroxysmal atrial fibrillation: Secondary | ICD-10-CM | POA: Diagnosis not present

## 2021-01-01 LAB — ECHOCARDIOGRAM COMPLETE
Area-P 1/2: 2.81 cm2
S' Lateral: 4 cm

## 2021-01-20 NOTE — Progress Notes (Signed)
Carelink Summary Report / Loop Recorder 

## 2021-01-21 ENCOUNTER — Ambulatory Visit (INDEPENDENT_AMBULATORY_CARE_PROVIDER_SITE_OTHER): Payer: Medicare Other | Admitting: Pulmonary Disease

## 2021-01-21 ENCOUNTER — Encounter: Payer: Self-pay | Admitting: Internal Medicine

## 2021-01-21 ENCOUNTER — Encounter: Payer: Self-pay | Admitting: Pulmonary Disease

## 2021-01-21 ENCOUNTER — Other Ambulatory Visit: Payer: Self-pay

## 2021-01-21 ENCOUNTER — Ambulatory Visit (HOSPITAL_BASED_OUTPATIENT_CLINIC_OR_DEPARTMENT_OTHER)
Admission: RE | Admit: 2021-01-21 | Discharge: 2021-01-21 | Disposition: A | Discharge status: Home / Self Care | Payer: Medicare Other | Source: Ambulatory Visit | Attending: Internal Medicine | Admitting: Internal Medicine

## 2021-01-21 ENCOUNTER — Ambulatory Visit (INDEPENDENT_AMBULATORY_CARE_PROVIDER_SITE_OTHER): Payer: Medicare Other | Admitting: Internal Medicine

## 2021-01-21 VITALS — BP 118/60 | HR 94 | Temp 98.0°F | Resp 16 | Ht 72.0 in | Wt 198.4 lb

## 2021-01-21 VITALS — BP 120/64 | HR 97 | Temp 98.4°F | Ht 72.0 in | Wt 199.0 lb

## 2021-01-21 DIAGNOSIS — M7989 Other specified soft tissue disorders: Secondary | ICD-10-CM | POA: Insufficient documentation

## 2021-01-21 DIAGNOSIS — I480 Paroxysmal atrial fibrillation: Secondary | ICD-10-CM | POA: Diagnosis not present

## 2021-01-21 DIAGNOSIS — R0600 Dyspnea, unspecified: Secondary | ICD-10-CM

## 2021-01-21 DIAGNOSIS — M79661 Pain in right lower leg: Secondary | ICD-10-CM

## 2021-01-21 DIAGNOSIS — I700 Atherosclerosis of aorta: Secondary | ICD-10-CM | POA: Diagnosis not present

## 2021-01-21 DIAGNOSIS — I4891 Unspecified atrial fibrillation: Secondary | ICD-10-CM | POA: Diagnosis not present

## 2021-01-21 DIAGNOSIS — J189 Pneumonia, unspecified organism: Secondary | ICD-10-CM

## 2021-01-21 DIAGNOSIS — R0609 Other forms of dyspnea: Secondary | ICD-10-CM

## 2021-01-21 DIAGNOSIS — M79604 Pain in right leg: Secondary | ICD-10-CM | POA: Diagnosis not present

## 2021-01-21 LAB — CBC WITH DIFFERENTIAL/PLATELET
Basophils Absolute: 0.1 10*3/uL (ref 0.0–0.1)
Basophils Relative: 1.4 % (ref 0.0–3.0)
Eosinophils Absolute: 0.4 10*3/uL (ref 0.0–0.7)
Eosinophils Relative: 4.8 % (ref 0.0–5.0)
HCT: 31.4 % — ABNORMAL LOW (ref 39.0–52.0)
Hemoglobin: 10.7 g/dL — ABNORMAL LOW (ref 13.0–17.0)
Lymphocytes Relative: 31.1 % (ref 12.0–46.0)
Lymphs Abs: 2.4 10*3/uL (ref 0.7–4.0)
MCHC: 34.1 g/dL (ref 30.0–36.0)
MCV: 91.5 fl (ref 78.0–100.0)
Monocytes Absolute: 0.9 10*3/uL (ref 0.1–1.0)
Monocytes Relative: 11.4 % (ref 3.0–12.0)
Neutro Abs: 4 10*3/uL (ref 1.4–7.7)
Neutrophils Relative %: 51.3 % (ref 43.0–77.0)
Platelets: 528 10*3/uL — ABNORMAL HIGH (ref 150.0–400.0)
RBC: 3.43 Mil/uL — ABNORMAL LOW (ref 4.22–5.81)
RDW: 14.6 % (ref 11.5–15.5)
WBC: 7.9 10*3/uL (ref 4.0–10.5)

## 2021-01-21 LAB — COMPREHENSIVE METABOLIC PANEL
ALT: 58 U/L — ABNORMAL HIGH (ref 0–53)
AST: 38 U/L — ABNORMAL HIGH (ref 0–37)
Albumin: 2.8 g/dL — ABNORMAL LOW (ref 3.5–5.2)
Alkaline Phosphatase: 73 U/L (ref 39–117)
BUN: 17 mg/dL (ref 6–23)
CO2: 25 mEq/L (ref 19–32)
Calcium: 8.4 mg/dL (ref 8.4–10.5)
Chloride: 101 mEq/L (ref 96–112)
Creatinine, Ser: 0.66 mg/dL (ref 0.40–1.50)
GFR: 94.88 mL/min (ref >60.00)
Glucose, Bld: 145 mg/dL — ABNORMAL HIGH (ref 70–99)
Potassium: 3.9 mEq/L (ref 3.5–5.1)
Sodium: 134 mEq/L — ABNORMAL LOW (ref 135–145)
Total Bilirubin: 0.2 mg/dL (ref 0.2–1.2)
Total Protein: 7.5 g/dL (ref 6.0–8.3)

## 2021-01-21 LAB — SEDIMENTATION RATE: Sed Rate: 98 mm/hr — ABNORMAL HIGH (ref 0–20)

## 2021-01-21 MED ORDER — PREDNISONE 10 MG PO TABS: ORAL_TABLET | ORAL | 0 refills | Status: AC

## 2021-01-21 MED ORDER — AZITHROMYCIN 500 MG PO TABS: 500.0000 mg | ORAL_TABLET | Freq: Every day | ORAL | 0 refills | Status: DC

## 2021-01-21 NOTE — Progress Notes (Signed)
[facility]  Chief Complaint  Patient presents with   Consult    Patient states that after easter he starting feeling fatigue/endurance and has shortness of breath with exertion even the littlest exertion. Wakes up 2-3 times a night. Does not really have trouble going to sleep/ staying asleep. States that wife states he snores.     Constitutional:  BP 120/64 (BP Location: Left Arm, Patient Position: Sitting, Cuff Size: Normal)   Pulse 97   Temp 98.4 F (36.9 C) (Oral)   Ht 6' (1.829 m)   Wt 199 lb (90.3 kg)   SpO2 94%   BMI 26.99 kg/m   Past Medical History:  Colon polyps, ED, PAF, Psoriasis, TIA 2004, PNA  Past Surgical History:  He  has a past surgical history that includes Total hip arthroplasty; Tonsillectomy; A flutter ablation; implantable loop recorder insertion (09/11/2019); and ATRIAL FIBRILLATION ABLATION (N/A, 11/07/2019).  Brief Summary:  Lawrence Rivas is a 70 y.o. male smoker with dyspnea.      Subjective:   He was fairly active until Easter 2022.  He used to walk 6 to 8 miles per day.  Now he gets winded washing dishes.  He has been feeling fatigued and all his joints hurt.  He has intermittent cough and clear sputum.  He denies sick exposure or recent travel.  He has a International aid/development worker.  Had pneumonia as a child.  No history of TB.  He denies skin rash.  Has noticed swelling in his right leg and doppler u/s today showed ruptured bakers cyst.  He smokes 1/2 ppd and has done this for about 50 years.  He parents were heavy smokers.  He was treated with antibiotics previously.  Hasn't been on prednisone or inhalers.  He worked in [employer].  He is from [place], but has lived in multiple states over the years.  He has been living in his current home for several years.  He is not aware of any mold build up in his home.  He works some in his yard, but doesn't do extensive gardening.  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  He had negative home and in lab COVID 19 testing.  Chest xray today shows left upper lung infiltrate.  Of note, his wife will be having knee replacement surgery in August and he will be the primary care giver during her recovery.  He also has appointment with orthopedics to assess shoulder pains.  Physical Exam:   Appearance - well kempt   ENMT - no sinus tenderness, no oral exudate, no LAN, Mallampati 2 airway, no stridor  Respiratory - inspiratory squeak and wheeze in Lt upper and mid lung fields  CV - s1s2 regular rate and rhythm, no murmurs  Ext - swelling of Rt lower leg  Skin - no rashes  Psych - normal mood and affect   Pulmonary testing:    Chest Imaging:    Cardiac Tests:  Echo 01/01/21 >> EF 50 to 55%, mild LVH  Social History:  He  reports that he has been smoking cigarettes. He started smoking about 50 years ago. He has a 50.00 pack-year smoking history. He has never used smokeless tobacco. He reports current alcohol use. He reports that he does not use drugs.  Family History:  His family history includes Coronary artery disease (age of onset: 65) in his father; Diabetes in his maternal grandmother; Stroke in  his mother.    Discussion:  He has persistent symptoms of cough, dyspnea, fatigue, and arthralgia.  He has waxing and waning pulmonary infiltrates since Easter 2022.  Prior COVID 19 testing has been negative.  He has extensive history of cigarette smoking.    Assessment/Plan:   Recurrent pneumonia with dyspnea. - will have him take zithromax 500 mg daily for 7 days, and prednisone taper over 9 days - will arrange for CMET, CBC with diff, ESR, ANA, RF, ANCA - will arrange for CT chest without contrast - depending on results will determine if he needs to get schedule for bronchoscopy for airway and lung tissue sampling  Tobacco abuse. - reviewed options to assist with smoking cessation - he can try gradually quitting  and nicotine replacement for now, and will reassess smoking cessation efforts at his next visit  Ruptured Baker's cyst in Rt leg. - follow up with PCP  Time Spent Involved in Patient Care on Day of Examination:  48 minutes  Follow up:   Patient Instructions  Lab tests today  Will arrange for CT chest  Zithromax 500 mg daily for 7 days  Prednisone 10 mg pill >> 3 pills daily for 3 days, 2 pills daily for 3 days, 1 pill daily for 3 days  Follow up in 2 weeks with Dr. Halford Chessman or Nurse Practitioner  Medication List:   Allergies as of 01/21/2021       Reactions   Watermelon [citrullus Vulgaris] Itching   Any melon   Coconut Oil Hives        Medication List        Accurate as of January 21, 2021  2:55 PM. If you have any questions, ask your nurse or doctor.          STOP taking these medications    diltiazem 30 MG tablet Commonly known as: CARDIZEM Stopped by: Chesley Mires, MD   fluticasone 50 MCG/ACT nasal spray Commonly known as: FLONASE Stopped by: Kathlene November, MD       TAKE these medications    aspirin 325 MG tablet aspirin 325 mg tablet  Take 1 tablet every day by oral route.   azithromycin 500 MG tablet Commonly known as: Zithromax Take 1 tablet (500 mg total) by mouth daily. Started by: Chesley Mires, MD   diltiazem 120 MG 24 hr capsule Commonly known as: CARDIZEM CD Take 1 capsule (120 mg total) by mouth daily.   predniSONE 10 MG tablet Commonly known as: DELTASONE Take 3 tablets (30 mg total) by mouth daily with breakfast for 3 days, THEN 2 tablets (20 mg total) daily with breakfast for 3 days, THEN 1 tablet (10 mg total) daily with breakfast for 3 days. Start taking on: January 21, 2021 Started by: Chesley Mires, MD        Signature:  Chesley Mires, MD [place] Pager - [phone] 01/21/2021, 2:55 PM

## 2021-01-21 NOTE — Progress Notes (Signed)
Subjective:    Patient ID: Lawrence Rivas, male    DOB: 03-Nov-1950, 70 y.o.   MRN: [id number]  DOS:  01/21/2021 Type of visit - description: Acute visit here with his wife  His main concern is leg swelling. For the last 3 weeks he has noted bilateral leg swelling and feet pain. Symptoms increase after his routine walks but  without DOE or chest pain. Because of the feet pain he started to skip his routine walks.  Last week, the left foot swelling got better but  the R lower extremity got worse: Increased swelling up to the knee Developed a pain up to the calf   Review of Systems No fever chills No chest pain no difficulty breathing. When he takes a walk no DOE unless he walks uphill. No palpitations No back pain No recent prolonged car trip or airplane trip.  Past Medical History:  Diagnosis Date   Adenomatous colon polyp    TA and SS   Erectile dysfunction    Numbness and tingling    right arm    Paroxysmal A-fib (HCC)    no recurrence s/p atrial flutter ablation (previously atrial flutter felt to degenerate into afib)   Psoriasis    TIA (transient ischemic attack) 2004    Past Surgical History:  Procedure Laterality Date   A FLUTTER ABLATION     March 2011 at [facility] by Dr Lyanne Co Dixat   ATRIAL FIBRILLATION ABLATION N/A 11/07/2019   Procedure: [place];  Surgeon: Thompson Grayer, MD;  Location: [place] [facility];  Service: Cardiovascular;  Laterality: N/A;   implantable loop recorder insertion  09/11/2019   Medtronic Reveal Linq model LNQ 22 (SN [id number]) implantable loop recorder implanted by Dr Rayann Heman for afib management and evaluation of palpitations   TONSILLECTOMY     TOTAL HIP ARTHROPLASTY     right    Allergies as of 01/21/2021       Reactions   Watermelon [citrullus Vulgaris] Itching   Any melon   Coconut Oil Hives        Medication List        Accurate as of January 21, 2021 11:27 AM. If you have any  questions, ask your nurse or doctor.          aspirin 325 MG tablet aspirin 325 mg tablet  Take 1 tablet every day by oral route.   diltiazem 120 MG 24 hr capsule Commonly known as: CARDIZEM CD Take 1 capsule (120 mg total) by mouth daily.   diltiazem 30 MG tablet Commonly known as: CARDIZEM Take 1 tablet (30 mg total) by mouth 2 (two) times daily as needed (Palpitations).   fluticasone 50 MCG/ACT nasal spray Commonly known as: FLONASE Place 2 sprays into both nostrils daily as needed for allergies or rhinitis.           Objective:   Physical Exam BP 118/60 (BP Location: Left Arm, Patient Position: Sitting, Cuff Size: Normal)   Pulse 94   Temp 98 F (36.7 C) (Oral)   Resp 16   Ht 6' (1.829 m)   Wt 198 lb 6 oz (90 kg)   SpO2 93%   BMI 26.90 kg/m  General:   Well developed, NAD, BMI noted. HEENT:  Normocephalic . Face symmetric, atraumatic Neck: No JVD Lungs:  Decreased breath sounds left base? Normal respiratory effort, no intercostal retractions, no accessory muscle use. Heart: RRR,  no murmur.  Lower extremities:  Good pedal pulses  bilaterally. R calf  is 1-1/4 inch larger than the left, tender when I squeeze it, tender to palpation of the popliteal area without mass   No warmth, no redness. Skin: Not pale. Not jaundice Neurologic:  alert & oriented X3.  Speech normal, gait appropriate for age and unassisted Psych--  Cognition and judgment appear intact.  Cooperative with normal attention span and concentration.  Behavior appropriate. No anxious or depressed appearing.      Assessment      Assessment P- Atrial fibrillation: Ablation remotely, symptoms resurfaced 2021, loop implantation,  started Xarelto 09-2019, had an ablation April 2021.  Xarelto DC 02-2020 Tobacco abuse Erectile dysfunction TIA 2004    PLAN R leg swelling: As described above, other findings includes question of decreased breath sounds at the left lung bases. EKG today:  Sinus rhythm, RBBB.  At baseline Plan: Chest x-ray, [place] R/O DVT.  Further advised with results. Addendum: - [place] negative for DVT, + rupture Baker's cyst, I spoke with the patient, pain in the knee is not severe, we agreed on leg elevation and call if not better Profound fatigue, myalgias, arthralgias: See previous notes, symptoms started abruptly April 17, he was COVID-negative at the time, chest x-ray showed pneumonia, he was treated. At the last OV here 12/11/2020, labs were okay, chest x-ray showed improved pneumonia, he was referred to pulmonary, visit pending for today. He was seen today @ pulmonary, chest x-ray showed pneumonia again, has been prescribed Zithromax, prednisone, CT chest without and further blood work.    This visit occurred during the SARS-CoV-2 public health emergency.  Safety protocols were in place, including screening questions prior to the visit, additional usage of staff PPE, and extensive cleaning of exam room while observing appropriate contact time as indicated for disinfecting solutions.

## 2021-01-21 NOTE — Patient Instructions (Signed)
Go to the first floor, proceed with a ultrasound of your R leg and chest x-ray  Keep the leg elevated.  Call anytime if chest pain, difficulty breathing or if the swelling gets worse.  Keep your appointment to see the pulmonary doctor today

## 2021-01-21 NOTE — Assessment & Plan Note (Signed)
R leg swelling: As described above, other findings includes question of decreased breath sounds at the left lung bases. EKG today: Sinus rhythm, RBBB.  At baseline Plan: Chest x-ray, [place] R/O DVT.  Further advised with results. Addendum: - [place] negative for DVT, + rupture Baker's cyst, I spoke with the patient, pain in the knee is not severe, we agreed on leg elevation and call if not better Profound fatigue, myalgias, arthralgias: See previous notes, symptoms started abruptly April 17, he was COVID-negative at the time, chest x-ray showed pneumonia, he was treated. At the last OV here 12/11/2020, labs were okay, chest x-ray showed improved pneumonia, he was referred to pulmonary, visit pending for today. He was seen today @ pulmonary, chest x-ray showed pneumonia again, has been prescribed Zithromax, prednisone, CT chest without and further blood work.

## 2021-01-21 NOTE — Patient Instructions (Signed)
Lab tests today  Will arrange for CT chest  Zithromax 500 mg daily for 7 days  Prednisone 10 mg pill >> 3 pills daily for 3 days, 2 pills daily for 3 days, 1 pill daily for 3 days  Follow up in 2 weeks with Dr. Halford Chessman or Nurse Practitioner

## 2021-01-22 DIAGNOSIS — M25512 Pain in left shoulder: Secondary | ICD-10-CM | POA: Diagnosis not present

## 2021-01-22 DIAGNOSIS — M25511 Pain in right shoulder: Secondary | ICD-10-CM | POA: Diagnosis not present

## 2021-01-22 LAB — ANCA SCREEN W REFLEX TITER: ANCA Screen: NEGATIVE

## 2021-01-22 LAB — RHEUMATOID FACTOR: Rheumatoid fact SerPl-aCnc: <14 IU/mL (ref <14)

## 2021-01-30 LAB — CUP PACEART REMOTE DEVICE CHECK
Date Time Interrogation Session: 20220721071018
Implantable Pulse Generator Implant Date: 20210301

## 2021-01-31 DIAGNOSIS — M7672 Peroneal tendinitis, left leg: Secondary | ICD-10-CM | POA: Diagnosis not present

## 2021-01-31 DIAGNOSIS — M7671 Peroneal tendinitis, right leg: Secondary | ICD-10-CM | POA: Diagnosis not present

## 2021-02-01 LAB — ANTINUCLEAR ANTIBODIES, IFA: ANA Titer 1: NEGATIVE

## 2021-02-03 ENCOUNTER — Ambulatory Visit (INDEPENDENT_AMBULATORY_CARE_PROVIDER_SITE_OTHER)
Admission: RE | Admit: 2021-02-03 | Discharge: 2021-02-03 | Disposition: A | Discharge status: Home / Self Care | Payer: Medicare Other | Source: Ambulatory Visit | Attending: Pulmonary Disease | Admitting: Pulmonary Disease

## 2021-02-03 ENCOUNTER — Other Ambulatory Visit: Payer: Self-pay

## 2021-02-03 ENCOUNTER — Ambulatory Visit (INDEPENDENT_AMBULATORY_CARE_PROVIDER_SITE_OTHER): Payer: Medicare Other

## 2021-02-03 DIAGNOSIS — I480 Paroxysmal atrial fibrillation: Secondary | ICD-10-CM | POA: Diagnosis not present

## 2021-02-03 DIAGNOSIS — I700 Atherosclerosis of aorta: Secondary | ICD-10-CM | POA: Diagnosis not present

## 2021-02-03 DIAGNOSIS — J439 Emphysema, unspecified: Secondary | ICD-10-CM | POA: Diagnosis not present

## 2021-02-03 DIAGNOSIS — R0600 Dyspnea, unspecified: Secondary | ICD-10-CM

## 2021-02-03 DIAGNOSIS — J479 Bronchiectasis, uncomplicated: Secondary | ICD-10-CM | POA: Diagnosis not present

## 2021-02-03 DIAGNOSIS — R0609 Other forms of dyspnea: Secondary | ICD-10-CM

## 2021-02-03 DIAGNOSIS — J189 Pneumonia, unspecified organism: Secondary | ICD-10-CM | POA: Diagnosis not present

## 2021-02-05 DIAGNOSIS — M7541 Impingement syndrome of right shoulder: Secondary | ICD-10-CM | POA: Diagnosis not present

## 2021-02-05 DIAGNOSIS — M25511 Pain in right shoulder: Secondary | ICD-10-CM | POA: Diagnosis not present

## 2021-02-12 ENCOUNTER — Encounter: Payer: Self-pay | Admitting: Adult Health

## 2021-02-12 ENCOUNTER — Ambulatory Visit: Payer: Medicare Other | Admitting: Adult Health

## 2021-02-12 ENCOUNTER — Ambulatory Visit: Payer: Medicare Other

## 2021-02-12 ENCOUNTER — Other Ambulatory Visit: Payer: Self-pay

## 2021-02-12 VITALS — BP 124/60 | HR 83 | Temp 98.3°F | Ht 72.0 in | Wt 199.2 lb

## 2021-02-12 DIAGNOSIS — J432 Centrilobular emphysema: Secondary | ICD-10-CM

## 2021-02-12 DIAGNOSIS — R0602 Shortness of breath: Secondary | ICD-10-CM

## 2021-02-12 DIAGNOSIS — J189 Pneumonia, unspecified organism: Secondary | ICD-10-CM | POA: Insufficient documentation

## 2021-02-12 DIAGNOSIS — R9389 Abnormal findings on diagnostic imaging of other specified body structures: Secondary | ICD-10-CM

## 2021-02-12 DIAGNOSIS — L409 Psoriasis, unspecified: Secondary | ICD-10-CM

## 2021-02-12 DIAGNOSIS — J439 Emphysema, unspecified: Secondary | ICD-10-CM | POA: Insufficient documentation

## 2021-02-12 DIAGNOSIS — Z720 Tobacco use: Secondary | ICD-10-CM

## 2021-02-12 LAB — SEDIMENTATION RATE: Sed Rate: 89 mm/hr — ABNORMAL HIGH (ref 0–20)

## 2021-02-12 MED ORDER — STIOLTO RESPIMAT 2.5-2.5 MCG/ACT IN AERS: 2.0000 | INHALATION_SPRAY | Freq: Every day | RESPIRATORY_TRACT | 0 refills | Status: DC

## 2021-02-12 MED ORDER — STIOLTO RESPIMAT 2.5-2.5 MCG/ACT IN AERS: 2.0000 | INHALATION_SPRAY | Freq: Every day | RESPIRATORY_TRACT | 5 refills | Status: DC

## 2021-02-12 NOTE — Assessment & Plan Note (Signed)
Smoking cessation discussed 

## 2021-02-12 NOTE — Assessment & Plan Note (Signed)
Emphysema and active smoker.  Needs PFTs.  Trial of Stiolto.  Plan  Patient Instructions  Begin Stiolto 2 puffs daily  Mucinex DM Twice daily  As needed  cough/congestion  Saline nasal rinses As needed   Lab today .  Work on not smoking.  Follow up with Dermatology regarding rash on legs.  Follow up with Dr. Halford Chessman in 6 week with PFT and chest xray .  Please contact office for sooner follow up if symptoms do not improve or worsen or seek emergency care

## 2021-02-12 NOTE — Assessment & Plan Note (Signed)
Abnormal CT chest-bilateral groundglass opacities, consolidation and bronchiectasis may have a component of some postinflammatory fibrosis.  Patient also has an elevated sed rate questionable etiology.  He does have some clinical improvement after course of antibiotics and steroids. These areas have been ongoing since May questionable etiology.  He does have a history of psoriasis. For now we will check a sed rate.  If remains elevated could consider another round of empiric steroids ? COP   Plan  Patient Instructions  Begin Stiolto 2 puffs daily  Mucinex DM Twice daily  As needed  cough/congestion  Saline nasal rinses As needed   Lab today .  Work on not smoking.  Follow up with Dermatology regarding rash on legs.  Follow up with Dr. Halford Chessman in 6 week with PFT and chest xray .  Please contact office for sooner follow up if symptoms do not improve or worsen or seek emergency care

## 2021-02-12 NOTE — Progress Notes (Signed)
$'@Patient'T$  ID: Lawrence Rivas, male    DOB: 1950/09/13, 70 y.o.   MRN: [id number]  Chief Complaint  Patient presents with   Follow-up    Referring provider: Colon Branch, MD  HPI: 70 year old male active smoker seen for pulmonary consult January 21, 2021 for abnormal chest x-ray with bilateral infiltrates with shortness of breath, fatigue and decreased activity tolerance that started in April 2022 Medical history significant for A. fib, flutter, psoriasis  TEST/EVENTS :  2D echo January 01, 2021 EF 50 to 55%, mild LVH  02/12/2021 Follow up: Dyspnea  Patient returns for a 1 month follow-up.  Patient was seen last visit for a pulmonary consult.  Patient complains that beginning around April 2022 he developed an acute respiratory illness.  He was seen by his primary care provider and given a course of antibiotics.  He had short-term minimal improvement but symptoms continue to persist and have been ongoing since April of this year.  Prior to [place] patient says he was walking and very active he walked up to 6 miles sometimes.  He has complaints of fatigue, diffuse joint pains, intermittent cough congestion, shortness of breath and decreased activity tolerance.  He is following with orthopedics right now they believe he may have a right rotator cuff tear.  He is going for an MRI soon.  During his acute illness in Easter he did test for COVID but was negative.  Patient has a strong smoking history currently smoking 1/2 pack daily.  We discussed smoking cessation.  He is not interested in quitting currently. Initial chest x-ray in early May showed bilateral patchy airspace opacities.  Most severe in the right upper and lower lobe.  Follow-up chest x-ray in June showed some improvement but residual opacities. Chest x-ray July 13 showed worsening multifocal airspace consolidation most severe in the left upper lobe and right lung base. Patient was treated with a 7-day course of azithromycin and a prednisone  taper. Last visit patient was set up for a CT chest showed emphysema, patchy areas of groundglass, consolidation, bronchiectasis.  Borderline enlarged mediastinal lymph nodes. Lab work showed an elevated sed rate at 98. ANA was negative, ANCA was negative and rheumatoid factor was negative.  CBC showed anemia with hemoglobin at 10.7. Since last visit patient says he is feeling some better.  He is not back to baseline but has noticed his cough and congestion have decreased him.  He does have some less shortness of breath but still gets winded with activities still has ongoing joint pains. Patient says he was diagnosed with psoriasis about 3 years ago.  Does have some scattered pruritic spots along the lower extremities unclear if this is psoriasis or not.   Allergies  Allergen Reactions   Watermelon [Citrullus Vulgaris] Itching    Any melon   Coconut Oil Hives    Immunization History  Administered Date(s) Administered   Fluad Quad(high Dose 65+) 04/01/2020   Influenza Split 05/01/2014   Influenza,inj,Quad PF,6+ Mos 06/05/2013   Influenza,inj,quad, With Preservative 04/13/2019   Influenza-Unspecified 05/17/2016   PFIZER(Purple Top)SARS-COV-2 Vaccination 07/29/2019, 08/19/2019, 10/22/2020   Pneumococcal Conjugate-13 04/01/2020   Pneumococcal Polysaccharide-23 06/18/2011   Tdap 06/18/2011   Zoster, Live 06/05/2013    Past Medical History:  Diagnosis Date   Adenomatous colon polyp    TA and SS   Erectile dysfunction    Numbness and tingling    right arm    Paroxysmal A-fib (HCC)    no recurrence s/p atrial flutter  ablation (previously atrial flutter felt to degenerate into afib)   Psoriasis    TIA (transient ischemic attack) 2004    Tobacco History: Social History   Tobacco Use  Smoking Status Every Day   Packs/day: 1.00   Years: 50.00   Pack years: 50.00   Types: Cigarettes   Start date: 15  Smokeless Tobacco Never  Tobacco Comments   1/2 pack a day- 01/21/21 [place]    Ready to quit: No Counseling given: Yes Tobacco comments: 1/2 pack a day- 01/21/21 [place]   Outpatient Medications Prior to Visit  Medication Sig Dispense Refill   aspirin 325 MG tablet aspirin 325 mg tablet  Take 1 tablet every day by oral route.     diltiazem (CARDIZEM CD) 120 MG 24 hr capsule Take 1 capsule (120 mg total) by mouth daily. 90 capsule 3   azithromycin (ZITHROMAX) 500 MG tablet Take 1 tablet (500 mg total) by mouth daily. (Patient not taking: Reported on 02/12/2021) 7 tablet 0   No facility-administered medications prior to visit.     Review of Systems:   Constitutional:   No  weight loss, night sweats,  Fevers, chills,  +fatigue, or  lassitude.  HEENT:   No headaches,  Difficulty swallowing,  Tooth/dental problems, or  Sore throat,                No sneezing, itching, ear ache, nasal congestion, post nasal drip,   CV:  No chest pain,  Orthopnea, PND, swelling in lower extremities, anasarca, dizziness, palpitations, syncope.   GI  No heartburn, indigestion, abdominal pain, nausea, vomiting, diarrhea, change in bowel habits, loss of appetite, bloody stools.   Resp: .  No chest wall deformity  Skin: no rash or lesions.  GU: no dysuria, change in color of urine, no urgency or frequency.  No flank pain, no hematuria   MS:  No joint pain or swelling.  No decreased range of motion.  No back pain.    Physical Exam  BP 124/60 (BP Location: Left Arm, Cuff Size: Normal)   Pulse 83   Temp 98.3 F (36.8 C) (Temporal)   Ht 6' (1.829 m)   Wt 199 lb 3.2 oz (90.4 kg)   SpO2 97%   BMI 27.02 kg/m   GEN: A/Ox3; pleasant , NAD,    HEENT:  [place]/AT,  , NOSE-clear, THROAT-clear, no lesions, no postnasal drip or exudate noted.   NECK:  Supple w/ fair ROM; no JVD; normal carotid impulses w/o bruits; no thyromegaly or nodules palpated; no lymphadenopathy.    RESP  Clear  P & A; w/o, wheezes/ rales/ or rhonchi. no accessory muscle use, no dullness to percussion  CARD:  RRR,  no m/r/g, tr  peripheral edema, pulses intact, no cyanosis or clubbing.  GI:   Soft & nt; nml bowel sounds; no organomegaly or masses detected.   Musco: Warm bil, no deformities or joint swelling noted.   Neuro: alert, no focal deficits noted.    Skin: Warm, scattered white patches along the lower extremities    Lab Results:  CBC    Component Value Date/Time   WBC 7.9 01/21/2021 1447   RBC 3.43 (L) 01/21/2021 1447   HGB 10.7 (L) 01/21/2021 1447   HGB 13.9 10/31/2019 1351   HCT 31.4 (L) 01/21/2021 1447   HCT 40.8 10/31/2019 1351   PLT 528.0 (H) 01/21/2021 1447   PLT 206 10/31/2019 1351   MCV 91.5 01/21/2021 1447   MCV 100 (H) 10/31/2019 1351  MCH 34.0 (H) 10/31/2019 1351   MCHC 34.1 01/21/2021 1447   RDW 14.6 01/21/2021 1447   RDW 13.4 10/31/2019 1351   LYMPHSABS 2.4 01/21/2021 1447   LYMPHSABS 2.1 10/31/2019 1351   MONOABS 0.9 01/21/2021 1447   EOSABS 0.4 01/21/2021 1447   EOSABS 0.1 10/31/2019 1351   BASOSABS 0.1 01/21/2021 1447   BASOSABS 0.0 10/31/2019 1351    BMET    Component Value Date/Time   NA 134 (L) 01/21/2021 1447   NA 134 10/31/2019 1351   K 3.9 01/21/2021 1447   CL 101 01/21/2021 1447   CO2 25 01/21/2021 1447   GLUCOSE 145 (H) 01/21/2021 1447   BUN 17 01/21/2021 1447   BUN 21 10/31/2019 1351   CREATININE 0.66 01/21/2021 1447   CREATININE 0.82 04/01/2020 1531   CALCIUM 8.4 01/21/2021 1447   GFRNONAA 87 10/31/2019 1351   GFRAA 101 10/31/2019 1351    BNP No results found for: BNP  ProBNP No results found for: PROBNP  Imaging: DG Chest 2 View  Result Date: 01/22/2021 CLINICAL DATA:  70 year old male with history of atrial fibrillation. EXAM: CHEST - 2 VIEW COMPARISON:  Chest x-ray 12/11/2020. FINDINGS: There continues to be widespread areas of interstitial prominence scattered throughout the lungs bilaterally. However, compared to the prior examination there is worsening patchy multifocal airspace consolidation, most severe in the periphery  of the left upper lobe and at the right lung base. No pleural effusions. No evidence of pulmonary edema. Heart size is normal. Upper mediastinal contours are within normal limits. Aortic atherosclerosis. Electronic device projecting over the left hemithorax is likely a implantable loop recorder. IMPRESSION: 1. The appearance of the chest suggest worsening multilobar bilateral pneumonia, potentially from atypical infection such as a viral etiology. Further clinical evaluation is recommended. 2. Aortic atherosclerosis. Electronically Signed   By: Vinnie Langton M.D.   On: 01/22/2021 12:32   CT Chest Wo Contrast  Result Date: 02/03/2021 CLINICAL DATA:  Recent pneumonia, finished third round of antibiotics. EXAM: CT CHEST WITHOUT CONTRAST TECHNIQUE: Multidetector CT imaging of the chest was performed following the standard protocol without IV contrast. COMPARISON:  Limited CT chest 11/01/2019. FINDINGS: Cardiovascular: Atherosclerotic calcification of the aorta and coronary arteries. Heart size within normal limits. No pericardial effusion. Mediastinum/Nodes: High right paratracheal lymph node measures 10 mm. Additional mediastinal lymph nodes are subcentimeter in short axis size. Subcarinal lymph node measures 12 mm. Hilar regions are difficult to evaluate without IV contrast. No axillary adenopathy. Esophagus is grossly unremarkable. Lungs/Pleura: Centrilobular emphysema. Patchy areas of pulmonary parenchymal ground-glass, consolidation, bronchiectasis and architectural distortion bilaterally. Findings are new when compared to the visualized portions of the chest on 11/01/2019. No pleural fluid. Debris is seen in the airway. Upper Abdomen: Visualized portions of the liver, gallbladder and right adrenal gland are unremarkable. Slight thickening of the left adrenal gland. Possible subcentimeter low-attenuation lesion off the medial left kidney, incompletely visualized. Visualized portions of the spleen, pancreas,  stomach and bowel are grossly unremarkable. No upper abdominal adenopathy. Musculoskeletal: Degenerative changes in the spine. No worrisome lytic or sclerotic lesions. IMPRESSION: 1. Patchy areas of pulmonary parenchymal ground-glass, consolidation, bronchiectasis and architectural distortion, findings suggestive of the sequelae of COVID-19 pneumonia. 2. Borderline enlarged mediastinal lymph nodes, likely reactive. 3. Aortic atherosclerosis (ICD10-I70.0). Coronary artery calcification. 4.  Emphysema (ICD10-J43.9). Electronically Signed   By: Lorin Picket M.D.   On: 02/03/2021 14:54   US Venous Img Lower Unilateral Right  Result Date: 01/21/2021 CLINICAL DATA:  Right lower extremity  pain and swelling EXAM: RIGHT LOWER EXTREMITY VENOUS DOPPLER ULTRASOUND TECHNIQUE: Gray-scale sonography with compression, as well as color and duplex ultrasound, were performed to evaluate the deep venous system(s) from the level of the common femoral vein through the popliteal and proximal calf veins. COMPARISON:  None. FINDINGS: VENOUS Normal compressibility of the common femoral, superficial femoral, and popliteal veins, as well as the visualized calf veins. Visualized portions of profunda femoral vein and great saphenous vein unremarkable. No filling defects to suggest DVT on grayscale or color Doppler imaging. Doppler waveforms show normal direction of venous flow, normal respiratory plasticity and response to augmentation. Limited views of the contralateral common femoral vein are unremarkable. OTHER Large complex fluid collection in the popliteal fossa extends down into the superior calf. Given the irregular shape of the collection, precise measurements are difficult. The collection measures approximately 6.7 by 5.2 x 2.6 cm. No evidence of internal nodularity or vascularity. Limitations: none IMPRESSION: 1. No evidence of deep venous thrombosis. 2. Large complex fluid collection in the popliteal fossa extending down into  the calf suggests the possibility of a ruptured or hemorrhagic Baker's cyst. Electronically Signed   By: Jacqulynn Cadet M.D.   On: 01/21/2021 12:45   CUP PACEART REMOTE DEVICE CHECK  Result Date: 01/30/2021 ILR summary report received. Battery status OK. Normal device function. No new symptom, tachy, brady, or pause episodes. No new AF episodes. Monthly summary reports and ROV/PRN Kathy Breach, RN, CCDS, [employer]     No flowsheet data found.  No results found for: NITRICOXIDE      Assessment & Plan:   Abnormal CT scan, chest Abnormal CT chest-bilateral groundglass opacities, consolidation and bronchiectasis may have a component of some postinflammatory fibrosis.  Patient also has an elevated sed rate questionable etiology.  He does have some clinical improvement after course of antibiotics and steroids. These areas have been ongoing since May questionable etiology.  He does have a history of psoriasis. For now we will check a sed rate.  If remains elevated could consider another round of empiric steroids ? COP   Plan  Patient Instructions  Begin Stiolto 2 puffs daily  Mucinex DM Twice daily  As needed  cough/congestion  Saline nasal rinses As needed   Lab today .  Work on not smoking.  Follow up with Dermatology regarding rash on legs.  Follow up with Dr. Halford Chessman in 6 week with PFT and chest xray .  Please contact office for sooner follow up if symptoms do not improve or worsen or seek emergency care        Pneumonia Suspected multifocal pneumonia patient has completed a course of antibiotics. Will need follow-up imaging for clearance. He does have some clinical improvement after course of antibiotics.  We will repeat chest x-ray in 4 weeks.  Plan  Patient Instructions  Begin Stiolto 2 puffs daily  Mucinex DM Twice daily  As needed  cough/congestion  Saline nasal rinses As needed   Lab today .  Work on not smoking.  Follow up with Dermatology regarding  rash on legs.  Follow up with Dr. Halford Chessman in 6 week with PFT and chest xray .  Please contact office for sooner follow up if symptoms do not improve or worsen or seek emergency care        Psoriasis Rash along lower extremities questionable etiology.  May be underlying psoriasis.  Patient needs a dermatology follow-up.  Have asked him to follow-up with his dermatologist regarding this.  Once patient is seen dermatology if in fact this is psoriasis along with his ongoing arthralgia may have a component of some psoriatic arthritis.   Tobacco abuse Smoking cessation discussed  Emphysema lung (HCC) Emphysema and active smoker.  Needs PFTs.  Trial of Stiolto.  Plan  Patient Instructions  Begin Stiolto 2 puffs daily  Mucinex DM Twice daily  As needed  cough/congestion  Saline nasal rinses As needed   Lab today .  Work on not smoking.  Follow up with Dermatology regarding rash on legs.  Follow up with Dr. Halford Chessman in 6 week with PFT and chest xray .  Please contact office for sooner follow up if symptoms do not improve or worsen or seek emergency care        I spent   40 minutes dedicated to the care of this patient on the date of this encounter to include pre-visit review of records, face-to-face time with the patient discussing conditions above, post visit ordering of testing, clinical documentation with the electronic health record, making appropriate referrals as documented, and communicating necessary findings to members of the patients care team.    Rexene Edison, NP 02/12/2021

## 2021-02-12 NOTE — Assessment & Plan Note (Signed)
Rash along lower extremities questionable etiology.  May be underlying psoriasis.  Patient needs a dermatology follow-up.  Have asked him to follow-up with his dermatologist regarding this. Once patient is seen dermatology if in fact this is psoriasis along with his ongoing arthralgia may have a component of some psoriatic arthritis.

## 2021-02-12 NOTE — Progress Notes (Signed)
Reviewed and agree with assessment/plan.   Chesley Mires, MD [facility] Pulmonary/Critical Care 02/12/2021, 1:39 PM Pager:  [phone]

## 2021-02-12 NOTE — Assessment & Plan Note (Signed)
Suspected multifocal pneumonia patient has completed a course of antibiotics. Will need follow-up imaging for clearance. He does have some clinical improvement after course of antibiotics.  We will repeat chest x-ray in 4 weeks.  Plan  Patient Instructions  Begin Stiolto 2 puffs daily  Mucinex DM Twice daily  As needed  cough/congestion  Saline nasal rinses As needed   Lab today .  Work on not smoking.  Follow up with Dermatology regarding rash on legs.  Follow up with Dr. Halford Chessman in 6 week with PFT and chest xray .  Please contact office for sooner follow up if symptoms do not improve or worsen or seek emergency care

## 2021-02-12 NOTE — Patient Instructions (Addendum)
Begin Stiolto 2 puffs daily  Mucinex DM Twice daily  As needed  cough/congestion  Saline nasal rinses As needed   Lab today .  Work on not smoking.  Follow up with Dermatology regarding rash on legs.  Follow up with Dr. Halford Chessman in 6 week with PFT and chest xray .  Please contact office for sooner follow up if symptoms do not improve or worsen or seek emergency care

## 2021-02-13 ENCOUNTER — Telehealth: Payer: Self-pay | Admitting: Adult Health

## 2021-02-13 DIAGNOSIS — M25571 Pain in right ankle and joints of right foot: Secondary | ICD-10-CM | POA: Diagnosis not present

## 2021-02-13 DIAGNOSIS — M25572 Pain in left ankle and joints of left foot: Secondary | ICD-10-CM | POA: Diagnosis not present

## 2021-02-13 DIAGNOSIS — L409 Psoriasis, unspecified: Secondary | ICD-10-CM

## 2021-02-13 NOTE — Telephone Encounter (Signed)
Call returned to patient, confirmed DOB. Made aware referral has been placed. Voiced understanding.   Nothing further needed at this time.

## 2021-02-14 ENCOUNTER — Telehealth: Payer: Self-pay | Admitting: Pulmonary Disease

## 2021-02-14 MED ORDER — PREDNISONE 10 MG PO TABS: ORAL_TABLET | ORAL | 0 refills | Status: AC

## 2021-02-14 NOTE — Telephone Encounter (Signed)
   Call returned to patient, confirmed DOB. Made aware of lab results per TP. Voiced understanding. Confirmed pharmacy. Medication sent.   TP patient has an appt 9/21 with PFT and OV. Can we keep this appt or would like him seen sooner. Thanks :)

## 2021-02-14 NOTE — Telephone Encounter (Signed)
See if we can move this up around 1st week of Sept . I can see if Halford Chessman does not have anything

## 2021-02-14 NOTE — Telephone Encounter (Signed)
Call made to patient, confirmed DOB.   [facility] has no slots available for the time frame specified. Appt made with TP 03/19/21 at 10:30. Patient is currently away from his calender, will give [place] a call Monday if this does not work for him.   Nothing further needed at this time.

## 2021-02-17 ENCOUNTER — Other Ambulatory Visit: Payer: Self-pay

## 2021-02-17 ENCOUNTER — Ambulatory Visit (INDEPENDENT_AMBULATORY_CARE_PROVIDER_SITE_OTHER): Payer: Medicare Other | Admitting: Internal Medicine

## 2021-02-17 ENCOUNTER — Encounter: Payer: Self-pay | Admitting: Internal Medicine

## 2021-02-17 VITALS — BP 142/76 | HR 86 | Temp 98.2°F | Resp 16 | Ht 72.0 in | Wt 200.5 lb

## 2021-02-17 DIAGNOSIS — M660 Rupture of popliteal cyst: Secondary | ICD-10-CM | POA: Diagnosis not present

## 2021-02-17 DIAGNOSIS — Z09 Encounter for follow-up examination after completed treatment for conditions other than malignant neoplasm: Secondary | ICD-10-CM

## 2021-02-17 NOTE — Patient Instructions (Signed)
Next visit by November 2022 for a physical exam fasting.

## 2021-02-17 NOTE — Progress Notes (Signed)
Subjective:    Patient ID: Lawrence Rivas, male    DOB: 08-04-1950, 70 y.o.   MRN: [id number]  DOS:  02/17/2021 Type of visit - description: Acute  Patient set up appointment because he continue with R leg swelling on and off. He still have some soreness of the calf and behind the knee. It is his observation that when he takes  prednisone   the swelling is better.  H/o fatigue and pneumonia, chart is extensively reviewed   Review of Systems See above   Past Medical History:  Diagnosis Date   Adenomatous colon polyp    TA and SS   Erectile dysfunction    Numbness and tingling    right arm    Paroxysmal A-fib (HCC)    no recurrence s/p atrial flutter ablation (previously atrial flutter felt to degenerate into afib)   Psoriasis    TIA (transient ischemic attack) 2004    Past Surgical History:  Procedure Laterality Date   A FLUTTER ABLATION     March 2011 at [facility] by Dr Lyanne Co Dixat   ATRIAL FIBRILLATION ABLATION N/A 11/07/2019   Procedure: [place];  Surgeon: Thompson Grayer, MD;  Location: [place] [facility];  Service: Cardiovascular;  Laterality: N/A;   implantable loop recorder insertion  09/11/2019   [employer]onic Reveal Linq model LNQ 22 (SN [id number]) implantable loop recorder implanted by Dr Rayann Heman for afib management and evaluation of palpitations   TONSILLECTOMY     TOTAL HIP ARTHROPLASTY     right    Allergies as of 02/17/2021       Reactions   Watermelon [citrullus Vulgaris] Itching   Any melon   Coconut Oil Hives        Medication List        Accurate as of February 17, 2021 10:06 PM. If you have any questions, ask your nurse or doctor.          aspirin 325 MG tablet aspirin 325 mg tablet  Take 1 tablet every day by oral route.   azithromycin 500 MG tablet Commonly known as: Zithromax Take 1 tablet (500 mg total) by mouth daily.   diltiazem 120 MG 24 hr capsule Commonly known as: CARDIZEM CD Take 1 capsule  (120 mg total) by mouth daily.   predniSONE 10 MG tablet Commonly known as: DELTASONE Take 4 tablets (40 mg total) by mouth daily with breakfast for 7 days, THEN 3 tablets (30 mg total) daily with breakfast for 7 days, THEN 2 tablets (20 mg total) daily with breakfast for 7 days, THEN 1 tablet (10 mg total) daily with breakfast for 7 days, THEN 0.5 tablets (5 mg total) daily with breakfast for 7 days. Start taking on: February 14, 2021   Stiolto Respimat 2.5-2.5 MCG/ACT Aers Generic drug: Tiotropium Bromide-Olodaterol Inhale 2 puffs into the lungs daily.           Objective:   Physical Exam BP (!) 142/76 (BP Location: Left Arm, Patient Position: Sitting, Cuff Size: Small)   Pulse 86   Temp 98.2 F (36.8 C) (Oral)   Resp 16   Ht 6' (1.829 m)   Wt 200 lb 8 oz (90.9 kg)   SpO2 97%   BMI 27.19 kg/m  General:   Well developed, NAD, BMI noted. HEENT:  Normocephalic . Face symmetric, atraumatic Lungs:  CTA B Normal respiratory effort, no intercostal retractions, no accessory muscle use. Heart: RRR,  no murmur.  Lower extremities: R calf  is larger by three quarters of inches. Very mild edema from the ankle distally. Slightly tender at the popliteal area R, no mass. Left leg is normal. Good pedal pulses bilaterally Skin: Not pale. Not jaundice Neurologic:  alert & oriented X3.  Speech normal, gait appropriate for age and unassisted Psych--  Cognition and judgment appear intact.  Cooperative with normal attention span and concentration.  Behavior appropriate. No anxious or depressed appearing.      Assessment       ASSESSMENT P- Atrial fibrillation: Ablation remotely, symptoms resurfaced 2021, loop implantation,  started Xarelto 09-2019, had an ablation April 2021.  Xarelto DC 02-2020 Tobacco abuse Erectile dysfunction TIA 2004   Emphysema per CT 01-2021   PLAN Baker's cyst R: Presented with leg swelling, ultrasound 01/21/2021 showed no clot but a large complex fluid  collection consistent with a Bakers cyst, symptoms are on and off, still have some edema and discomfort at the popliteal area.  Recommend to see Ortho, referral sent. Profound fatigue, myalgias, arthralgias, pneumonia:  Started abruptly April 17, at the time x-ray showed pneumonia. Saw pulmonary 01/21/2021, they noted persistent symptoms, they ordered Zithromax for 7 days, prednisone. ANCA screen negative, rheumatoid factor, ANA negative, sed rate 98. CT chest:  patchy areas of groundglass consolidation and other findings consistent with sequela of COVID-19 pneumonia. Emphysema He was seen again just few days ago, sed rate remain elevated, she was prescribed more prednisone. Next visit: Recommend CPX by November.   This visit occurred during the SARS-CoV-2 public health emergency.  Safety protocols were in place, including screening questions prior to the visit, additional usage of staff PPE, and extensive cleaning of exam room while observing appropriate contact time as indicated for disinfecting solutions.

## 2021-02-17 NOTE — Assessment & Plan Note (Signed)
Baker's cyst R: Presented with leg swelling, ultrasound 01/21/2021 showed no clot but a large complex fluid collection consistent with a Bakers cyst, symptoms are on and off, still have some edema and discomfort at the popliteal area.  Recommend to see Ortho, referral sent. Profound fatigue, myalgias, arthralgias, pneumonia:  Started abruptly April 17, at the time x-ray showed pneumonia. Saw pulmonary 01/21/2021, they noted persistent symptoms, they ordered Zithromax for 7 days, prednisone. ANCA screen negative, rheumatoid factor, ANA negative, sed rate 98. CT chest:  patchy areas of groundglass consolidation and other findings consistent with sequela of COVID-19 pneumonia. Emphysema He was seen again just few days ago, sed rate remain elevated, she was prescribed more prednisone. Next visit: Recommend CPX by November.

## 2021-02-18 NOTE — Progress Notes (Signed)
ATC x1, LVM to return call.

## 2021-02-23 DIAGNOSIS — M25511 Pain in right shoulder: Secondary | ICD-10-CM | POA: Diagnosis not present

## 2021-02-26 DIAGNOSIS — M25511 Pain in right shoulder: Secondary | ICD-10-CM | POA: Diagnosis not present

## 2021-02-27 NOTE — Progress Notes (Signed)
Carelink Summary Report / Loop Recorder 

## 2021-02-28 NOTE — Progress Notes (Signed)
ATC x2, left detailed message (DPR) to return our call regarding his lab results, setting up a follow up appointment and sending in medication to his pharmacy.

## 2021-03-03 ENCOUNTER — Encounter: Payer: Self-pay | Admitting: *Deleted

## 2021-03-03 ENCOUNTER — Telehealth: Payer: Self-pay | Admitting: Adult Health

## 2021-03-03 NOTE — Telephone Encounter (Signed)
Lawrence Needles, NP  02/14/2021  3:48 PM EDT     Labs show that your inflammatory marker remains very high.  Recommend that you begin prednisone taper over next 1 month  Begin prednisone 40 mg daily for 1 week, prednisone 30 mg daily for 1 week, prednisone 20 mg daily for 1 week and 10 mg daily for 1 week- , then '5mg'$  daily for 1 week and stop Office visit in 4 weeks for further evaluation and lab work   Called patient to discuss unable to speak to him.  Left a voicemail to return phone call    Called and spoke with pt letting him know the results of labwork and pt verbalized understanding. Pt said that he has been on the prednisone now x2 weeks and is doing better. Nothing further needed.

## 2021-03-03 NOTE — Progress Notes (Signed)
Unable to reach letter sent

## 2021-03-10 ENCOUNTER — Ambulatory Visit (INDEPENDENT_AMBULATORY_CARE_PROVIDER_SITE_OTHER): Payer: Medicare Other

## 2021-03-10 DIAGNOSIS — I480 Paroxysmal atrial fibrillation: Secondary | ICD-10-CM | POA: Diagnosis not present

## 2021-03-11 LAB — CUP PACEART REMOTE DEVICE CHECK
Date Time Interrogation Session: 20220820230728
Implantable Pulse Generator Implant Date: 20210301

## 2021-03-13 ENCOUNTER — Ambulatory Visit: Payer: Medicare Other | Admitting: Internal Medicine

## 2021-03-14 DIAGNOSIS — M25561 Pain in right knee: Secondary | ICD-10-CM | POA: Diagnosis not present

## 2021-03-19 ENCOUNTER — Other Ambulatory Visit: Payer: Self-pay

## 2021-03-19 ENCOUNTER — Ambulatory Visit (INDEPENDENT_AMBULATORY_CARE_PROVIDER_SITE_OTHER): Payer: Medicare Other | Admitting: Pulmonary Disease

## 2021-03-19 ENCOUNTER — Encounter: Payer: Self-pay | Admitting: Adult Health

## 2021-03-19 ENCOUNTER — Ambulatory Visit: Payer: Medicare Other | Admitting: Adult Health

## 2021-03-19 VITALS — BP 126/70 | HR 78 | Temp 97.9°F | Ht 72.0 in | Wt 202.4 lb

## 2021-03-19 DIAGNOSIS — J189 Pneumonia, unspecified organism: Secondary | ICD-10-CM | POA: Diagnosis not present

## 2021-03-19 DIAGNOSIS — J432 Centrilobular emphysema: Secondary | ICD-10-CM | POA: Diagnosis not present

## 2021-03-19 DIAGNOSIS — R0602 Shortness of breath: Secondary | ICD-10-CM

## 2021-03-19 DIAGNOSIS — Z720 Tobacco use: Secondary | ICD-10-CM

## 2021-03-19 DIAGNOSIS — L409 Psoriasis, unspecified: Secondary | ICD-10-CM | POA: Diagnosis not present

## 2021-03-19 LAB — PULMONARY FUNCTION TEST
DL/VA % pred: 104 %
DL/VA: 4.2 ml/min/mmHg/L
DLCO cor % pred: 93 %
DLCO cor: 25.85 ml/min/mmHg
DLCO unc % pred: 81 %
DLCO unc: 22.47 ml/min/mmHg
FEF 25-75 Post: 2.14 L/sec
FEF 25-75 Pre: 2.39 L/sec
FEF2575-%Change-Post: -10 %
FEF2575-%Pred-Post: 80 %
FEF2575-%Pred-Pre: 89 %
FEV1-%Change-Post: 0 %
FEV1-%Pred-Post: 87 %
FEV1-%Pred-Pre: 88 %
FEV1-Post: 3.06 L
FEV1-Pre: 3.09 L
FEV1FVC-%Change-Post: -1 %
FEV1FVC-%Pred-Pre: 100 %
FEV6-%Change-Post: 1 %
FEV6-%Pred-Post: 91 %
FEV6-%Pred-Pre: 90 %
FEV6-Post: 4.14 L
FEV6-Pre: 4.07 L
FEV6FVC-%Change-Post: 0 %
FEV6FVC-%Pred-Post: 104 %
FEV6FVC-%Pred-Pre: 103 %
FVC-%Change-Post: 0 %
FVC-%Pred-Post: 88 %
FVC-%Pred-Pre: 87 %
FVC-Post: 4.2 L
FVC-Pre: 4.18 L
Post FEV1/FVC ratio: 73 %
Post FEV6/FVC ratio: 99 %
Pre FEV1/FVC ratio: 74 %
Pre FEV6/FVC Ratio: 98 %
RV % pred: 99 %
RV: 2.54 L
TLC % pred: 90 %
TLC: 6.71 L

## 2021-03-19 LAB — SEDIMENTATION RATE: Sed Rate: 40 mm/hr — ABNORMAL HIGH (ref 0–20)

## 2021-03-19 MED ORDER — STIOLTO RESPIMAT 2.5-2.5 MCG/ACT IN AERS: 2.0000 | INHALATION_SPRAY | Freq: Every day | RESPIRATORY_TRACT | 5 refills | Status: DC

## 2021-03-19 NOTE — Patient Instructions (Addendum)
Taper off prednisone this week as planned.  Continue on Stiolto 2 puffs daily  Mucinex DM Twice daily  As needed  cough/congestion  Saline nasal rinses As needed   Lab today .  Work on not smoking.  Refer to rheumatology. -?Psoriatic Arthritis .  Follow up with Dr. Halford Chessman in 2-3 months and As needed   Please contact office for sooner follow up if symptoms do not improve or worsen or seek emergency care

## 2021-03-19 NOTE — Progress Notes (Signed)
$'@Patient'q$  ID: Lawrence Rivas, male    DOB: Mar 13, 1951, 70 y.o.   MRN: [id number]  Chief Complaint  Patient presents with   Follow-up    Referring provider: Colon Branch, MD  HPI: 70 year old male active smoker seen for pulmonary consult January 21, 2021 for abnormal chest x-ray with bilateral infiltrates and shortness of breath, fatigue and decreased activity tolerance that started in April 2022 Medical history significant for A. fib, flutter and Psoriasis.  TEST/EVENTS :  2D echo January 01, 2021 EF 50 to 55%, mild LVH  CT chest showed emphysema, patchy areas of groundglass, consolidation, bronchiectasis.  Borderline enlarged mediastinal lymph nodes. Lab work showed an elevated sed rate at 98. ANA was negative, ANCA was negative and rheumatoid factor was negative.  CBC showed anemia with hemoglobin at 10.7.  03/19/2021 Follow up : Pneumonia , Post inflammatory fibrosis , ? Pneumonitis  Patient returns for a 1 month follow-up.  Patient was seen last visit for ongoing symptoms of cough shortness of breath that began around April 2022.  He was initially seen by primary care and given a course of antibiotics and steroids.  Prior to April patient states he was very active walking up to 6 miles a day at times.  After acute respiratory illness in April he continued to have ongoing symptoms of fatigue, decreased activity tolerance joint pains intermittent cough and congestion and shortness of breath.  COVID-19 testing was negative.  Chest x-ray early May showed bilateral patchy airspace opacity.  Most severe in the right upper and lower lobes.  And chest x-ray in July showed worsening multifocal airspace consolidation.  He was treated with a course of azithromycin and a prednisone taper.  CT chest showed emphysema and bilateral areas of groundglass consolidation.  Sed rate was elevated at 98.  ANA, ANCA and rheumatoid factor were negative.  Did have anemia with hemoglobin at 10.7.  Last visit patient was  started on a slow prednisone taper over the last 4 weeks.  Patient says he has felt substantially improved.  Said that his breathing returned back to normal his activity level improved significantly.  He also had significant decrease in joint pains.  He did notice once he got down to 10 mg of prednisone joint pain started to return some.  He does have a few days left of his prednisone taper.  He is back to activities walking up to 4 miles a day.  He does feel that he does not walk as fast it takes him little bit longer but he is definitely improving his activity tolerance.  Patient says that he has ongoing joint pains in his shoulders knees and feet.  He is following up with orthopedics.  He also has psoriasis.  And has an upcoming appointment with dermatology. Pulmonary function testing done today showed normal lung function with FEV1 at 87%, ratio 73, FVC 88%, no significant bronchodilator response, DLCO 81%.     Allergies  Allergen Reactions   Watermelon [Citrullus Vulgaris] Itching    Any melon   Coconut Oil Hives    Immunization History  Administered Date(s) Administered   Fluad Quad(high Dose 65+) 04/01/2020   Influenza Split 05/01/2014   Influenza,inj,Quad PF,6+ Mos 06/05/2013   Influenza,inj,quad, With Preservative 04/13/2019   Influenza-Unspecified 05/17/2016   PFIZER(Purple Top)SARS-COV-2 Vaccination 07/29/2019, 08/19/2019, 10/22/2020   Pneumococcal Conjugate-13 04/01/2020   Pneumococcal Polysaccharide-23 06/18/2011   Tdap 06/18/2011   Zoster, Live 06/05/2013    Past Medical History:  Diagnosis Date  Adenomatous colon polyp    TA and SS   Erectile dysfunction    Numbness and tingling    right arm    Paroxysmal A-fib (HCC)    no recurrence s/p atrial flutter ablation (previously atrial flutter felt to degenerate into afib)   Psoriasis    TIA (transient ischemic attack) 2004    Tobacco History: Social History   Tobacco Use  Smoking Status Every Day   Packs/day:  1.00   Years: 50.00   Pack years: 50.00   Types: Cigarettes   Start date: 15  Smokeless Tobacco Never  Tobacco Comments   1/2 pack a day- 01/21/21 [place]   Ready to quit: Not Answered Counseling given: Not Answered Tobacco comments: 1/2 pack a day- 01/21/21 [place]   Outpatient Medications Prior to Visit  Medication Sig Dispense Refill   aspirin 325 MG tablet aspirin 325 mg tablet  Take 1 tablet every day by oral route.     azithromycin (ZITHROMAX) 500 MG tablet Take 1 tablet (500 mg total) by mouth daily. (Patient not taking: No sig reported) 7 tablet 0   diltiazem (CARDIZEM CD) 120 MG 24 hr capsule Take 1 capsule (120 mg total) by mouth daily. 90 capsule 3   predniSONE (DELTASONE) 10 MG tablet Take 4 tablets (40 mg total) by mouth daily with breakfast for 7 days, THEN 3 tablets (30 mg total) daily with breakfast for 7 days, THEN 2 tablets (20 mg total) daily with breakfast for 7 days, THEN 1 tablet (10 mg total) daily with breakfast for 7 days, THEN 0.5 tablets (5 mg total) daily with breakfast for 7 days. 73.5 tablet 0   Tiotropium Bromide-Olodaterol (STIOLTO RESPIMAT) 2.5-2.5 MCG/ACT AERS Inhale 2 puffs into the lungs daily. 1 each 0   No facility-administered medications prior to visit.     Review of Systems:   Constitutional:   No  weight loss, night sweats,  Fevers, chills,  +fatigue, or  lassitude.  HEENT:   No headaches,  Difficulty swallowing,  Tooth/dental problems, or  Sore throat,                No sneezing, itching, ear ache, nasal congestion, post nasal drip,   CV:  No chest pain,  Orthopnea, PND, swelling in lower extremities, anasarca, dizziness, palpitations, syncope.   GI  No heartburn, indigestion, abdominal pain, nausea, vomiting, diarrhea, change in bowel habits, loss of appetite, bloody stools.   Resp:  No excess mucus, no productive cough,  No non-productive cough,  No coughing up of blood.  No change in color of mucus.  No wheezing.  No chest wall  deformity  Skin: no rash or lesions.  GU: no dysuria, change in color of urine, no urgency or frequency.  No flank pain, no hematuria   MS:  + joint pain     Physical Exam    GEN: A/Ox3; pleasant , NAD, well nourished    HEENT:  [place]/AT,  EACs-clear, TMs-wnl, NOSE-clear, THROAT-clear, no lesions, no postnasal drip or exudate noted.   NECK:  Supple w/ fair ROM; no JVD; normal carotid impulses w/o bruits; no thyromegaly or nodules palpated; no lymphadenopathy.    RESP  Clear  P & A; w/o, wheezes/ rales/ or rhonchi. no accessory muscle use, no dullness to percussion  CARD:  RRR, no m/r/g, no peripheral edema, pulses intact, no cyanosis or clubbing.  GI:   Soft & nt; nml bowel sounds; no organomegaly or masses detected.   Musco: Warm bil, no  deformities or joint swelling noted.   Neuro: alert, no focal deficits noted.    Skin: Warm, no lesions or rashes    Lab Results:  CBC    Component Value Date/Time   WBC 7.9 01/21/2021 1447   RBC 3.43 (L) 01/21/2021 1447   HGB 10.7 (L) 01/21/2021 1447   HGB 13.9 10/31/2019 1351   HCT 31.4 (L) 01/21/2021 1447   HCT 40.8 10/31/2019 1351   PLT 528.0 (H) 01/21/2021 1447   PLT 206 10/31/2019 1351   MCV 91.5 01/21/2021 1447   MCV 100 (H) 10/31/2019 1351   MCH 34.0 (H) 10/31/2019 1351   MCHC 34.1 01/21/2021 1447   RDW 14.6 01/21/2021 1447   RDW 13.4 10/31/2019 1351   LYMPHSABS 2.4 01/21/2021 1447   LYMPHSABS 2.1 10/31/2019 1351   MONOABS 0.9 01/21/2021 1447   EOSABS 0.4 01/21/2021 1447   EOSABS 0.1 10/31/2019 1351   BASOSABS 0.1 01/21/2021 1447   BASOSABS 0.0 10/31/2019 1351    BMET    Component Value Date/Time   NA 134 (L) 01/21/2021 1447   NA 134 10/31/2019 1351   K 3.9 01/21/2021 1447   CL 101 01/21/2021 1447   CO2 25 01/21/2021 1447   GLUCOSE 145 (H) 01/21/2021 1447   BUN 17 01/21/2021 1447   BUN 21 10/31/2019 1351   CREATININE 0.66 01/21/2021 1447   CREATININE 0.82 04/01/2020 1531   CALCIUM 8.4 01/21/2021 1447    GFRNONAA 87 10/31/2019 1351   GFRAA 101 10/31/2019 1351    BNP No results found for: BNP  ProBNP No results found for: PROBNP  Imaging: CUP PACEART REMOTE DEVICE CHECK  Result Date: 03/11/2021 ILR summary report received. Battery status OK. Normal device function. No new symptom, tachy, brady, or pause episodes.  AF burden 0,1%. Monthly summary reports and ROV/PRN     PFT Results Latest Ref Rng & Units 03/19/2021  FVC-Pre L 4.18  FVC-Predicted Pre % 87  FVC-Post L 4.20  FVC-Predicted Post % 88  Pre FEV1/FVC % % 74  Post FEV1/FCV % % 73  FEV1-Pre L 3.09  FEV1-Predicted Pre % 88  FEV1-Post L 3.06  DLCO uncorrected ml/min/mmHg 22.47  DLCO UNC% % 81  DLCO corrected ml/min/mmHg 25.85  DLCO COR %Predicted % 93  DLVA Predicted % 104  TLC L 6.71  TLC % Predicted % 90  RV % Predicted % 99    No results found for: NITRICOXIDE      Assessment & Plan:   No problem-specific Assessment & Plan notes found for this encounter.     Rexene Edison, NP 03/19/2021

## 2021-03-19 NOTE — Progress Notes (Signed)
PFT done today. 

## 2021-03-21 NOTE — Assessment & Plan Note (Signed)
Pulmonary function testing shows normal lung function with no significant restriction or obstruction.  Patient does have emphysema on CT scan.  We will continue on Stiolto for now.  Encouraged on smoking cessation.  Plan  Patient Instructions  Taper off prednisone this week as planned.  Continue on Stiolto 2 puffs daily  Mucinex DM Twice daily  As needed  cough/congestion  Saline nasal rinses As needed   Lab today .  Work on not smoking.  Refer to rheumatology. -?Psoriatic Arthritis .  Follow up with Dr. Halford Chessman in 2-3 months and As needed   Please contact office for sooner follow up if symptoms do not improve or worsen or seek emergency care

## 2021-03-21 NOTE — Assessment & Plan Note (Signed)
   Discussed smoking cessation. 

## 2021-03-21 NOTE — Assessment & Plan Note (Addendum)
Previous multifocal pneumonia with diffuse groundglass opacities and consolidation on CT scan.  Along with an elevated sed rate.-May have had a component of pneumonitis.  Steroid responsive.  Patient is clinically improved after a prolonged course of steroids.  We will slowly taper off over the next few days. Recheck sed rate today Check chest x-ray today.  Pending these results we will need to determine timing of next CT scan  Plan  Patient Instructions  Taper off prednisone this week as planned.  Continue on Stiolto 2 puffs daily  Mucinex DM Twice daily  As needed  cough/congestion  Saline nasal rinses As needed   Lab today .  Work on not smoking.  Refer to rheumatology. -?Psoriatic Arthritis .  Follow up with Dr. Halford Chessman in 2-3 months and As needed   Please contact office for sooner follow up if symptoms do not improve or worsen or seek emergency care

## 2021-03-21 NOTE — Progress Notes (Signed)
Carelink Summary Report / Loop Recorder 

## 2021-03-21 NOTE — Assessment & Plan Note (Signed)
Underlying psoriasis with significant diffuse joint pain.  Needs referral to rheumatology.

## 2021-03-24 NOTE — Progress Notes (Signed)
ATC x1, LVM to return call.

## 2021-03-25 DIAGNOSIS — M25511 Pain in right shoulder: Secondary | ICD-10-CM | POA: Diagnosis not present

## 2021-03-26 ENCOUNTER — Telehealth: Payer: Self-pay | Admitting: *Deleted

## 2021-03-26 ENCOUNTER — Encounter: Payer: Self-pay | Admitting: *Deleted

## 2021-03-26 NOTE — Progress Notes (Signed)
ATC x2, left vm to return call.  Closing encounter per policy.  Unable to reach letter sent.

## 2021-03-31 ENCOUNTER — Telehealth: Payer: Self-pay | Admitting: Pulmonary Disease

## 2021-03-31 NOTE — Telephone Encounter (Signed)
Melvenia Needles, NP  03/20/2021  2:15 PM EDT     Inflammatory marker is much improved.  Continue with office visit recommendations and finish prednisone as we discussed Please contact office for sooner follow up if symptoms do not improve or worsen or seek emergency care     Called and spoke with the pt and notified of results per Tammy. Pt verbalized understanding and nothing further needed.

## 2021-04-02 ENCOUNTER — Ambulatory Visit: Payer: Medicare Other | Admitting: Pulmonary Disease

## 2021-04-07 ENCOUNTER — Encounter: Payer: Medicare Other | Admitting: Internal Medicine

## 2021-04-09 LAB — CUP PACEART REMOTE DEVICE CHECK
Date Time Interrogation Session: 20220922230641
Implantable Pulse Generator Implant Date: 20210301

## 2021-04-10 ENCOUNTER — Ambulatory Visit (INDEPENDENT_AMBULATORY_CARE_PROVIDER_SITE_OTHER): Payer: Medicare Other

## 2021-04-10 DIAGNOSIS — I480 Paroxysmal atrial fibrillation: Secondary | ICD-10-CM

## 2021-04-15 ENCOUNTER — Other Ambulatory Visit: Payer: Self-pay

## 2021-04-15 ENCOUNTER — Encounter: Payer: Self-pay | Admitting: Family Medicine

## 2021-04-15 ENCOUNTER — Ambulatory Visit (INDEPENDENT_AMBULATORY_CARE_PROVIDER_SITE_OTHER): Payer: Medicare Other | Admitting: Family Medicine

## 2021-04-15 ENCOUNTER — Ambulatory Visit (HOSPITAL_BASED_OUTPATIENT_CLINIC_OR_DEPARTMENT_OTHER)
Admission: RE | Admit: 2021-04-15 | Discharge: 2021-04-15 | Disposition: A | Discharge status: Home / Self Care | Payer: Medicare Other | Source: Ambulatory Visit | Attending: Family Medicine | Admitting: Family Medicine

## 2021-04-15 VITALS — BP 124/78 | HR 82 | Temp 98.4°F | Resp 18 | Ht 72.0 in | Wt 207.0 lb

## 2021-04-15 DIAGNOSIS — M25561 Pain in right knee: Secondary | ICD-10-CM

## 2021-04-15 DIAGNOSIS — G8929 Other chronic pain: Secondary | ICD-10-CM | POA: Diagnosis not present

## 2021-04-15 DIAGNOSIS — M542 Cervicalgia: Secondary | ICD-10-CM | POA: Insufficient documentation

## 2021-04-15 DIAGNOSIS — M25519 Pain in unspecified shoulder: Secondary | ICD-10-CM | POA: Diagnosis not present

## 2021-04-15 DIAGNOSIS — M47812 Spondylosis without myelopathy or radiculopathy, cervical region: Secondary | ICD-10-CM | POA: Diagnosis not present

## 2021-04-15 DIAGNOSIS — M545 Low back pain, unspecified: Secondary | ICD-10-CM | POA: Diagnosis not present

## 2021-04-15 MED ORDER — MELOXICAM 7.5 MG PO TABS: ORAL_TABLET | ORAL | 1 refills | Status: DC

## 2021-04-15 NOTE — Patient Instructions (Signed)
Joint Pain Joint pain may be caused by many things. Joint pain is likely to go away when you follow instructions from your health care provider for relieving pain at home. However, joint pain can also be caused by conditions that require more treatment. Common causes of joint pain include: Bruising in the area of the joint. Injury caused by repeating certain movements too many times. Age-related joint wear and tear. Buildup of uric acid crystals in the joint (gout). Inflammation of the joint. Other forms of arthritis. Infections of the joint or of the bone. Your health care provider may recommend that you take pain medicine or wear a supportive device like an elastic bandage, sling, or splint. If your joint pain continues, you may need lab or imaging tests to diagnose the cause of your joint pain. Follow these instructions at home: Managing pain, stiffness, and swelling   If directed, put ice on the painful area. To do this: If you have a removable elastic bandage, sling, or splint, take it off as told by your doctor. Put ice in a plastic bag. Place a towel between your skin and the bag. Leave the ice on for 20 minutes, 2-3 times a day. Remove the ice if your skin turns bright red. This is very important. If you cannot feel pain, heat, or cold, you have a greater risk of damage to the area. while you are sitting or lying down. If directed, apply heat to the painful area as often as told by your health care provider. Use the heat source that your health care provider recommends, such as a moist heat pack or a heating pad. Place a towel between your skin and the heat source. Leave the heat on for 20-30 minutes. Remove the heat if your skin turns bright red. This is especially important if you are unable to feel pain, heat, or cold. You have a greater risk of getting burned.  Activity Rest as told by your health care provider. Do not do anything that causes or worsens pain. Begin exercising  or stretching the affected area as told by your health care provider. Return to your normal activities as told by your health care provider. Ask your health care provider what activities are safe for you. If you have an elastic bandage, sling, or splint: Wear the bandage, sling, or splint as told by your health care provider. Remove it only as told by your health care provider. Loosen it if your fingers or toes below the joint tingle, become numb, or turn cold and blue. Keep it clean. Ask your health care provider if you should remove it before bathing. If the bandage, sling, or splint is not waterproof: Do not let it get wet. Cover it with a watertight covering when you take a bath or shower. General instructions Treatment may include medicines for pain and inflammation that are taken by mouth or applied to the skin. Take over-the-counter and prescription medicines only as told by your health care provider. Do not use any products that contain nicotine or tobacco, such as cigarettes, e-cigarettes, and chewing tobacco. If you need help quitting, ask your health care provider. Keep all follow-up visits. This is important. Contact a health care provider if: You have pain that gets worse and does not get better with medicine. Your joint pain does not improve within 3 days. You have increased bruising or swelling. You have a fever. You lose 10 lb (4.5 kg) or more without trying. Get help right away if: You  cannot move the joint. Your fingers or toes tingle, become numb, or turn cold and blue. You have a fever along with a joint that is red, warm, and swollen. Summary Joint pain may be caused by many things. Your health care provider may recommend that you take pain medicine or wear a supportive device such as an elastic bandage, sling, or splint. If your joint pain continues, you may need tests to diagnose the cause of your joint pain. Take over-the-counter and prescription medicines only as  told by your health care provider. This information is not intended to replace advice given to you by your health care provider. Make sure you discuss any questions you have with your health care provider. Document Revised: 10/11/2019 Document Reviewed: 10/11/2019 Elsevier Patient Education  2022 [employer].

## 2021-04-15 NOTE — Progress Notes (Signed)
Subjective:   By signing my name below, I, Lawrence Rivas, attest that this documentation has been prepared under the direction and in the presence of Dr. Roma Schanz, DO. 04/15/2021    Patient ID: Lawrence Rivas, male    DOB: 1950/09/29, 70 y.o.   MRN: [id number]  Chief Complaint  Patient presents with   Back Pain    X2 weeks, pt states back, neck, both leg pain, Pt states no falls or injury.    HPI Patient is in today for a office visit.   He complains fatigue, low endurance, bilateral neck pain, back pain, bilateral knee pain, bilateral hip pain for the past 2 weeks. His back pain is localized to his middle lower back. He has prosthetic hip implant. He finds his typical exercise regiment is difficult to complete. He has gained 8 lb's in the past 6 weeks due to decreased exercise. He has a history of arthritis and thinks his pain may be due to that. He denies having any muscle spasms. He is taking 325 mg aspirin PRN around every 3-5 hours daily and reports doing well while taking it. He is also taking Voltaren gel to manage his pain and finds relief. He has an upcoming appointment with a rheumatologist to manage symptoms.  He reports having occasional cramps that cause her fingers to contort. He notes that his cramps typically comes on after cutting with a knife. He denies having any numbness or tingling in the hands.  He reports having pneumonia last easter and was given oral treatments to manage his symptoms. He was then given prednisone and inhalers to manage is infection and shortly recovered.  He continues having spots on his arm that easily bruise since his last prednisone injection. They do not bother him at this time.  He continues having pain in his left shoulder. He has seen an orthopedist specialist and determined he needed an upcoming right shoulder replacement surgery. He also notes that the pain disturbs his sleep at night.    Past Medical History:  Diagnosis Date    Adenomatous colon polyp    TA and SS   Erectile dysfunction    Numbness and tingling    right arm    Paroxysmal A-fib (HCC)    no recurrence s/p atrial flutter ablation (previously atrial flutter felt to degenerate into afib)   Psoriasis    TIA (transient ischemic attack) 2004    Past Surgical History:  Procedure Laterality Date   A FLUTTER ABLATION     March 2011 at [facility] by Dr Lyanne Co Dixat   ATRIAL FIBRILLATION ABLATION N/A 11/07/2019   Procedure: [place];  Surgeon: Thompson Grayer, MD;  Location: [facility];  Service: Cardiovascular;  Laterality: N/A;   implantable loop recorder insertion  09/11/2019   Medtronic Reveal Linq model LNQ 22 (SN [id number]) implantable loop recorder implanted by Dr Rayann Heman for afib management and evaluation of palpitations   TONSILLECTOMY     TOTAL HIP ARTHROPLASTY     right    Family History  Problem Relation Age of Onset   Coronary artery disease Father 62   Stroke Mother        in her 85-90s   Diabetes Maternal Grandmother    Colon cancer Neg Hx    Prostate cancer Neg Hx     Social History   Socioeconomic History   Marital status: Married    Spouse name: Not on file   Number of children: 1  Years of education: Not on file   Highest education level: Not on file  Occupational History   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    Comment: [employer]    Occupation: retired     Fish farm manager: [employer]  Tobacco Use   Smoking status: Every Day    Packs/day: 1.00    Years: 50.00    Pack years: 50.00    Types: Cigarettes    Start date: 21   Smokeless tobacco: Never   Tobacco comments:    1/2 pack a day- 01/21/21 [place]  Vaping Use   Vaping Use: Never used  Substance and Sexual Activity   Alcohol use: Yes    Comment: 2 drinks of scotch per day   Drug use: No   Sexual activity: Not on file  Other Topics Concern   Not on file  Social History Narrative   Household: pt and wife     Social Determinants of  Health   Financial Resource Strain: Not on file  Food Insecurity: Not on file  Transportation Needs: Not on file  Physical Activity: Not on file  Stress: Not on file  Social Connections: Not on file  Intimate Partner Violence: Not on file    Outpatient Medications Prior to Visit  Medication Sig Dispense Refill   aspirin 325 MG tablet aspirin 325 mg tablet  Take 1 tablet every day by oral route.     diltiazem (CARDIZEM CD) 120 MG 24 hr capsule Take 1 capsule (120 mg total) by mouth daily. 90 capsule 3   Tiotropium Bromide-Olodaterol (STIOLTO RESPIMAT) 2.5-2.5 MCG/ACT AERS Inhale 2 puffs into the lungs daily. 1 each 5   azithromycin (ZITHROMAX) 500 MG tablet Take 1 tablet (500 mg total) by mouth daily. (Patient not taking: No sig reported) 7 tablet 0   No facility-administered medications prior to visit.    Allergies  Allergen Reactions   Watermelon [Citrullus Vulgaris] Itching    Any melon   Coconut Oil Hives    Review of Systems  Constitutional:  Positive for malaise/fatigue. Negative for fever.       (+)low endurance  HENT:  Negative for congestion.   Eyes:  Negative for blurred vision.  Respiratory:  Negative for cough and shortness of breath.   Cardiovascular:  Negative for chest pain, palpitations and leg swelling.  Gastrointestinal:  Negative for vomiting.  Musculoskeletal:  Positive for back pain (Lower middle back), joint pain (bilateral knee and hip, Right shoulder) and neck pain (bilateral).  Skin:  Negative for rash.  Neurological:  Negative for loss of consciousness and headaches.  Endo/Heme/Allergies:  Bruises/bleeds easily (bilateral arm).      Objective:    Physical Exam Vitals and nursing note reviewed.  Constitutional:      General: He is not in acute distress.    Appearance: Normal appearance. He is well-developed. He is not ill-appearing.  HENT:     Head: Normocephalic and atraumatic.     Right Ear: External ear normal.     Left Ear: External ear  normal.  Eyes:     Extraocular Movements: Extraocular movements intact.     Pupils: Pupils are equal, round, and reactive to light.  Neck:     Thyroid: No thyromegaly.  Cardiovascular:     Rate and Rhythm: Normal rate and regular rhythm.     Heart sounds: No murmur heard. Pulmonary:     Effort: Pulmonary effort is normal. No respiratory distress.     Breath sounds: Normal breath sounds. No  wheezing or rales.  Chest:     Chest wall: No tenderness.  Musculoskeletal:     Cervical back: Normal range of motion and neck supple.     Right hip: Tenderness present. Normal range of motion. Normal strength.     Left hip: Tenderness present. Normal range of motion. Normal strength.     Right foot: Bony tenderness present. No swelling.     Left foot: Bony tenderness present. No swelling.     Comments: Straight leg test positive  Skin:    General: Skin is warm and dry.  Neurological:     Mental Status: He is alert and oriented to person, place, and time.     Deep Tendon Reflexes:     Reflex Scores:      Patellar reflexes are 1+ on the right side and 1+ on the left side. Psychiatric:        Behavior: Behavior normal.        Thought Content: Thought content normal.        Judgment: Judgment normal.    BP 124/78 (BP Location: Left Arm, Patient Position: Sitting, Cuff Size: Normal)   Pulse 82   Temp 98.4 F (36.9 C) (Oral)   Resp 18   Ht 6' (1.829 m)   Wt 207 lb (93.9 kg)   SpO2 95%   BMI 28.07 kg/m  Wt Readings from Last 3 Encounters:  04/15/21 207 lb (93.9 kg)  03/19/21 202 lb 6.4 oz (91.8 kg)  02/17/21 200 lb 8 oz (90.9 kg)    Diabetic Foot Exam - Simple   No data filed    Lab Results  Component Value Date   WBC 7.9 01/21/2021   HGB 10.7 (L) 01/21/2021   HCT 31.4 (L) 01/21/2021   PLT 528.0 (H) 01/21/2021   GLUCOSE 145 (H) 01/21/2021   CHOL 168 04/01/2020   TRIG 105 04/01/2020   HDL 81 04/01/2020   LDLCALC 68 04/01/2020   ALT 58 (H) 01/21/2021   AST 38 (H) 01/21/2021    NA 134 (L) 01/21/2021   K 3.9 01/21/2021   CL 101 01/21/2021   CREATININE 0.66 01/21/2021   BUN 17 01/21/2021   CO2 25 01/21/2021   TSH 1.67 11/14/2020   PSA 0.19 04/01/2020    Lab Results  Component Value Date   TSH 1.67 11/14/2020   Lab Results  Component Value Date   WBC 7.9 01/21/2021   HGB 10.7 (L) 01/21/2021   HCT 31.4 (L) 01/21/2021   MCV 91.5 01/21/2021   PLT 528.0 (H) 01/21/2021   Lab Results  Component Value Date   NA 134 (L) 01/21/2021   K 3.9 01/21/2021   CO2 25 01/21/2021   GLUCOSE 145 (H) 01/21/2021   BUN 17 01/21/2021   CREATININE 0.66 01/21/2021   BILITOT 0.2 01/21/2021   ALKPHOS 73 01/21/2021   AST 38 (H) 01/21/2021   ALT 58 (H) 01/21/2021   PROT 7.5 01/21/2021   ALBUMIN 2.8 (L) 01/21/2021   CALCIUM 8.4 01/21/2021   GFR 94.88 01/21/2021   Lab Results  Component Value Date   CHOL 168 04/01/2020   Lab Results  Component Value Date   HDL 81 04/01/2020   Lab Results  Component Value Date   LDLCALC 68 04/01/2020   Lab Results  Component Value Date   TRIG 105 04/01/2020   Lab Results  Component Value Date   CHOLHDL 2.1 04/01/2020   No results found for: HGBA1C     Assessment & Plan:  Problem List Items Addressed This Visit       Unprioritized   Chronic midline low back pain without sciatica    mobic per orders  xrays Ice / heat  F/u if no improvement  Seems better with better weather      Relevant Medications   meloxicam (MOBIC) 7.5 MG tablet   Other Relevant Orders   DG Lumbar Spine Complete   Chronic pain of right knee    Pt sees Dr Wynelle Link  He will make an appointment  mobic  Ice/ heat       Relevant Medications   meloxicam (MOBIC) 7.5 MG tablet   Neck pain - Primary    Check xray Symptoms have improved with milder weather       Relevant Medications   meloxicam (MOBIC) 7.5 MG tablet   Other Relevant Orders   DG Cervical Spine Complete     Meds ordered this encounter  Medications   meloxicam  (MOBIC) 7.5 MG tablet    Sig: 1-2 po qd prn    Dispense:  60 tablet    Refill:  1    I, Dr. Roma Schanz, DO, personally preformed the services described in this documentation.  All medical record entries made by the scribe were at my direction and in my presence.  I have reviewed the chart and discharge instructions (if applicable) and agree that the record reflects my personal performance and is accurate and complete. 04/15/2021   I,Lawrence Rivas,acting as a scribe for Ann Held, DO.,have documented all relevant documentation on the behalf of Ann Held, DO,as directed by  Ann Held, DO while in the presence of Ann Held, DO.   Ann Held, DO

## 2021-04-16 DIAGNOSIS — M542 Cervicalgia: Secondary | ICD-10-CM | POA: Insufficient documentation

## 2021-04-16 DIAGNOSIS — G8929 Other chronic pain: Secondary | ICD-10-CM | POA: Insufficient documentation

## 2021-04-16 NOTE — Assessment & Plan Note (Signed)
Check xray Symptoms have improved with milder weather

## 2021-04-16 NOTE — Assessment & Plan Note (Signed)
mobic per orders  xrays Ice / heat  F/u if no improvement  Seems better with better weather

## 2021-04-16 NOTE — Assessment & Plan Note (Signed)
Pt sees Dr Wynelle Link  He will make an appointment  mobic  Ice/ heat

## 2021-04-17 ENCOUNTER — Encounter: Payer: Self-pay | Admitting: Family Medicine

## 2021-04-17 DIAGNOSIS — I700 Atherosclerosis of aorta: Secondary | ICD-10-CM | POA: Insufficient documentation

## 2021-04-18 NOTE — Progress Notes (Signed)
Carelink Summary Report / Loop Recorder 

## 2021-05-01 NOTE — Progress Notes (Signed)
Office Visit Note  Patient: Lawrence Rivas             Date of Birth: 1951/02/19           MRN: [id number]             PCP: Lawrence Branch, MD Referring: Lawrence Needles, NP Visit Date: 05/13/2021 Occupation: '@GUAROCC' @  Subjective:  Pain in multiple joints   History of Present Illness: Lawrence Rivas is a 70 y.o. male seen in consultation per request of Lawrence Serve NP.  According the patient his symptoms with joint pain started many years ago.  He states almost all of his joints have been painful for several years.  The pain has gradually gotten worse.  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  He believes that it was related to a previous fall..  In April 2022 he developed viral pneumonia for which she was initially treated by his PCP and then was referred to [facility]ivas.  He states he had fatigue, fever and chills for almost 1-1/2 weeks.  Lawrence Rivas did extensive work-up and diagnosed him with viral pneumonia and postinflammatory fibrosis and bronchiectasis.  He was given prednisone for about 5 weeks and the symptoms improved.  According to the patient he started walking about 6 to 8 miles per day and felt really well for 3-1/2 weeks and the symptoms gradually recurred with increased fatigue and joint pain.  He states he was having pain in his feet, ankles and plantar fascial.  He also developed right knee joint swelling, lower back pain, gluteal pain and bilateral shoulder joint pain.  He had been experiencing cramps in his hands.  He was evaluated by Lawrence Rivas who did x-rays and MRI of his right shoulder joint.  He also gave him 2 cortisone injections to his right shoulder joint without much relief.  He was told that he had severe arthritis and he will need total shoulder replacement.  He was also seen by Lawrence Rivas at [facility] who diagnosed him with plantar fasciitis and was suspicious of possible psoriasis on his feet.  He  states he was diagnosed with psoriasis about 5 years ago at the time he was seen by dermatologist at [facility] and was given clobetasol.  The psoriasis was on his bilateral lower extremities and cleared up after clobetasol use.    Activities of Daily Living:  Patient reports morning stiffness for all day. Patient Reports nocturnal pain.  Difficulty dressing/grooming: Denies Difficulty climbing stairs: Denies Difficulty getting out of chair: Denies Difficulty using hands for taps, buttons, cutlery, and/or writing: Denies  Review of Systems  Constitutional:  Positive for fatigue. Negative for night sweats.  HENT:  Positive for mouth dryness. Negative for mouth sores and nose dryness.   Eyes:  Negative for pain, redness, itching and dryness.  Respiratory:  Negative for shortness of breath and difficulty breathing.   Cardiovascular:  Negative for chest pain, palpitations, hypertension, irregular heartbeat and swelling in legs/feet.  Gastrointestinal:  Negative for blood in stool, constipation and diarrhea.  Endocrine: Negative for increased urination.  Genitourinary:  Negative for difficulty urinating.  Musculoskeletal:  Positive for joint pain, joint pain, joint swelling, myalgias, morning stiffness and myalgias. Negative for muscle weakness and muscle tenderness.  Skin:  Negative for color change, rash, hair loss, nodules/bumps, redness, skin tightness, ulcers and sensitivity to sunlight.  Allergic/Immunologic: Negative for susceptible to infections.  Neurological:  Negative for dizziness, fainting, numbness, headaches, memory loss, night sweats and weakness.  Hematological:  Positive for bruising/bleeding tendency. Negative for swollen glands.  Psychiatric/Behavioral:  Negative for depressed mood, confusion and sleep disturbance. The patient is not nervous/anxious.    PMFS History:  Patient Active Problem List   Diagnosis Date Noted   Aortic atherosclerosis ([place]) 04/17/2021   Neck pain  04/16/2021   Chronic midline low back pain without sciatica 04/16/2021   Chronic pain of right knee 04/16/2021   Abnormal CT scan, chest 02/12/2021   Pneumonia 02/12/2021   Psoriasis 02/12/2021   Emphysema lung ([place]) 02/12/2021   PCP NOTESS>>>>>>>>>>>>.. 04/02/2020   Secondary hypercoagulable state ([place]) 11/27/2019   Palpitations 09/11/2019   Allergic rhinitis 10/24/2012   Annual physical exam 06/18/2011   Erectile dysfunction 06/18/2011   Atrial fibrillation ([place]) 03/18/2011   Tobacco abuse 03/18/2011    Past Medical History:  Diagnosis Date   Adenomatous Lawrence polyp    TA and SS   Erectile dysfunction    Numbness and tingling    right arm    Paroxysmal A-fib (HCC)    no recurrence s/p atrial flutter ablation (previously atrial flutter felt to degenerate into afib)   Psoriasis    TIA (transient ischemic attack) 2004    Family History  Problem Relation Age of Onset   Stroke Mother        in her 62-90s   Uterine cancer Mother    Coronary artery disease Father 6   Diabetes Maternal Grandmother    Healthy Son    Lawrence cancer Neg Hx    Prostate cancer Neg Hx    Past Surgical History:  Procedure Laterality Date   A FLUTTER ABLATION     March [facility] at [facility] by Lawrence Rivas Dixat   ATRIAL FIBRILLATION ABLATION N/A 11/07/2019   Procedure: ATRIAL FIBRILLATION ABLATION;  Surgeon: Lawrence Grayer, MD;  Location: [facility];  Service: Cardiovascular;  Laterality: N/A;   implantable loop recorder insertion  09/11/2019   Medtronic Reveal Linq model LNQ 22 (SN [id number]) implantable loop recorder implanted by Lawrence Lawrence Rivas for afib management and evaluation of palpitations   TONSILLECTOMY     TOTAL HIP ARTHROPLASTY     right   Social History   Social History Narrative   Household: pt and wife     Immunization History  Administered Date(s) Administered   Fluad Quad(high Dose 65+) 04/01/2020   Influenza Split 05/01/2014   Influenza,inj,Quad PF,6+ Mos 06/05/2013    Influenza,inj,quad, With Preservative 04/13/2019   Influenza-Unspecified 05/17/2016   PFIZER(Purple Top)SARS-COV-2 Vaccination 07/29/2019, 08/19/2019, 10/22/2020   Pneumococcal Conjugate-13 04/01/2020   Pneumococcal Polysaccharide-23 06/18/2011   Tdap 06/18/2011   Zoster, Live 06/05/2013     Objective: Vital Signs: BP (!) 143/78 (BP Location: Right Arm, Patient Position: Sitting, Cuff Size: Normal)   Pulse 81   Ht 6' (1.829 m)   Wt 207 lb 9.6 oz (94.2 kg)   BMI 28.16 kg/m    Physical Exam Vitals and nursing note reviewed.  Constitutional:      Appearance: He is well-developed.  HENT:     Head: Normocephalic and atraumatic.  Eyes:     Conjunctiva/sclera: Conjunctivae normal.     Pupils: Pupils are equal, round, and reactive to light.  Cardiovascular:     Rate and Rhythm: Normal rate and regular rhythm.     Heart sounds: Normal heart sounds.  Pulmonary:     Effort: Pulmonary effort is normal.  Breath sounds: Normal breath sounds.  Abdominal:     General: Bowel sounds are normal.     Palpations: Abdomen is soft.  Musculoskeletal:     Cervical back: Normal range of motion and neck supple.  Skin:    General: Skin is warm and dry.     Capillary Refill: Capillary refill takes less than 2 seconds.     Comments: White plaques noted on bilateral lower extremities and feet.  Neurological:     Mental Status: He is alert and oriented to person, place, and time.  Psychiatric:        Behavior: Behavior normal.     Musculoskeletal Exam: C-spine with good range of motion.  Lumbar spine was in good range of motion with some discomfort in his lower lumbar region.  He had tenderness over SI joints.  He had painful full abduction of his right shoulder joint and discomfort on internal rotation.  He had some discomfort with range of motion of his left shoulder joint.  Elbow joints, wrist joints, MCPs PIPs and DIPs with good range of motion with no synovitis.  No nail pitting was noted.   Hip joints are in good range of motion.  Right hip joint was replaced.  He had warmth and swelling in his right knee joint without any effusion.  Left knee joint was in good range of motion without any warmth or swelling.  There was no tenderness over ankles or MTPs.  There was tenderness over left plantar fascia.  There was no tenderness over Achilles tendon.  He had bilateral pes cavus with dorsal spurs.  He also had callus formation under his MTPs.  CDAI Exam: CDAI Score: -- Patient Global: --; Provider Global: -- Swollen: --; Tender: -- Joint Exam 05/13/2021   No joint exam has been documented for this visit   There is currently no information documented on the homunculus. Go to the Rheumatology activity and complete the homunculus joint exam.  Investigation: No additional findings.  Imaging: DG Cervical Spine Complete  Result Date: 04/16/2021 CLINICAL DATA:  Neck and shoulder pain EXAM: CERVICAL SPINE - COMPLETE 4+ VIEW COMPARISON:  None. FINDINGS: Straightening of the cervical spine. Vertebral body heights are maintained. Mild degenerative changes C4-C5 with moderate severe degenerative changes C5-C6 and C6-C7. Mild foraminal narrowing at multiple levels. Dens and lateral masses are within normal limits. IMPRESSION: Multilevel degenerative changes most advanced at C5-C6 and C6-C7 Electronically Signed   By: Donavan Foil M.D.   On: 04/16/2021 19:38   DG Lumbar Spine Complete  Result Date: 04/16/2021 CLINICAL DATA:  Low back pain EXAM: LUMBAR SPINE - COMPLETE 4+ VIEW COMPARISON:  None. FINDINGS: Five non rib-bearing lumbar type vertebra. Lumbar alignment within normal limits. Vertebral body heights are maintained. Mild degenerative changes at L1-L2 and L2-L3. Facet degenerative changes of the lower lumbar spine. Aortic atherosclerosis IMPRESSION: Mild degenerative changes. Electronically Signed   By: Donavan Foil M.D.   On: 04/16/2021 19:39   CUP PACEART REMOTE DEVICE CHECK  Result  Date: 05/07/2021 ILR summary report received. Battery status OK. Normal device function. No new symptom, tachy, brady, or pause episodes. No new AF episodes. Monthly summary reports and ROV/PRN LR   Recent Labs: Lab Results  Component Value Date   WBC 7.9 01/21/2021   HGB 10.7 (L) 01/21/2021   PLT 528.0 (H) 01/21/2021   NA 134 (L) 01/21/2021   K 3.9 01/21/2021   CL 101 01/21/2021   CO2 25 01/21/2021   GLUCOSE 145 (H)  01/21/2021   BUN 17 01/21/2021   CREATININE 0.66 01/21/2021   BILITOT 0.2 01/21/2021   ALKPHOS 73 01/21/2021   AST 38 (H) 01/21/2021   ALT 58 (H) 01/21/2021   PROT 7.5 01/21/2021   ALBUMIN 2.8 (L) 01/21/2021   CALCIUM 8.4 01/21/2021   GFRAA 101 10/31/2019    Speciality Comments: No specialty comments available.  Procedures:  No procedures performed Allergies: Watermelon [citrullus vulgaris] and Coconut oil   Assessment / Plan:     Visit Diagnoses: Chronic right shoulder pain-patient was evaluated by Lawrence Rivas.  He states he was advised total shoulder replacement due to severe arthritis in his shoulder joint.  He denied a good response to cortisone injection.  He also has some discomfort in his left shoulder.  I do not have x-rays available to review.  Chronic SI joint pain -complains of lower back pain and some discomfort in the SI joints.  Plan: XR Pelvis 1-2 Views.  X-ray of the SI joints did not show any sclerosis or narrowing.  Status post total hip replacement, right-according the patient he had a fall and developed AVN which required total hip replacement in 2006 while he was living in [place].  Chronic pain of right knee -he has been having pain and discomfort in his right knee joint since he had a viral infection and April 2022.  He had warmth and swelling in his right knee joint.  I will obtain following labs today.  Plan: XR KNEE 3 VIEW RIGHT, mild medial compartment narrowing and mild chondromalacia patella was noted.  Sedimentation rate, Cyclic  citrul peptide antibody, IgG, 14-3-3 eta Protein, HLA-B27 antigen, Angiotensin converting enzyme, Uric acid  Pain in both feet -he complains of discomfort in his feet.  No warmth swelling or effusion was noted.  Plan: XR Foot 2 Views Right, XR Foot 2 Views Left.  X-rays were consistent with osteoarthritis.  Bilateral first MTP narrowing and dorsal spurring was noted.  Pes cavus of both feet-he has bilateral pes cavus and callus formation under the MTPs.  He may benefit from orthotics.  Plantar fasciitis of left foot-he had mild tenderness over the plantar fascial.  A handout on plantar fascial exercises was given.  Chronic midline low back pain without sciatica -he complains of ongoing pain and discomfort in his lower back.  X-rays were reviewed which showed mild degenerative changes.  I reviewed the x-rays personally.  Anterior spurring and facet joint arthropathy was noted.  No syndesmophytes were noted.  Other fatigue -he has been experiencing fatigue since he had viral infection in April 2022.  Plan: CBC with Differential/Platelet, COMPLETE METABOLIC PANEL WITH GFR, Glucose 6 phosphate dehydrogenase  Rash-he had white plaques on his right foot which appears to be consistent with molluscum contagiosum.  He has an appointment coming up with the dermatologist.  Psoriasis -patient states that he developed a rash on his lower extremities 5 years ago for which she was seen by dermatologist and [place].  No biopsy was performed.  He was given the diagnosis of psoriasis.  He states after using clobetasol the rash resolved without any recurrence.  He did not have rash in any other body parts.  No psoriasis lesions were noted today.  No nail pitting was noted.  01/21/21: ANCA screen negative, RF<14, ANA negative, ESR 98. 02/12/21: ESR 89. 03/19/21: ESR 40  Paroxysmal atrial fibrillation (HCC) - s/p ablation, with no recurrence.  He was treated with anticoagulation in the past.  Aortic atherosclerosis  ([place])  Bronchiectasis without complication (HCC)-patient developed viral pneumonia in April 2022.  He was seen by Lawrence Rivas.  He was also given prednisone taper for about 5 weeks which helped his pulmonary symptoms.  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  He was also given the diagnosis of bronchiectasis.  He will have follow-up appointment with Lawrence Rivas.  Centrilobular emphysema (HCC)  Tobacco abuse - 1/2 PPD X 50 years  Orders: Orders Placed This Encounter  Procedures   XR Pelvis 1-2 Views   XR KNEE 3 VIEW RIGHT   XR Foot 2 Views Right   XR Foot 2 Views Left   CBC with Differential/Platelet   COMPLETE METABOLIC PANEL WITH GFR   Sedimentation rate   Cyclic citrul peptide antibody, IgG   HLA-B27 antigen   Angiotensin converting enzyme   Glucose 6 phosphate dehydrogenase   Uric acid    No orders of the defined types were placed in this encounter.    Follow-Up Instructions: Return for Pain in multiple joints.   Bo Merino, MD  Note - This record has been created using Editor, commissioning.  Chart creation errors have been sought, but may not always  have been located. Such creation errors do not reflect on  the standard of medical care.,

## 2021-05-07 LAB — CUP PACEART REMOTE DEVICE CHECK
Date Time Interrogation Session: 20221025230413
Implantable Pulse Generator Implant Date: 20210301

## 2021-05-13 ENCOUNTER — Ambulatory Visit: Payer: Self-pay

## 2021-05-13 ENCOUNTER — Encounter: Payer: Self-pay | Admitting: Rheumatology

## 2021-05-13 ENCOUNTER — Ambulatory Visit: Payer: Medicare Other | Admitting: Rheumatology

## 2021-05-13 ENCOUNTER — Other Ambulatory Visit: Payer: Self-pay

## 2021-05-13 ENCOUNTER — Ambulatory Visit (INDEPENDENT_AMBULATORY_CARE_PROVIDER_SITE_OTHER): Payer: Medicare Other

## 2021-05-13 VITALS — BP 143/78 | HR 81 | Ht 72.0 in | Wt 207.6 lb

## 2021-05-13 DIAGNOSIS — M533 Sacrococcygeal disorders, not elsewhere classified: Secondary | ICD-10-CM

## 2021-05-13 DIAGNOSIS — I700 Atherosclerosis of aorta: Secondary | ICD-10-CM

## 2021-05-13 DIAGNOSIS — I4891 Unspecified atrial fibrillation: Secondary | ICD-10-CM | POA: Diagnosis not present

## 2021-05-13 DIAGNOSIS — J432 Centrilobular emphysema: Secondary | ICD-10-CM

## 2021-05-13 DIAGNOSIS — M79671 Pain in right foot: Secondary | ICD-10-CM | POA: Diagnosis not present

## 2021-05-13 DIAGNOSIS — R5383 Other fatigue: Secondary | ICD-10-CM | POA: Diagnosis not present

## 2021-05-13 DIAGNOSIS — Z96641 Presence of right artificial hip joint: Secondary | ICD-10-CM | POA: Diagnosis not present

## 2021-05-13 DIAGNOSIS — Q6672 Congenital pes cavus, left foot: Secondary | ICD-10-CM

## 2021-05-13 DIAGNOSIS — G8929 Other chronic pain: Secondary | ICD-10-CM | POA: Diagnosis not present

## 2021-05-13 DIAGNOSIS — M79672 Pain in left foot: Secondary | ICD-10-CM | POA: Diagnosis not present

## 2021-05-13 DIAGNOSIS — L409 Psoriasis, unspecified: Secondary | ICD-10-CM

## 2021-05-13 DIAGNOSIS — M25511 Pain in right shoulder: Secondary | ICD-10-CM

## 2021-05-13 DIAGNOSIS — Q6671 Congenital pes cavus, right foot: Secondary | ICD-10-CM

## 2021-05-13 DIAGNOSIS — D6869 Other thrombophilia: Secondary | ICD-10-CM

## 2021-05-13 DIAGNOSIS — I480 Paroxysmal atrial fibrillation: Secondary | ICD-10-CM

## 2021-05-13 DIAGNOSIS — J479 Bronchiectasis, uncomplicated: Secondary | ICD-10-CM

## 2021-05-13 DIAGNOSIS — M25561 Pain in right knee: Secondary | ICD-10-CM

## 2021-05-13 DIAGNOSIS — R21 Rash and other nonspecific skin eruption: Secondary | ICD-10-CM

## 2021-05-13 DIAGNOSIS — M545 Low back pain, unspecified: Secondary | ICD-10-CM

## 2021-05-13 DIAGNOSIS — Z720 Tobacco use: Secondary | ICD-10-CM

## 2021-05-13 DIAGNOSIS — R002 Palpitations: Secondary | ICD-10-CM

## 2021-05-13 DIAGNOSIS — M722 Plantar fascial fibromatosis: Secondary | ICD-10-CM

## 2021-05-13 NOTE — Patient Instructions (Signed)
Plantar Fasciitis Rehab Ask your health care provider which exercises are safe for you. Do exercises exactly as told by your health care provider and adjust them as directed. It is normal to feel mild stretching, pulling, tightness, or discomfort as you do these exercises. Stop right away if you feel sudden pain or your pain gets worse. Do not begin these exercises until told by your health care provider. Stretching and range-of-motion exercises These exercises warm up your muscles and joints and improve the movement and flexibility of your foot. These exercises also help to relieve pain. Plantar fascia stretch  Sit with your left / right leg crossed over your opposite knee. Hold your heel with one hand with that thumb near your arch. With your other hand, hold your toes and gently pull them back toward the top of your foot. You should feel a stretch on the base (bottom) of your toes, or the bottom of your foot (plantar fascia), or both. Hold this stretch for__________ seconds. Slowly release your toes and return to the starting position. Repeat __________ times. Complete this exercise __________ times a day. Gastrocnemius stretch, standing This exercise is also called a calf (gastroc) stretch. It stretches the muscles in the back of the upper calf. Stand with your hands against a wall. Extend your left / right leg behind you, and bend your front knee slightly. Keeping your heels on the floor, your toes facing forward, and your back knee straight, shift your weight toward the wall. Do not arch your back. You should feel a gentle stretch in your upper calf. Hold this position for __________ seconds. Repeat __________ times. Complete this exercise __________ times a day. Soleus stretch, standing This exercise is also called a calf (soleus) stretch. It stretches the muscles in the back of the lower calf. Stand with your hands against a wall. Extend your left / right leg behind you, and bend your  front knee slightly. Keeping your heels on the floor and your toes facing forward, bend your back knee and shift your weight slightly over your back leg. You should feel a gentle stretch deep in your lower calf. Hold this position for __________ seconds. Repeat __________ times. Complete this exercise __________ times a day. Gastroc and soleus stretch, standing step This exercise stretches the muscles in the back of the lower leg. These muscles are in the upper calf (gastrocnemius) and the lower calf (soleus). Stand with the ball of your left / right foot on the front of a step. The ball of your foot is on the walking surface, right under your toes. Keep your other foot firmly on the same step. Hold on to the wall or a railing for balance. Slowly lift your other foot, allowing your body weight to press your heel down over the edge of the front of the step. Keep knee straight and unbent. You should feel a stretch in your calf. Hold this position for __________ seconds. Return both feet to the step. Repeat this exercise with a slight bend in your left / right knee. Repeat __________ times with your left / right knee straight and __________ times with your left / right knee bent. Complete this exercise __________ times a day. Balance exercise This exercise builds your balance and strength control of your arch to help take pressure off your plantar fascia. Single leg stand If this exercise is too easy, you can try it with your eyes closed or while standing on a pillow. Without shoes, stand near a   railing or in a doorway. You may hold on to the railing or door frame as needed. Stand on your left / right foot. Keep your big toe down on the floor and lift the arch of your foot. You should feel a stretch across the bottom of your foot and your arch. Do not let your foot roll inward. Hold this position for __________ seconds. Repeat __________ times. Complete this exercise __________ times a day. This  information is not intended to replace advice given to you by your health care provider. Make sure you discuss any questions you have with your health care provider. Document Revised: 04/11/2020 Document Reviewed: 04/11/2020 Elsevier Patient Education  [place].

## 2021-05-18 LAB — COMPLETE METABOLIC PANEL WITH GFR
AG Ratio: 0.9 (calc) — ABNORMAL LOW (ref 1.0–2.5)
ALT: 17 U/L (ref 9–46)
AST: 20 U/L (ref 10–35)
Albumin: 3.4 g/dL — ABNORMAL LOW (ref 3.6–5.1)
Alkaline phosphatase (APISO): 65 U/L (ref 35–144)
BUN/Creatinine Ratio: 21 (calc) (ref 6–22)
BUN: 14 mg/dL (ref 7–25)
CO2: 29 mmol/L (ref 20–32)
Calcium: 9.1 mg/dL (ref 8.6–10.3)
Chloride: 100 mmol/L (ref 98–110)
Creat: 0.66 mg/dL — ABNORMAL LOW (ref 0.70–1.28)
Globulin: 3.6 g/dL (calc) (ref 1.9–3.7)
Glucose, Bld: 93 mg/dL (ref 65–99)
Potassium: 4.6 mmol/L (ref 3.5–5.3)
Sodium: 138 mmol/L (ref 135–146)
Total Bilirubin: 0.4 mg/dL (ref 0.2–1.2)
Total Protein: 7 g/dL (ref 6.1–8.1)
eGFR: 100 mL/min/{1.73_m2} (ref >=60)

## 2021-05-18 LAB — HLA-B27 ANTIGEN: HLA-B27 Antigen: NEGATIVE

## 2021-05-18 LAB — CBC WITH DIFFERENTIAL/PLATELET
Absolute Monocytes: 889 cells/uL (ref 200–950)
Basophils Absolute: 61 cells/uL (ref 0–200)
Basophils Relative: 0.8 %
Eosinophils Absolute: 129 cells/uL (ref 15–500)
Eosinophils Relative: 1.7 %
HCT: 40.8 % (ref 38.5–50.0)
Hemoglobin: 13.8 g/dL (ref 13.2–17.1)
Lymphs Abs: 2082 cells/uL (ref 850–3900)
MCH: 32.7 pg (ref 27.0–33.0)
MCHC: 33.8 g/dL (ref 32.0–36.0)
MCV: 96.7 fL (ref 80.0–100.0)
MPV: 11.4 fL (ref 7.5–12.5)
Monocytes Relative: 11.7 %
Neutro Abs: 4438 cells/uL (ref 1500–7800)
Neutrophils Relative %: 58.4 %
Platelets: 354 10*3/uL (ref 140–400)
RBC: 4.22 10*6/uL (ref 4.20–5.80)
RDW: 14 % (ref 11.0–15.0)
Total Lymphocyte: 27.4 %
WBC: 7.6 10*3/uL (ref 3.8–10.8)

## 2021-05-18 LAB — URIC ACID: Uric Acid, Serum: 4.4 mg/dL (ref 4.0–8.0)

## 2021-05-18 LAB — GLUCOSE 6 PHOSPHATE DEHYDROGENASE: G-6PDH: 12 U/g Hgb (ref 7.0–20.5)

## 2021-05-18 LAB — CYCLIC CITRUL PEPTIDE ANTIBODY, IGG: Cyclic Citrullin Peptide Ab: <16 UNITS

## 2021-05-18 LAB — ANGIOTENSIN CONVERTING ENZYME: Angiotensin-Converting Enzyme: 62 U/L (ref 9–67)

## 2021-05-18 LAB — SEDIMENTATION RATE: Sed Rate: 67 mm/h — ABNORMAL HIGH (ref 0–20)

## 2021-05-18 NOTE — Progress Notes (Signed)
Sedimentation rate is elevated which indicates inflammation.  All other labs were unremarkable.  I will discuss results at the follow-up visit.

## 2021-05-19 ENCOUNTER — Other Ambulatory Visit: Payer: Self-pay

## 2021-05-19 ENCOUNTER — Encounter: Payer: Self-pay | Admitting: Pulmonary Disease

## 2021-05-19 ENCOUNTER — Ambulatory Visit: Payer: Medicare Other | Admitting: Pulmonary Disease

## 2021-05-19 VITALS — BP 110/80 | HR 99 | Temp 98.7°F | Ht 72.0 in | Wt 204.8 lb

## 2021-05-19 DIAGNOSIS — J432 Centrilobular emphysema: Secondary | ICD-10-CM | POA: Diagnosis not present

## 2021-05-19 DIAGNOSIS — J849 Interstitial pulmonary disease, unspecified: Secondary | ICD-10-CM | POA: Diagnosis not present

## 2021-05-19 DIAGNOSIS — Z720 Tobacco use: Secondary | ICD-10-CM | POA: Diagnosis not present

## 2021-05-19 NOTE — Progress Notes (Signed)
[name]  Chief Complaint  Patient presents with   Follow-up    SOB, long term post viral pneumonia    Past Surgical History:  He  has a past surgical history that includes Total hip arthroplasty; Tonsillectomy; A flutter ablation; implantable loop recorder insertion (09/11/2019); and ATRIAL FIBRILLATION ABLATION (N/A, 11/07/2019).  Past Medical History:  Colon polyps, ED, PAF, Psoriasis, TIA 2004, PNA  Constitutional:  BP 110/80 (BP Location: Left Arm, Cuff Size: Normal)   Pulse 99   Temp 98.7 F (37.1 C) (Oral)   Ht 6' (1.829 m)   Wt 204 lb 12.8 oz (92.9 kg)   SpO2 97%   BMI 27.78 kg/m   Brief Summary:  Lawrence Rivas is a 70 y.o. male smoker with dyspnea from steroid responsive pneumonitis with symptoms starting after viral respiratory infection in April 2022.      Subjective:   Since his last visit he had CT chest and PFT.  CT chest showed emphysema and changes of post viral pneumonitis.  PFT was normal.  He has been using stiolto.  Not sure if it is helping, but doesn't want to try stopping yet.  He is not having cough, wheeze, or sputum.  He still is smoking.  This helps alleviate stress.  He has lots of stress dealing with family issues.  His activity level is limited by joint pains.  He has follow up with rheumatology later this month.  Physical Exam:   Appearance - well kempt   ENMT - no sinus tenderness, no oral exudate, no LAN, Mallampati 2 airway, no stridor  Respiratory - equal breath sounds bilaterally, no wheezing or rales  CV - s1s2 regular rate and rhythm, no murmurs  Ext - no clubbing, no edema  Skin - no rashes  Psych - normal mood and affect    Pulmonary testing:  Serology 01/21/21 >> ANA, RF, ANCA negative; ESR 98 PFT 03/19/21 >> FEV1 3.09 (88%), FEV1% 74, TLC 6.71 (90%), DLCO 81%  Chest Imaging:  CT chest 02/03/21 >> atherosclerosis; centrilobular emphysema; patchy areas of GGO,  consolidation an BTX (suggestive of post COVID sequelae)  Cardiac Tests:  Echo 01/01/21 >> EF 50 to 55%, mild LVH  Social History:  He  reports that he has been smoking cigarettes. He started smoking about 50 years ago. He has a 25.00 pack-year smoking history. He has never used smokeless tobacco. He reports current alcohol use. He reports that he does not use drugs.  Family History:  His family history includes Coronary artery disease (age of onset: 66) in his father; Diabetes in his maternal grandmother; Healthy in his son; Stroke in his mother; Uterine cancer in his mother.     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 - repeat high resolution CT chest in February 2023  Tobacco abuse with centrilobular emphysema. - he will try to gradual quit on his own - continue stiolto  Joint pain. - followed by Dr. Bo Merino with rheumatology  Time Spent Involved in Patient Care on Day of Examination:  32 minutes  Follow up:   Patient Instructions  Will schedule CT chest for February 2023 and follow up after that  Medication List:   Allergies as of 05/19/2021       Reactions   Watermelon [citrullus Vulgaris] Itching   Any melon   Coconut Oil Hives        Medication List  Accurate as of May 19, 2021  9:35 AM. If you have any questions, ask your nurse or doctor.          aspirin 325 MG tablet aspirin 325 mg tablet  Take 1 tablet every day by oral route.   diltiazem 120 MG 24 hr capsule Commonly known as: CARDIZEM CD Take 1 capsule (120 mg total) by mouth daily.   meloxicam 7.5 MG tablet Commonly known as: MOBIC 1-2 po qd prn   Stiolto Respimat 2.5-2.5 MCG/ACT Aers Generic drug: Tiotropium Bromide-Olodaterol Inhale 2 puffs into the lungs daily.        Signature:  Chesley Mires, MD [place] Pager - [phone] 05/19/2021, 9:35 AM

## 2021-05-19 NOTE — Patient Instructions (Signed)
Will schedule CT chest for February 2023 and follow up after that

## 2021-05-20 ENCOUNTER — Ambulatory Visit (INDEPENDENT_AMBULATORY_CARE_PROVIDER_SITE_OTHER): Payer: Medicare Other | Admitting: Internal Medicine

## 2021-05-20 ENCOUNTER — Encounter: Payer: Self-pay | Admitting: Internal Medicine

## 2021-05-20 VITALS — BP 113/60 | HR 91 | Temp 98.6°F | Resp 16 | Ht 72.0 in | Wt 206.0 lb

## 2021-05-20 DIAGNOSIS — I4891 Unspecified atrial fibrillation: Secondary | ICD-10-CM

## 2021-05-20 DIAGNOSIS — R5383 Other fatigue: Secondary | ICD-10-CM

## 2021-05-20 DIAGNOSIS — J849 Interstitial pulmonary disease, unspecified: Secondary | ICD-10-CM | POA: Diagnosis not present

## 2021-05-20 DIAGNOSIS — Z0000 Encounter for general adult medical examination without abnormal findings: Secondary | ICD-10-CM

## 2021-05-20 DIAGNOSIS — Z0001 Encounter for general adult medical examination with abnormal findings: Secondary | ICD-10-CM

## 2021-05-20 LAB — LIPID PANEL
Cholesterol: 132 mg/dL (ref 0–200)
HDL: 48.4 mg/dL (ref >39.00)
LDL Cholesterol: 65 mg/dL (ref 0–99)
NonHDL: 83.39
Total CHOL/HDL Ratio: 3
Triglycerides: 91 mg/dL (ref 0.0–149.0)
VLDL: 18.2 mg/dL (ref 0.0–40.0)

## 2021-05-20 NOTE — Assessment & Plan Note (Signed)
Here for CPX Paroxysmal A. fib: currently not an active problem. Tobacco abuse: Not ready to quit. Profound fatigue, myalgias, arthralgias: See previous visits.  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  Next high-resolution CT chest 08-2021. Also saw rheumatology Is the patient's   observation that when he took his steroids for few weeks back in August 2022, all symptoms improved.  Currently fatigue has decreased, still needs to take a nap, arthralgias and myalgias unchanged. Neck pain: Was seen recently by one of my partners.  Took meloxicam, now better. R shoulder pain: Related to DJD, has been off for a shoulder replacement. RTC 6 months

## 2021-05-20 NOTE — Patient Instructions (Addendum)
Vaccines I recommend you: Tdap (tetanus shot) Shingrix Bivalent covid vaccine   Please reach out to the gastroenterology office, you are due for another colonoscopy. [phone]    GO TO THE LAB : Get the blood work     [place], [place] back for  a check up in 6 months        "Living will", "[place] of attorney": Advanced care planning  (If you already have a living will or healthcare power of attorney, please bring the copy to be scanned in your chart.)  Advance care planning is a process that supports adults in  understanding and sharing their preferences regarding future medical care.   The patient's preferences are recorded in documents called Advance Directives.    Advanced directives are completed (and can be modified at any time) while the patient is in full mental capacity.   The documentation should be available at all times to the patient, the family and the healthcare providers.  Bring in a copy to be scanned in your chart is an excellent idea and is recommended   This legal documents direct treatment decision making and/or appoint a surrogate to make the decision if the patient is not capable to do so.    Advance directives can be documented in many types of formats,  documents have names such as:  Lliving will  Durable power of attorney for healthcare (healthcare proxy or healthcare power of attorney)  Combined directives  Physician orders for life-sustaining treatment    More information at:  [email or web address]

## 2021-05-20 NOTE — Assessment & Plan Note (Signed)
Td 2012.  Booster recommended Zoster 2014  Shingrix: d/w  pt  PNM : 23 2012; PNM 13: 2021 Covid vax:  bivalent booster rec   Had a flu shot   CCS:  Reports a Cscope 2009, elsewhere, "benign polyps", cscope 02/2020, multiple polyps, next 1 year , advised to call GI Prostate ca screening: DRE and PSA wnl 2021, no sxs   Diet, exercise: Discussed Tobacco: Not ready to quit.   Labs reviewed: Check FLP POA discussed.

## 2021-05-20 NOTE — Progress Notes (Signed)
Subjective:    Patient ID: Lawrence Rivas, male    DOB: 04-09-1951, 70 y.o.   MRN: [id number]  DOS:  05/20/2021 Type of visit - description: CPX  Here for CPX.  Other issues  discussed as well. Continue with myalgias and arthralgias. Had neck pain: Better.   Review of Systems Specifically denies chest pain or difficulty breathing.  No fever.  No LUTS.  Other than above, a 14 point review of systems is negative     Past Medical History:  Diagnosis Date   Adenomatous colon polyp    TA and SS   Erectile dysfunction    Numbness and tingling    right arm    Paroxysmal A-fib (HCC)    no recurrence s/p atrial flutter ablation (previously atrial flutter felt to degenerate into afib)   Psoriasis    TIA (transient ischemic attack) 2004    Past Surgical History:  Procedure Laterality Date   A FLUTTER ABLATION     March 2011 at [facility] by Dr Lyanne Co Dixat   ATRIAL FIBRILLATION ABLATION N/A 11/07/2019   Procedure: [place];  Surgeon: Thompson Grayer, MD;  Location: [place] [facility];  Service: Cardiovascular;  Laterality: N/A;   implantable loop recorder insertion  09/11/2019   Medtronic Reveal Linq model LNQ 22 (SN [id number]) implantable loop recorder implanted by Dr Rayann Heman for afib management and evaluation of palpitations   TONSILLECTOMY     TOTAL HIP ARTHROPLASTY     right   Social History   Socioeconomic History   Marital status: Soil scientist    Spouse name: Not on file   Number of children: 1   Years of education: Not on file   Highest education level: Not on file  Occupational History   Occupation: Architect    Comment: [employer]    Occupation: retired     Fish farm manager: [employer]  Tobacco Use   Smoking status: Every Day    Packs/day: 0.50    Years: 50.00    Pack years: 25.00    Types: Cigarettes    Start date: 49   Smokeless tobacco: Never   Tobacco comments:    < 1/2 pack a day as of 05-2021   Vaping Use   Vaping  Use: Never used  Substance and Sexual Activity   Alcohol use: Yes    Comment: 2 drinks of scotch per day   Drug use: No   Sexual activity: Not on file  Other Topics Concern   Not on file  Social History Narrative   Household: pt and wife     Social Determinants of Health   Financial Resource Strain: Not on file  Food Insecurity: Not on file  Transportation Needs: Not on file  Physical Activity: Not on file  Stress: Not on file  Social Connections: Not on file  Intimate Partner Violence: Not on file    Allergies as of 05/20/2021       Reactions   Watermelon [citrullus Vulgaris] Itching   Any melon   Coconut Oil Hives        Medication List        Accurate as of May 20, 2021  7:33 PM. If you have any questions, ask your nurse or doctor.          STOP taking these medications    meloxicam 7.5 MG tablet Commonly known as: MOBIC Stopped by: Kathlene November, MD       TAKE these  medications    aspirin 325 MG tablet aspirin 325 mg tablet  Take 1 tablet every day by oral route.   diltiazem 120 MG 24 hr capsule Commonly known as: CARDIZEM CD Take 1 capsule (120 mg total) by mouth daily.   Stiolto Respimat 2.5-2.5 MCG/ACT Aers Generic drug: Tiotropium Bromide-Olodaterol Inhale 2 puffs into the lungs daily.           Objective:   Physical Exam BP 113/60 (BP Location: Left Arm, Patient Position: Sitting, Cuff Size: Large)   Pulse 91   Temp 98.6 F (37 C) (Oral)   Resp 16   Ht 6' (1.829 m)   Wt 206 lb (93.4 kg)   SpO2 98%   BMI 27.94 kg/m  General: Well developed, NAD, BMI noted Neck: No  thyromegaly  HEENT:  Normocephalic . Face symmetric, atraumatic Lungs:  CTA B Normal respiratory effort, no intercostal retractions, no accessory muscle use. Heart: RRR,  no murmur.  Abdomen:  Not distended, soft, non-tender. No rebound or rigidity.   Lower extremities: no pretibial edema bilaterally  Skin: Exposed areas without rash. Not pale. Not  jaundice Neurologic:  alert & oriented X3.  Speech normal, gait appropriate for age and unassisted Strength symmetric and appropriate for age.  Psych: Cognition and judgment appear intact.  Cooperative with normal attention span and concentration.  Behavior appropriate. No anxious or depressed appearing.     Assessment     ASSESSMENT P- Atrial fibrillation: Ablation remotely, symptoms resurfaced 2021, loop implantation,  started Xarelto 09-2019, had an ablation April 2021.  Xarelto DC 02-2020 Tobacco abuse Erectile dysfunction TIA 2004   Emphysema per CT 01-2021 + FH CAD Father   PLAN Here for CPX Paroxysmal A. fib: currently not an active problem. Tobacco abuse: Not ready to quit. Profound fatigue, myalgias, arthralgias: See previous visits.  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  Next high-resolution CT chest 08-2021. Also saw rheumatology Is the patient's   observation that when he took his steroids for few weeks back in August 2022, all symptoms improved.  Currently fatigue has decreased, still needs to take a nap, arthralgias and myalgias unchanged. Neck pain: Was seen recently by one of my partners.  Took meloxicam, now better. R shoulder pain: Related to DJD, has been off for a shoulder replacement. RTC 6 months  In addition to CPX, I addressed his chronic medical problems, reviewed the chart and the office visits from pulmonary and rheumatology.  Also review available labs  This visit occurred during the SARS-CoV-2 public health emergency.  Safety protocols were in place, including screening questions prior to the visit, additional usage of staff PPE, and extensive cleaning of exam room while observing appropriate contact time as indicated for disinfecting solutions.

## 2021-05-20 NOTE — Progress Notes (Signed)
Carelink Summary Report / Loop Recorder 

## 2021-06-03 NOTE — Progress Notes (Signed)
Office Visit Note  Patient: Lawrence Rivas             Date of Birth: 11-22-1950           MRN: [id number]             PCP: Colon Branch, MD Referring: Colon Branch, MD Visit Date: 06/11/2021 Occupation: '@GUAROCC' @  Subjective:  Pain in multiple joints.   History of Present Illness: Lawrence Rivas is a 70 y.o. male with history of osteoarthritis.  He states he continues to have pain and discomfort in multiple joints.  He is very stiff in the morning.  He states that he continues to have fatigue.  He has to take naps during the daytime.  He has been under a lot of stress due to some of the family issues.  He believes his right knee joint is better compared to the last visit.  He continues to have some pain and stiffness in his shoulders and knee joints.  Activities of Daily Living:  Patient reports morning stiffness for all day. Patient Reports nocturnal pain.  Difficulty dressing/grooming: Reports Difficulty climbing stairs: Denies Difficulty getting out of chair: Denies Difficulty using hands for taps, buttons, cutlery, and/or writing: Denies  Review of Systems  Constitutional:  Positive for fatigue.  HENT:  Positive for mouth dryness. Negative for mouth sores and nose dryness.   Eyes:  Negative for pain, itching and dryness.  Respiratory:  Negative for shortness of breath and difficulty breathing.   Cardiovascular:  Negative for chest pain and palpitations.  Gastrointestinal:  Negative for blood in stool, constipation and diarrhea.  Endocrine: Negative for increased urination.  Genitourinary:  Negative for difficulty urinating.  Musculoskeletal:  Positive for joint pain, joint pain and morning stiffness. Negative for joint swelling, myalgias, muscle tenderness and myalgias.  Skin:  Negative for color change, rash and redness.  Allergic/Immunologic: Negative for susceptible to infections.  Neurological:  Negative for dizziness, numbness, headaches, memory loss and weakness.   Hematological:  Positive for bruising/bleeding tendency.  Psychiatric/Behavioral:  Negative for confusion.    PMFS History:  Patient Active Problem List   Diagnosis Date Noted   Aortic atherosclerosis ([place]) 04/17/2021   Neck pain 04/16/2021   Chronic midline low back pain without sciatica 04/16/2021   Chronic pain of right knee 04/16/2021   Abnormal CT scan, chest 02/12/2021   Pneumonia 02/12/2021   Psoriasis 02/12/2021   Emphysema lung ([place]) 02/12/2021   PCP NOTESS>>>>>>>>>>>>.. 04/02/2020   Secondary hypercoagulable state ([place]) 11/27/2019   Palpitations 09/11/2019   Allergic rhinitis 10/24/2012   Annual physical exam 06/18/2011   Erectile dysfunction 06/18/2011   Atrial fibrillation ([place]) 03/18/2011   Tobacco abuse 03/18/2011    Past Medical History:  Diagnosis Date   Adenomatous colon polyp    TA and SS   Erectile dysfunction    Numbness and tingling    right arm    Paroxysmal A-fib (HCC)    no recurrence s/p atrial flutter ablation (previously atrial flutter felt to degenerate into afib)   Psoriasis    TIA (transient ischemic attack) 2004    Family History  Problem Relation Age of Onset   Stroke Mother        in her 31-90s   Uterine cancer Mother    Coronary artery disease Father 10   Diabetes Maternal Grandmother    Healthy Son    Colon cancer Neg Hx    Prostate cancer Neg Hx    Past  Surgical History:  Procedure Laterality Date   A FLUTTER ABLATION     March 2011 at [facility] by Dr Lyanne Co Dixat   ATRIAL FIBRILLATION ABLATION N/A 11/07/2019   Procedure: [place];  Surgeon: Thompson Grayer, MD;  Location: [place] [facility];  Service: Cardiovascular;  Laterality: N/A;   implantable loop recorder insertion  09/11/2019   Medtronic Reveal Linq model LNQ 22 (SN [id number]) implantable loop recorder implanted by Dr Rayann Heman for afib management and evaluation of palpitations   TONSILLECTOMY     TOTAL HIP ARTHROPLASTY     right   Social  History   Social History Narrative   Household: pt and wife     Immunization History  Administered Date(s) Administered   Fluad Quad(high Dose 65+) 04/01/2020, 04/28/2021   Influenza Split 05/01/2014   Influenza,inj,Quad PF,6+ Mos 06/05/2013   Influenza,inj,quad, With Preservative 04/13/2019   Influenza-Unspecified 05/17/2016   PFIZER(Purple Top)SARS-COV-2 Vaccination 07/29/2019, 08/19/2019, 10/22/2020   Pneumococcal Conjugate-13 04/01/2020   Pneumococcal Polysaccharide-23 06/18/2011   Tdap 06/18/2011   Zoster, Live 06/05/2013     Objective: Vital Signs: BP 118/74 (BP Location: Left Arm, Patient Position: Sitting, Cuff Size: Normal)   Pulse 85   Ht 6' (1.829 m)   Wt 208 lb (94.3 kg)   BMI 28.21 kg/m    Physical Exam Vitals and nursing note reviewed.  Constitutional:      Appearance: He is well-developed.  HENT:     Head: Normocephalic and atraumatic.  Eyes:     Conjunctiva/sclera: Conjunctivae normal.     Pupils: Pupils are equal, round, and reactive to light.  Cardiovascular:     Rate and Rhythm: Normal rate and regular rhythm.     Heart sounds: Normal heart sounds.  Pulmonary:     Effort: Pulmonary effort is normal.     Breath sounds: Normal breath sounds.  Abdominal:     General: Bowel sounds are normal.     Palpations: Abdomen is soft.  Musculoskeletal:     Cervical back: Normal range of motion and neck supple.  Skin:    General: Skin is warm and dry.     Capillary Refill: Capillary refill takes less than 2 seconds.     Comments: Molluscum contagiosum lesions on bilateral lower extremities  Neurological:     Mental Status: He is alert and oriented to person, place, and time.  Psychiatric:        Behavior: Behavior normal.     Musculoskeletal Exam: C-spine and lumbar spine were in good range of motion.  He has some discomfort range of motion of the lumbar spine.  He had painful range of motion of bilateral shoulder joints.  Elbow joints and wrist joints  with good range of motion.  He had bilateral PIP and DIP thickening with no synovitis.  Hip joints with good range of motion.  Knee joints in good range of motion.  He had warmth on palpation of his right knee joint and a small Baker's cyst was noted.  There was no tenderness over ankles or MTPs.  CDAI Exam: CDAI Score: -- Patient Global: --; Provider Global: -- Swollen: --; Tender: -- Joint Exam 06/11/2021   No joint exam has been documented for this visit   There is currently no information documented on the homunculus. Go to the Rheumatology activity and complete the homunculus joint exam.  Investigation: No additional findings.  Imaging: CUP PACEART INCLINIC DEVICE CHECK  Result Date: 06/09/2021 Loop check in clinic. Battery status:  good __. R-waves __mV. _0_ symptom episodes, _0_ tachy episodes, 0__ pause episodes, _0_ brady episodes. _0_ AF episodes (_0.1_% burden). Monthly summary reports and ROV with _EP 6 mo__.  CUP PACEART REMOTE DEVICE CHECK  Result Date: 06/09/2021 ILR summary report received. Battery status OK. Normal device function. No new symptom, tachy, brady, or pause episodes. No new AF episodes. Monthly summary reports and ROV/PRN LH  XR Foot 2 Views Left  Result Date: 05/13/2021 First MTP narrowing and subluxation was noted.  No PIP and DIP narrowing was noted.  Dorsal spurring was noted.  No tibiotalar or subtalar joint space narrowing was noted.  Inferior calcaneal spur was noted.  No erosive changes were noted. Impression: These findings are consistent with osteoarthritis of the foot.  XR Foot 2 Views Right  Result Date: 05/13/2021 First MTP narrowing and subluxation was noted.  No PIP and DIP narrowing was noted.  Dorsal spurring was noted.  No tibiotalar or subtalar joint space narrowing was noted.  Inferior calcaneal spur was noted.  No erosive changes were noted. Impression: These findings are consistent with osteoarthritis of the foot.  XR KNEE 3 VIEW  RIGHT  Result Date: 05/13/2021 Mild medial compartment narrowing was noted.  Mild patellofemoral narrowing was noted.  No chondrocalcinosis was noted. Impression: These findings are consistent with mild osteoarthritis and mild chondromalacia patella.  XR Pelvis 1-2 Views  Result Date: 05/13/2021 No SI joint sclerosis or narrowing was noted.  No erosive changes were noted.  Right hip prosthesis was noted. Impression: Unremarkable x-ray of the SI joints.   Recent Labs: Lab Results  Component Value Date   WBC 7.6 05/13/2021   HGB 13.8 05/13/2021   PLT 354 05/13/2021   NA 138 05/13/2021   K 4.6 05/13/2021   CL 100 05/13/2021   CO2 29 05/13/2021   GLUCOSE 93 05/13/2021   BUN 14 05/13/2021   CREATININE 0.66 (L) 05/13/2021   BILITOT 0.4 05/13/2021   ALKPHOS 73 01/21/2021   AST 20 05/13/2021   ALT 17 05/13/2021   PROT 7.0 05/13/2021   ALBUMIN 2.8 (L) 01/21/2021   CALCIUM 9.1 05/13/2021   GFRAA 101 10/31/2019   May 13, 2021 ESR 67, anti-CCP negative, ACE 62, uric acid 4.4, HLA-B27 negative, G6PD normal  01/21/21: ANCA screen negative, RF<14, ANA negative, ESR 98. 02/12/21: ESR 89. 03/19/21: ESR 40  Speciality Comments: No specialty comments available.  Procedures:  No procedures performed Allergies: Watermelon [citrullus vulgaris] and Coconut oil   Assessment / Plan:     Visit Diagnoses: Chronic right shoulder pain - According to the patient he has severe osteoarthritis and was advised total shoulder replacement by Dr. Onnie Graham.  He had a cortisone injection by Dr. Onnie Graham.  He has appointment with Dr. Onnie Graham next week.  Chronic SI joint pain - History of chronic lower back pain and SI joint pain.  X-rays were unremarkable.  X-ray findings were discussed with the patient.  Status post total hip replacement, right - In 2006 while living in [place].  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  Trochanteric bursitis of both hips-he has been experiencing tenderness over bilateral trochanteric  bursa.  Primary osteoarthritis of right knee - X-rays showed mild OA and mild chondromalacia patella. Pain in the right knee joint since the viral infection in 04/22.  Warmth was noted on the palpation.  He also had a small Baker's cyst.  His sedimentation rate is fluctuating.  I discussed that the inflammation could be due to underlying inflammatory osteoarthritis.  He also gives history of psoriasis.  I do not see any psoriasis patches.  He will be seeing a dermatologist soon.  I also discussed possibility of psoriatic arthritis with a history of trochanteric bursitis, chronic SI joint pain, Planter fasciitis and inflammatory arthritis.  The options would be to try NSAIDs or sulfasalazine.  He has been taking aspirin.  Topical NSAID use was discussed.  I also offered cortisone injection but he declined.  He states if his symptoms get worse he will come in for the cortisone injection.  Primary osteoarthritis of both feet - Bilateral first MTP narrowing and dorsal spurring was noted.  Pes cavus of both feet-for support were discussed.  Plantar fasciitis of left foot - Tenderness noted.  Stretching exercises were advised.  Chronic midline low back pain without sciatica - X-ray showed anterior spurring and facet joint arthropathy.  No syndesmophytes were noted.  X-ray findings were discussed with the patient.  Psoriasis - Not biopsy-proven.  He was diagnosed with psoriasis by dermatologist about 5 years ago.  The rash resolved after using clobetasol per patient.  No nail pitting   Other fatigue - Increased fatigue since the viral infection in April 2022.  Rash - Molluscum contagiosum was noted on the right foot.  He was advised to discuss this further with the dermatologist.  Paroxysmal atrial fibrillation (HCC)-he is on aspirin.  Aortic atherosclerosis (HCC)  Bronchiectasis without complication ([place]) - Diagnosed with viral pneumonia in April 2022.The HRCT showed postinflammatory pulmonary  fibrosis.  He is followed by Dr. Halford Chessman for bronchiectasis.  Centrilobular emphysema (HCC)  Tobacco abuse - Half pack per day for 50 years.  Orders: No orders of the defined types were placed in this encounter.  No orders of the defined types were placed in this encounter.    Follow-Up Instructions: Return in about 3 months (around 09/09/2021) for Osteoarthritis.   Bo Merino, MD  Note - This record has been created using Editor, commissioning.  Chart creation errors have been sought, but may not always  have been located. Such creation errors do not reflect on  the standard of medical care.

## 2021-06-08 NOTE — Progress Notes (Signed)
Cardiology Office Note Date:  06/09/2021  Patient ID:  Lawrence Rivas 1950-11-06, MRN [id number] PCP:  Colon Branch, MD  Electrophysiologist: Dr. Rayann Heman     Chief Complaint:  6 mo  History of Present Illness: Lawrence Rivas is a 70 y.o. male with history of TIA, AFlutter/Fib.  He comes in today to be seen for Dr. Rayann Heman, last seen by him May 2022, he was following with his PMD for fatigue and myalgias.  Pt preferred to not to be on [facility], was on ASA for arthritis.  No changes were made, urged to quit smoking.  Fatigue, SOB suspected to be viral, planned to continue f/u with his PMD and update his echo  Echo looked OK  He saw Dr. 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  This has significantly affected his QOL and sense of wellbeing Says prior to the pneumonia that provoked this that her suspects was actually COVID, he was doing 66miles most days outside, had excellent exertional capacity and now he struggles to get a mile or 2 , and when he tries to push to 34miles it leaves him feeling awful.  No CP, no palpitations or cardiac awareness, he does not think he has had any AFib since his ablation No SOB No near syncope or syncope  AFib Hx Diagnosed 2011 Flutter ablation 2011 Loop implant for arrhythmia surveillance PVI ablation 11/07/2019 AAD Hx Flecainide stopped with development of near syncope  Device information MDT LINQ II implanted 09/11/19, implanted for rhythm management   Past Medical History:  Diagnosis Date   Adenomatous colon polyp    TA and SS   Erectile dysfunction    Numbness and tingling    right arm    Paroxysmal A-fib ([place])    no recurrence s/p atrial flutter ablation (previously atrial flutter felt to degenerate into afib)   Psoriasis    TIA (transient ischemic attack) 2004    Past  Surgical History:  Procedure Laterality Date   A FLUTTER ABLATION     March 2011 at [facility] by Dr Lyanne Co Dixat   ATRIAL FIBRILLATION ABLATION N/A 11/07/2019   Procedure: [place];  Surgeon: Thompson Grayer, MD;  Location: [place] [facility];  Service: Cardiovascular;  Laterality: N/A;   implantable loop recorder insertion  09/11/2019   Medtronic Reveal Linq model LNQ 22 (SN [id number]) implantable loop recorder implanted by Dr Rayann Heman for afib management and evaluation of palpitations   TONSILLECTOMY     TOTAL HIP ARTHROPLASTY     right    Current Outpatient Medications  Medication Sig Dispense Refill   aspirin 325 MG tablet aspirin 325 mg tablet  Take 1 tablet every day by oral route.     diltiazem (CARDIZEM CD) 120 MG 24 hr capsule Take 1 capsule (120 mg total) by mouth daily. 90 capsule 3   Tiotropium Bromide-Olodaterol (STIOLTO RESPIMAT) 2.5-2.5 MCG/ACT AERS Inhale 2 puffs into the lungs daily. 1 each 5   No current facility-administered medications for this visit.    Allergies:   Watermelon [citrullus vulgaris] and Coconut oil   Social History:  The patient  reports that he has been smoking cigarettes. He started smoking about 50 years ago. He has a 25.00 pack-year smoking history. He has never used smokeless tobacco. He reports current alcohol use. He reports that he does not use drugs.  Family History:  The patient's family history includes Coronary artery disease (age of onset: 45) in his father; Diabetes in his maternal grandmother; Healthy in his son; Stroke in his mother; Uterine cancer in his mother.  ROS:  Please see the history of present illness.    All other systems are reviewed and otherwise negative.   PHYSICAL EXAM:  VS:  BP 138/78   Pulse 84   Ht 6' (1.829 m)   Wt 207 lb (93.9 kg)   SpO2 95%   BMI 28.07 kg/m  BMI: Body mass index is 28.07 kg/m. Well nourished, well developed, in no acute distress HEENT: normocephalic,  atraumatic Neck: no JVD, carotid bruits or masses Cardiac:  RRR; no significant murmurs, no rubs, or gallops Lungs:  CTA b/l, no wheezing, rhonchi or rales Abd: soft, nontender MS: no deformity or atrophy Ext: no edema Skin: warm and dry, no rash Neuro:  No gross deficits appreciated Psych: euthymic mood, full affect  ILR site is stable, no tethering or discomfort   EKG:  not done today  Device interrogation done today and reviewed by myself:  Battery is good, no episodes of any kind  01/01/21: TTE IMPRESSIONS   1. Left ventricular ejection fraction, by estimation, is 50 to 55%. The  left ventricle has low normal function. The left ventricle has no regional  wall motion abnormalities. There is mild left ventricular hypertrophy.  Left ventricular diastolic  parameters were normal.   2. Right ventricular systolic function is normal. The right ventricular  size is normal. There is normal pulmonary artery systolic pressure. The  estimated right ventricular systolic pressure is 91.5 mmHg.   3. The mitral valve is normal in structure. No evidence of mitral valve  regurgitation.   4. The aortic valve is tricuspid. Aortic valve regurgitation is not  visualized. No aortic stenosis is present.   5. The inferior vena cava is normal in size with greater than 50%  respiratory variability, suggesting right atrial pressure of 3 mmHg.    11/07/2019: EPS/Ablation  CONCLUSIONS: 1. AFib/ atypical atrial flutter with CL of 200 msec arising from the right superior pulmonary vein upon presentation.   2. Intracardiac echo reveals a moderate sized left atrium with four separate pulmonary veins without evidence of pulmonary vein stenosis. 3. Successful electrical isolation and anatomical encircling of all four pulmonary veins with radiofrequency current.  The patient was noted to have prodigious firing from all four PVs at baseline.  The patient was noted to have spontaneous but isolated firing post  ablation 4. No inducible arrhythmias following ablation both on and off of Isuprel 5. No early apparent complications.   07/25/2018: stress myoview Nuclear stress EF: 50%. The study is normal. This is a low risk study. The left ventricular ejection fraction is mildly decreased (45-54%). Blood pressure demonstrated a normal response to exercise. There was no ST segment deviation noted during stress. No T wave inversion was noted during stress.  Recent Labs: 11/14/2020: TSH 1.67 05/13/2021: ALT 17; BUN 14; Creat 0.66; Hemoglobin 13.8; Platelets 354; Potassium 4.6; Sodium 138  05/20/2021: Cholesterol 132; HDL 48.40; LDL Cholesterol 65; Total CHOL/HDL Ratio 3; Triglycerides 91.0; VLDL 18.2   CrCl cannot be calculated (Patient's most recent lab result is older than the maximum 21 days allowed.).   Wt Readings from Last 3 Encounters:  06/09/21 207 lb (93.9 kg)  05/20/21 206 lb (93.4 kg)  05/19/21 204 lb 12.8 oz (92.9 kg)     Other studies reviewed: Additional studies/records  reviewed today include: summarized above  ASSESSMENT AND PLAN:  Paroxysmal AFib, atypical Aflutter CHA2DS2Vasc is 3, off a/c post ablation Zero % burden  He will c/w PMD, pulmonary/rheumatology. He sleeps poorly wakes several times a night for various reasons, to urinate, pets wake him, and other times he just wakes for no apparent reason. Discussed sleep hygiene and to discuss this with his PMD as well should he need a sleep aid.    Disposition: Continue monthly remotes, in clinic in 18mo, sooner if needed   Current medicines are reviewed at length with the patient today.  The patient did not have any concerns regarding medicines.  Venetia Night, PA-C 06/09/2021 5:52 PM     [address] [place]  [place] [id number] [phone] (office)  [phone] (fax)

## 2021-06-09 ENCOUNTER — Ambulatory Visit: Payer: Medicare Other | Admitting: Physician Assistant

## 2021-06-09 ENCOUNTER — Ambulatory Visit (INDEPENDENT_AMBULATORY_CARE_PROVIDER_SITE_OTHER): Payer: Medicare Other

## 2021-06-09 ENCOUNTER — Encounter: Payer: Self-pay | Admitting: Physician Assistant

## 2021-06-09 ENCOUNTER — Other Ambulatory Visit: Payer: Self-pay

## 2021-06-09 VITALS — BP 138/78 | HR 84 | Ht 72.0 in | Wt 207.0 lb

## 2021-06-09 DIAGNOSIS — Z4509 Encounter for adjustment and management of other cardiac device: Secondary | ICD-10-CM | POA: Diagnosis not present

## 2021-06-09 DIAGNOSIS — I480 Paroxysmal atrial fibrillation: Secondary | ICD-10-CM

## 2021-06-09 LAB — CUP PACEART INCLINIC DEVICE CHECK
Date Time Interrogation Session: 20221128175921
Implantable Pulse Generator Implant Date: 20210301

## 2021-06-09 LAB — CUP PACEART REMOTE DEVICE CHECK
Date Time Interrogation Session: 20221127231433
Implantable Pulse Generator Implant Date: 20210301

## 2021-06-09 NOTE — Patient Instructions (Signed)
Medication Instructions:   Your physician recommends that you continue on your current medications as directed. Please refer to the Current Medication list given to you today.   *If you need a refill on your cardiac medications before your next appointment, please call your pharmacy*   Lab Work: [place]   If you have labs (blood work) drawn today and your tests are completely normal, you will receive your results only by: [place] (if you have MyChart) OR A paper copy in the mail If you have any lab test that is abnormal or we need to change your treatment, we will call you to review the results.   Testing/Procedures: NONE ORDERED  TODAY    Follow-Up: At [facility], you and your health needs are our priority.  As part of our continuing mission to provide you with exceptional heart care, we have created designated Provider Care Teams.  These Care Teams include your primary Cardiologist (physician) and Advanced Practice Providers (APPs -  Physician Assistants and Nurse Practitioners) who all work together to provide you with the care you need, when you need it.  We recommend signing up for the patient portal called "MyChart".  Sign up information is provided on this After Visit Summary.  MyChart is used to connect with patients for Virtual Visits (Telemedicine).  Patients are able to view lab/test results, encounter notes, upcoming appointments, etc.  Non-urgent messages can be sent to your provider as well.   To learn more about what you can do with MyChart, go to [email or web address].    Your next appointment:   6 month(s)  The format for your next appointment:   In Person  Provider:   You will see one of the following Advanced Practice Providers on your designated Care Team:   Tommye Standard, [place]  }  Other Instructions

## 2021-06-11 ENCOUNTER — Ambulatory Visit: Payer: Medicare Other | Admitting: Rheumatology

## 2021-06-11 ENCOUNTER — Other Ambulatory Visit: Payer: Self-pay

## 2021-06-11 ENCOUNTER — Encounter: Payer: Self-pay | Admitting: Rheumatology

## 2021-06-11 VITALS — BP 118/74 | HR 85 | Ht 72.0 in | Wt 208.0 lb

## 2021-06-11 DIAGNOSIS — R5383 Other fatigue: Secondary | ICD-10-CM

## 2021-06-11 DIAGNOSIS — L409 Psoriasis, unspecified: Secondary | ICD-10-CM

## 2021-06-11 DIAGNOSIS — R21 Rash and other nonspecific skin eruption: Secondary | ICD-10-CM

## 2021-06-11 DIAGNOSIS — M25512 Pain in left shoulder: Secondary | ICD-10-CM | POA: Diagnosis not present

## 2021-06-11 DIAGNOSIS — M19071 Primary osteoarthritis, right ankle and foot: Secondary | ICD-10-CM

## 2021-06-11 DIAGNOSIS — M533 Sacrococcygeal disorders, not elsewhere classified: Secondary | ICD-10-CM | POA: Diagnosis not present

## 2021-06-11 DIAGNOSIS — I700 Atherosclerosis of aorta: Secondary | ICD-10-CM

## 2021-06-11 DIAGNOSIS — M25511 Pain in right shoulder: Secondary | ICD-10-CM | POA: Diagnosis not present

## 2021-06-11 DIAGNOSIS — G8929 Other chronic pain: Secondary | ICD-10-CM

## 2021-06-11 DIAGNOSIS — M19072 Primary osteoarthritis, left ankle and foot: Secondary | ICD-10-CM

## 2021-06-11 DIAGNOSIS — M722 Plantar fascial fibromatosis: Secondary | ICD-10-CM

## 2021-06-11 DIAGNOSIS — Q6671 Congenital pes cavus, right foot: Secondary | ICD-10-CM

## 2021-06-11 DIAGNOSIS — I480 Paroxysmal atrial fibrillation: Secondary | ICD-10-CM

## 2021-06-11 DIAGNOSIS — M545 Low back pain, unspecified: Secondary | ICD-10-CM

## 2021-06-11 DIAGNOSIS — M7062 Trochanteric bursitis, left hip: Secondary | ICD-10-CM

## 2021-06-11 DIAGNOSIS — Z96641 Presence of right artificial hip joint: Secondary | ICD-10-CM

## 2021-06-11 DIAGNOSIS — M7061 Trochanteric bursitis, right hip: Secondary | ICD-10-CM

## 2021-06-11 DIAGNOSIS — M1711 Unilateral primary osteoarthritis, right knee: Secondary | ICD-10-CM | POA: Diagnosis not present

## 2021-06-11 DIAGNOSIS — M19011 Primary osteoarthritis, right shoulder: Secondary | ICD-10-CM | POA: Diagnosis not present

## 2021-06-11 DIAGNOSIS — Q6672 Congenital pes cavus, left foot: Secondary | ICD-10-CM

## 2021-06-11 DIAGNOSIS — Z720 Tobacco use: Secondary | ICD-10-CM

## 2021-06-11 DIAGNOSIS — J479 Bronchiectasis, uncomplicated: Secondary | ICD-10-CM

## 2021-06-11 DIAGNOSIS — J432 Centrilobular emphysema: Secondary | ICD-10-CM

## 2021-06-17 NOTE — Progress Notes (Signed)
Carelink Summary Report / Loop Recorder 

## 2021-07-15 ENCOUNTER — Ambulatory Visit: Payer: Medicare Other | Admitting: Gastroenterology

## 2021-07-15 ENCOUNTER — Ambulatory Visit (INDEPENDENT_AMBULATORY_CARE_PROVIDER_SITE_OTHER): Payer: Medicare Other

## 2021-07-15 ENCOUNTER — Encounter: Payer: Self-pay | Admitting: Gastroenterology

## 2021-07-15 VITALS — BP 126/66 | HR 84 | Ht 71.75 in | Wt 212.4 lb

## 2021-07-15 DIAGNOSIS — Z7902 Long term (current) use of antithrombotics/antiplatelets: Secondary | ICD-10-CM | POA: Diagnosis not present

## 2021-07-15 DIAGNOSIS — Z86010 Personal history of colonic polyps: Secondary | ICD-10-CM | POA: Diagnosis not present

## 2021-07-15 DIAGNOSIS — I480 Paroxysmal atrial fibrillation: Secondary | ICD-10-CM | POA: Diagnosis not present

## 2021-07-15 DIAGNOSIS — Z4509 Encounter for adjustment and management of other cardiac device: Secondary | ICD-10-CM

## 2021-07-15 LAB — CUP PACEART REMOTE DEVICE CHECK
Date Time Interrogation Session: 20230102231340
Implantable Pulse Generator Implant Date: 20210301

## 2021-07-15 NOTE — Patient Instructions (Signed)
If you are age 72 or older, your body mass index should be between 23-30. Your Body mass index is 29 kg/m. If this is out of the aforementioned range listed, please consider follow up with your Primary Care Provider.  If you are age 22 or younger, your body mass index should be between 19-25. Your Body mass index is 29 kg/m. If this is out of the aformentioned range listed, please consider follow up with your Primary Care Provider.   ________________________________________________________  The [place] [facility] providers would like to encourage you to use [facility] to communicate with providers for non-urgent requests or questions.  Due to long hold times on the telephone, sending your provider a message by [facility] may be a faster and more efficient way to get a response.  Please allow 48 business hours for a response.  Please remember that this is for non-urgent requests.  _______________________________________________________  It has been recommended to you by your physician that you have a(n) Colonoscopy completed. Per your request, we did not schedule the procedure(s) today. Please contact our office at [phone] should you decide to have the procedure completed. You will be scheduled for a pre-visit and procedure at that time.  It was a pleasure to see you today!  Thank you for trusting me with your gastrointestinal care!

## 2021-07-15 NOTE — Progress Notes (Signed)
[facility] Consult Note:  History: Lawrence Rivas 07/15/2021  Referring provider: Colon Branch, MD  Reason for consult/chief complaint: hx of colon polyps (To discuss having colon )   Subjective  HPI:  Colonoscopy August 2021 for chronic diarrhea and heme positive stool.  Biopsies negative for microscopic colitis.  14 polyps removed, TA's and SSP's, more than 1 greater than 10 mm.  (Prior colonoscopy in 2009) 06/01/2021 cardiology office note reviewed.  Patient stable after prior A. fib/flutter ablations, not on [place].  Last EF normal this year. Similar symptoms to when I saw him last year, though says they are less frequent than before.  Few episodes urgency for BM More often the trouble is difficult to get BM started in AM Has not noted any consistent food triggers Feels it is normal 60-70% of the time.  Has been up and down with his fatigue and recovery from respiratory illness over the course of this year as noted below. Currently does not have chronic cough or sputum production or dyspnea with modest exertion. ROS:  Review of Systems  Constitutional:  Positive for fatigue. Negative for appetite change and unexpected weight change.  HENT:  Negative for mouth sores and voice change.   Eyes:  Negative for pain and redness.  Respiratory:  Negative for cough and shortness of breath.   Cardiovascular:  Negative for chest pain and palpitations.  Genitourinary:  Negative for dysuria and hematuria.  Musculoskeletal:  Negative for arthralgias and myalgias.  Skin:  Negative for pallor and rash.  Neurological:  Negative for weakness and headaches.  Hematological:  Negative for adenopathy.   Had PNA and feels like has not fully recovered from arthralgias and fatigue.Saw Sood, CT showed some interstitial changes.ESR 98    Awaiting Rheumatoloy appt Saw pulmonary Past Medical History: Past Medical History:  Diagnosis Date   Adenomatous colon polyp    TA and  SS   Erectile dysfunction    Numbness and tingling    right arm    Paroxysmal A-fib (HCC)    no recurrence s/p atrial flutter ablation (previously atrial flutter felt to degenerate into afib)   Psoriasis    TIA (transient ischemic attack) 2004     Past Surgical History: Past Surgical History:  Procedure Laterality Date   A FLUTTER ABLATION     March 2011 at [facility] by Dr Lyanne Co Dixat   ATRIAL FIBRILLATION ABLATION N/A 11/07/2019   Procedure: [place];  Surgeon: Thompson Grayer, MD;  Location: [place] [facility];  Service: Cardiovascular;  Laterality: N/A;   implantable loop recorder insertion  09/11/2019   Medtronic Reveal Linq model LNQ 22 (SN [id number]) implantable loop recorder implanted by Dr Rayann Heman for afib management and evaluation of palpitations   TONSILLECTOMY     TOTAL HIP ARTHROPLASTY     right     Family History: Family History  Problem Relation Age of Onset   Stroke Mother        in her 10-90s   Uterine cancer Mother    Coronary artery disease Father 5   Diabetes Maternal Grandmother    Healthy Son    Colon cancer Neg Hx    Prostate cancer Neg Hx     Social History: Social History   Socioeconomic History   Marital status: Soil scientist    Spouse name: Not on file   Number of children: 1   Years of education: Not on file   Highest education level: Not  on file  Occupational History   Occupation: Architect    Comment: [employer]    Occupation: retired     Fish farm manager: [employer]  Tobacco Use   Smoking status: Every Day    Packs/day: 0.50    Years: 50.00    Pack years: 25.00    Types: Cigarettes    Start date: 33   Smokeless tobacco: Never   Tobacco comments:    < 1/2 pack a day as of 05-2021   Vaping Use   Vaping Use: Never used  Substance and Sexual Activity   Alcohol use: Yes    Comment: 2 drinks of scotch per day   Drug use: No   Sexual activity: Not on file  Other Topics Concern   Not on file   Social History Narrative   Household: pt and wife     Social Determinants of Health   Financial Resource Strain: Not on file  Food Insecurity: Not on file  Transportation Needs: Not on file  Physical Activity: Not on file  Stress: Not on file  Social Connections: Not on file    Allergies: Allergies  Allergen Reactions   Watermelon [Citrullus Vulgaris] Itching    Any melon   Coconut Oil Hives    Outpatient Meds: Current Outpatient Medications  Medication Sig Dispense Refill   aspirin 325 MG tablet aspirin 325 mg tablet  Take 1 tablet every day by oral route.     diltiazem (CARDIZEM CD) 120 MG 24 hr capsule Take 1 capsule (120 mg total) by mouth daily. 90 capsule 3   Tiotropium Bromide-Olodaterol (STIOLTO RESPIMAT) 2.5-2.5 MCG/ACT AERS Inhale 2 puffs into the lungs daily. 1 each 5   No current facility-administered medications for this visit.      ___________________________________________________________________ Objective   Exam:  BP 126/66 (BP Location: Left Arm, Patient Position: Sitting, Cuff Size: Normal)    Pulse 84    Ht 5' 11.75" (1.822 m)    Wt 212 lb 6 oz (96.3 kg)    BMI 29.00 kg/m  Wt Readings from Last 3 Encounters:  07/15/21 212 lb 6 oz (96.3 kg)  06/11/21 208 lb (94.3 kg)  06/09/21 207 lb (93.9 kg)    General: Well-appearing, normal vocal quality Eyes: sclera anicteric, no redness ENT: oral mucosa moist without lesions, no cervical or supraclavicular lymphadenopathy CV: RRR without murmur, S1/S2, no JVD, no peripheral edema Resp: clear to auscultation bilaterally, normal RR and effort noted GI: soft, no tenderness, with active bowel sounds. No guarding or palpable organomegaly noted. Skin; warm and dry, no rash or jaundice noted Neuro: awake, alert and oriented x 3. Normal gross motor function and fluent speech  Labs:  CBC    Component Value Date/Time   WBC 7.6 05/13/2021 1039   RBC 4.22 05/13/2021 1039   HGB 13.8 05/13/2021 1039   HGB  13.9 10/31/2019 1351   HCT 40.8 05/13/2021 1039   HCT 40.8 10/31/2019 1351   PLT 354 05/13/2021 1039   PLT 206 10/31/2019 1351   MCV 96.7 05/13/2021 1039   MCV 100 (H) 10/31/2019 1351   MCH 32.7 05/13/2021 1039   MCHC 33.8 05/13/2021 1039   RDW 14.0 05/13/2021 1039   RDW 13.4 10/31/2019 1351   LYMPHSABS 2,082 05/13/2021 1039   LYMPHSABS 2.1 10/31/2019 1351   MONOABS 0.9 01/21/2021 1447   EOSABS 129 05/13/2021 1039   EOSABS 0.1 10/31/2019 1351   BASOSABS 61 05/13/2021 1039   BASOSABS 0.0 10/31/2019 1351  Radiologic Studies: 01/01/21: TTE IMPRESSIONS   1. Left ventricular ejection fraction, by estimation, is 50 to 55%. The  left ventricle has low normal function. The left ventricle has no regional  wall motion abnormalities. There is mild left ventricular hypertrophy.  Left ventricular diastolic  parameters were normal.   2. Right ventricular systolic function is normal. The right ventricular  size is normal. There is normal pulmonary artery systolic pressure. The  estimated right ventricular systolic pressure is 24.2 mmHg.   3. The mitral valve is normal in structure. No evidence of mitral valve  regurgitation.   4. The aortic valve is tricuspid. Aortic valve regurgitation is not  visualized. No aortic stenosis is present.   5. The inferior vena cava is normal in size with greater than 50%  respiratory variability, suggesting right atrial pressure of 3 mmHg    Assessment: Encounter Diagnoses  Name Primary?   Personal history of colonic polyps Yes   Long term (current) use of antithrombotics/antiplatelets    Paroxysmal atrial fibrillation (HCC)     Increased risk colorectal cancer surveillance, 14 polyps on last colonoscopy (though it had been many years since his prior colonoscopy).  Nevertheless, current guidelines indicate surveillance in about a year.  He was agreeable after discussion of procedure and risks.  The benefits and risks of the planned procedure were  described in detail with the patient or (when appropriate) their health care proxy.  Risks were outlined as including, but not limited to, bleeding, infection, perforation, adverse medication reaction leading to cardiac or pulmonary decompensation, pancreatitis (if ERCP).  The limitation of incomplete mucosal visualization was also discussed.  No guarantees or warranties were given.  Irregular bowel habits, he thinks it may be dietary related, especially if he has red meat.  No obstructive, inflammatory or other identifiable cause on exam last year.  Cardiac condition is stable.  Pulmonary condition currently stable, has follow-up with that clinic soon.  Thank you for the courtesy of this consult.  Please call me with any questions or concerns.  Nelida Meuse III  CC: Referring provider noted above

## 2021-07-17 ENCOUNTER — Encounter: Payer: Self-pay | Admitting: Gastroenterology

## 2021-07-24 ENCOUNTER — Other Ambulatory Visit: Payer: Self-pay | Admitting: Internal Medicine

## 2021-07-25 ENCOUNTER — Other Ambulatory Visit: Payer: Self-pay

## 2021-07-25 ENCOUNTER — Ambulatory Visit (INDEPENDENT_AMBULATORY_CARE_PROVIDER_SITE_OTHER): Payer: Medicare Other | Admitting: Family Medicine

## 2021-07-25 ENCOUNTER — Encounter: Payer: Self-pay | Admitting: Family Medicine

## 2021-07-25 ENCOUNTER — Ambulatory Visit (HOSPITAL_BASED_OUTPATIENT_CLINIC_OR_DEPARTMENT_OTHER)
Admission: RE | Admit: 2021-07-25 | Discharge: 2021-07-25 | Disposition: A | Discharge status: Home / Self Care | Payer: Medicare Other | Source: Ambulatory Visit | Attending: Family Medicine | Admitting: Family Medicine

## 2021-07-25 VITALS — BP 124/68 | HR 87 | Temp 98.0°F | Resp 16 | Ht 72.0 in | Wt 212.2 lb

## 2021-07-25 DIAGNOSIS — I700 Atherosclerosis of aorta: Secondary | ICD-10-CM | POA: Diagnosis not present

## 2021-07-25 DIAGNOSIS — R0789 Other chest pain: Secondary | ICD-10-CM | POA: Insufficient documentation

## 2021-07-25 MED ORDER — TIZANIDINE HCL 4 MG PO TABS: 4.0000 mg | ORAL_TABLET | Freq: Four times a day (QID) | ORAL | 0 refills | Status: DC | PRN

## 2021-07-25 NOTE — Progress Notes (Signed)
Carelink Summary Report / Loop Recorder 

## 2021-07-25 NOTE — Patient Instructions (Addendum)
Ice/cold pack over area for 10-15 min twice daily.  Heat (pad or rice pillow in microwave) over affected area, 10-15 minutes twice daily.   OK to take Tylenol 1000 mg (2 extra strength tabs) or 975 mg (3 regular strength tabs) every 6 hours as needed.  Let us know if you need anything.  Pectoralis Major Rehab Ask your health care provider which exercises are safe for you. Do exercises exactly as told by your health care provider and adjust them as directed. It is normal to feel mild stretching, pulling, tightness, or discomfort as you do these exercises, but you should stop right away if you feel sudden pain or your pain gets worse. Do not begin these exercises until told by your health care provider. Stretching and range of motion exercises These exercises warm up your muscles and joints and improve the movement and flexibility of your shoulder. These exercises can also help to relieve pain, numbness, and tingling. Exercise A: Pendulum  Stand near a wall or a surface that you can hold onto for balance. Bend at the waist and let your left / right arm hang straight down. Use your other arm to keep your balance. Relax your arm and shoulder muscles, and move your hips and your trunk so your left / right arm swings freely. Your arm should swing because of the motion of your body, not because you are using your arm or shoulder muscles. Keep moving so your arm swings in the following directions, as told by your health care provider: Side to side. Forward and backward. In clockwise and counterclockwise circles. Slowly return to the starting position. Repeat 2 times. Complete this exercise 3 times per week. Exercise B: Abduction, standing Stand and hold a broomstick, a cane, or a similar object. Place your hands a little more than shoulder-width apart on the object. Your left / right hand should be palm-up, and your other hand should be palm-down. While keeping your elbow straight and your shoulder  muscles relaxed, push the stick across your body toward your left / right side. Raise your left / right arm to the side of your body and then over your head until you feel a stretch in your shoulder. Stop when you reach the angle that is recommended by your health care provider. Avoid shrugging your shoulder while you raise your arm. Keep your shoulder blade tucked down toward the middle of your spine. Hold for 10 seconds. Slowly return to the starting position. Repeat 2 times. Complete this exercise 3 times per week. Exercise C: Wand flexion, supine  Lie on your back. You may bend your knees for comfort. Hold a broomstick, a cane, or a similar object so that your hands are about shoulder-width apart on the object. Your palms should face toward your feet. Raise your left / right arm in front of your face, then behind your head (toward the floor). Use your other hand to help you do this. Stop when you feel a gentle stretch in your shoulder, or when you reach the angle that is recommended by your health care provider. Hold for 3 seconds. Use the broomstick and your other arm to help you return your left / right arm to the starting position. Repeat 2 times. Complete this exercise 3 times per week. Exercise D: Wand shoulder external rotation Stand and hold a broomstick, a cane, or a similar object so your hands are about shoulder-width apart on the object. Start with your arms hanging down, then bend both  elbows to an "L" shape (90 degrees). Keep your left / right elbow at your side. Use your other hand to push the stick so your left / right forearm moves away from your body, out to your side. Keep your left / right elbow bent to 90 degrees and keep it against your side. Stop when you feel a gentle stretch in your shoulder, or when you reach the angle recommended by your health care provider. Hold for 10 seconds. Use the stick to help you return your left / right arm to the starting  position. Repeat 2 times. Complete this exercise 3 times per week. Strengthening exercises These exercises build strength and endurance in your shoulder. Endurance is the ability to use your muscles for a long time, even after your muscles get tired. Exercise E: Scapular protraction, standing Stand so you are facing a wall. Place your feet about one arm-length away from the wall. Place your hands on the wall and straighten your elbows. Keep your hands on the wall as you push your upper back away from the wall. You should feel your shoulder blades sliding forward. Keep your elbows and your head still. If you are not sure that you are doing this exercise correctly, ask your health care provider for more instructions. Hold for 3 seconds. Slowly return to the starting position. Let your muscles relax completely before you repeat this exercise. Repeat 2 times. Complete this exercise 3 times per week. Exercise F: Shoulder blade squeezes  (scapular retraction) Sit with good posture in a stable chair. Do not let your back touch the back of the chair. Your arms should be at your sides with your elbows bent. You may rest your forearms on a pillow if that is more comfortable. Squeeze your shoulder blades together. Bring them down and back. Keep your shoulders level. Do not lift your shoulders up toward your ears. Hold for 3 seconds. Return to the starting position. Repeat 2 times. Complete this exercise 3 times per week. This information is not intended to replace advice given to you by your health care provider. Make sure you discuss any questions you have with your health care provider. Document Released: 06/29/2005 Document Revised: 04/09/2016 Document Reviewed: 03/17/2015 Elsevier Interactive Patient Education  [employer].

## 2021-07-25 NOTE — Progress Notes (Signed)
Musculoskeletal Exam  Patient: Lawrence Rivas DOB: 1950-09-09  DOS: 07/25/2021  SUBJECTIVE:  Chief Complaint:   Chief Complaint  Patient presents with   Rib Injury   Knee Injury    Lawrence Rivas is a 71 y.o.  male for evaluation and treatment of knee and side pain.   Onset:  11 days ago. [place] while trying to wrangle his cat.  Location: R medial knee and L side anterior chest wall Character:  aching and sharp  Progression of issue:  knee improved, L side of the chest is starting to worsen Associated symptoms: radiating to R side of back; hurts when he takes a deep breath No bruising, redness, swelling Treatment: to date has been rest and OTC NSAIDS.   Neurovascular symptoms: no  Past Medical History:  Diagnosis Date   Adenomatous colon polyp    TA and SS   Erectile dysfunction    Numbness and tingling    right arm    Paroxysmal A-fib (HCC)    no recurrence s/p atrial flutter ablation (previously atrial flutter felt to degenerate into afib)   Psoriasis    TIA (transient ischemic attack) 2004    Objective: VITAL SIGNS: BP 124/68    Pulse 87    Temp 98 F (36.7 C)    Resp 16    Ht 6' (1.829 m)    Wt 212 lb 3.2 oz (96.3 kg)    SpO2 94%    BMI 28.78 kg/m  Constitutional: Well formed, well developed. No acute distress. Thorax & Lungs: No accessory muscle use Musculoskeletal: Chest wall.   Tenderness to palpation: yes over ant axillary line near R5-7 Deformity: no Ecchymosis: no Crepitus: No Neurologic: Normal sensory function. No focal deficits noted. DTR's equal and symmetric in LE's. No clonus. Psychiatric: Normal mood. Age appropriate judgment and insight. Alert & oriented x 3.    Assessment:  Chest wall pain - Plan: tiZANidine (ZANAFLEX) 4 MG tablet, DG Ribs Unilateral W/Chest Left  Plan: Ck CXR, discussed management of rx and contusions. Stretches/exercises, heat, ice, Tylenol. Zanaflex prn.  F/u prn. The patient voiced understanding and agreement to  the plan.   [place], DO 07/25/21  11:28 AM

## 2021-08-14 ENCOUNTER — Other Ambulatory Visit: Payer: Self-pay

## 2021-08-14 ENCOUNTER — Ambulatory Visit (AMBULATORY_SURGERY_CENTER): Payer: Medicare Other | Admitting: *Deleted

## 2021-08-14 VITALS — Ht 72.0 in | Wt 210.0 lb

## 2021-08-14 DIAGNOSIS — Z86010 Personal history of colonic polyps: Secondary | ICD-10-CM

## 2021-08-14 MED ORDER — PLENVU 140 G PO SOLR: 1.0000 | ORAL | 0 refills | Status: DC

## 2021-08-14 NOTE — Progress Notes (Signed)
No egg or soy allergy known to patient  No issues known to pt with past sedation with any surgeries or procedures Patient denies ever being told they had issues or difficulty with intubation  No FH of Malignant Hyperthermia Pt is not on diet pills Pt is not on  home 02  Pt is not on blood thinners  Pt denies issues with constipation  No A fib or A flutter  Pt is fully vaccinated  for Covid   Plenvu Coupon to pt in PV today , Code to Pharmacy and  NO PA's for preps discussed with pt In PV today  Discussed with pt there will be an out-of-pocket cost for prep and that varies from $0 to 70 +  dollars - pt verbalized understanding   Due to the COVID-19 pandemic we are asking patients to follow certain guidelines in PV and the [facility]   Pt aware of COVID protocols and [facility] guidelines   PV completed over the phone. Pt verified name, DOB, address and insurance during PV today.  Pt mailed instruction packet with copy of consent form to read and not return, and instructions.  Pt encouraged to call with questions or issues.  If pt has My chart, procedure instructions sent via My Chart

## 2021-08-17 IMAGING — DX DG CHEST 2V
2 series · 2 of 2 positions shown · non-contrast
Comparison: 06/28/2018

CLINICAL DATA: Fatigue

EXAM:
CHEST - 2 VIEW

[chest pa]
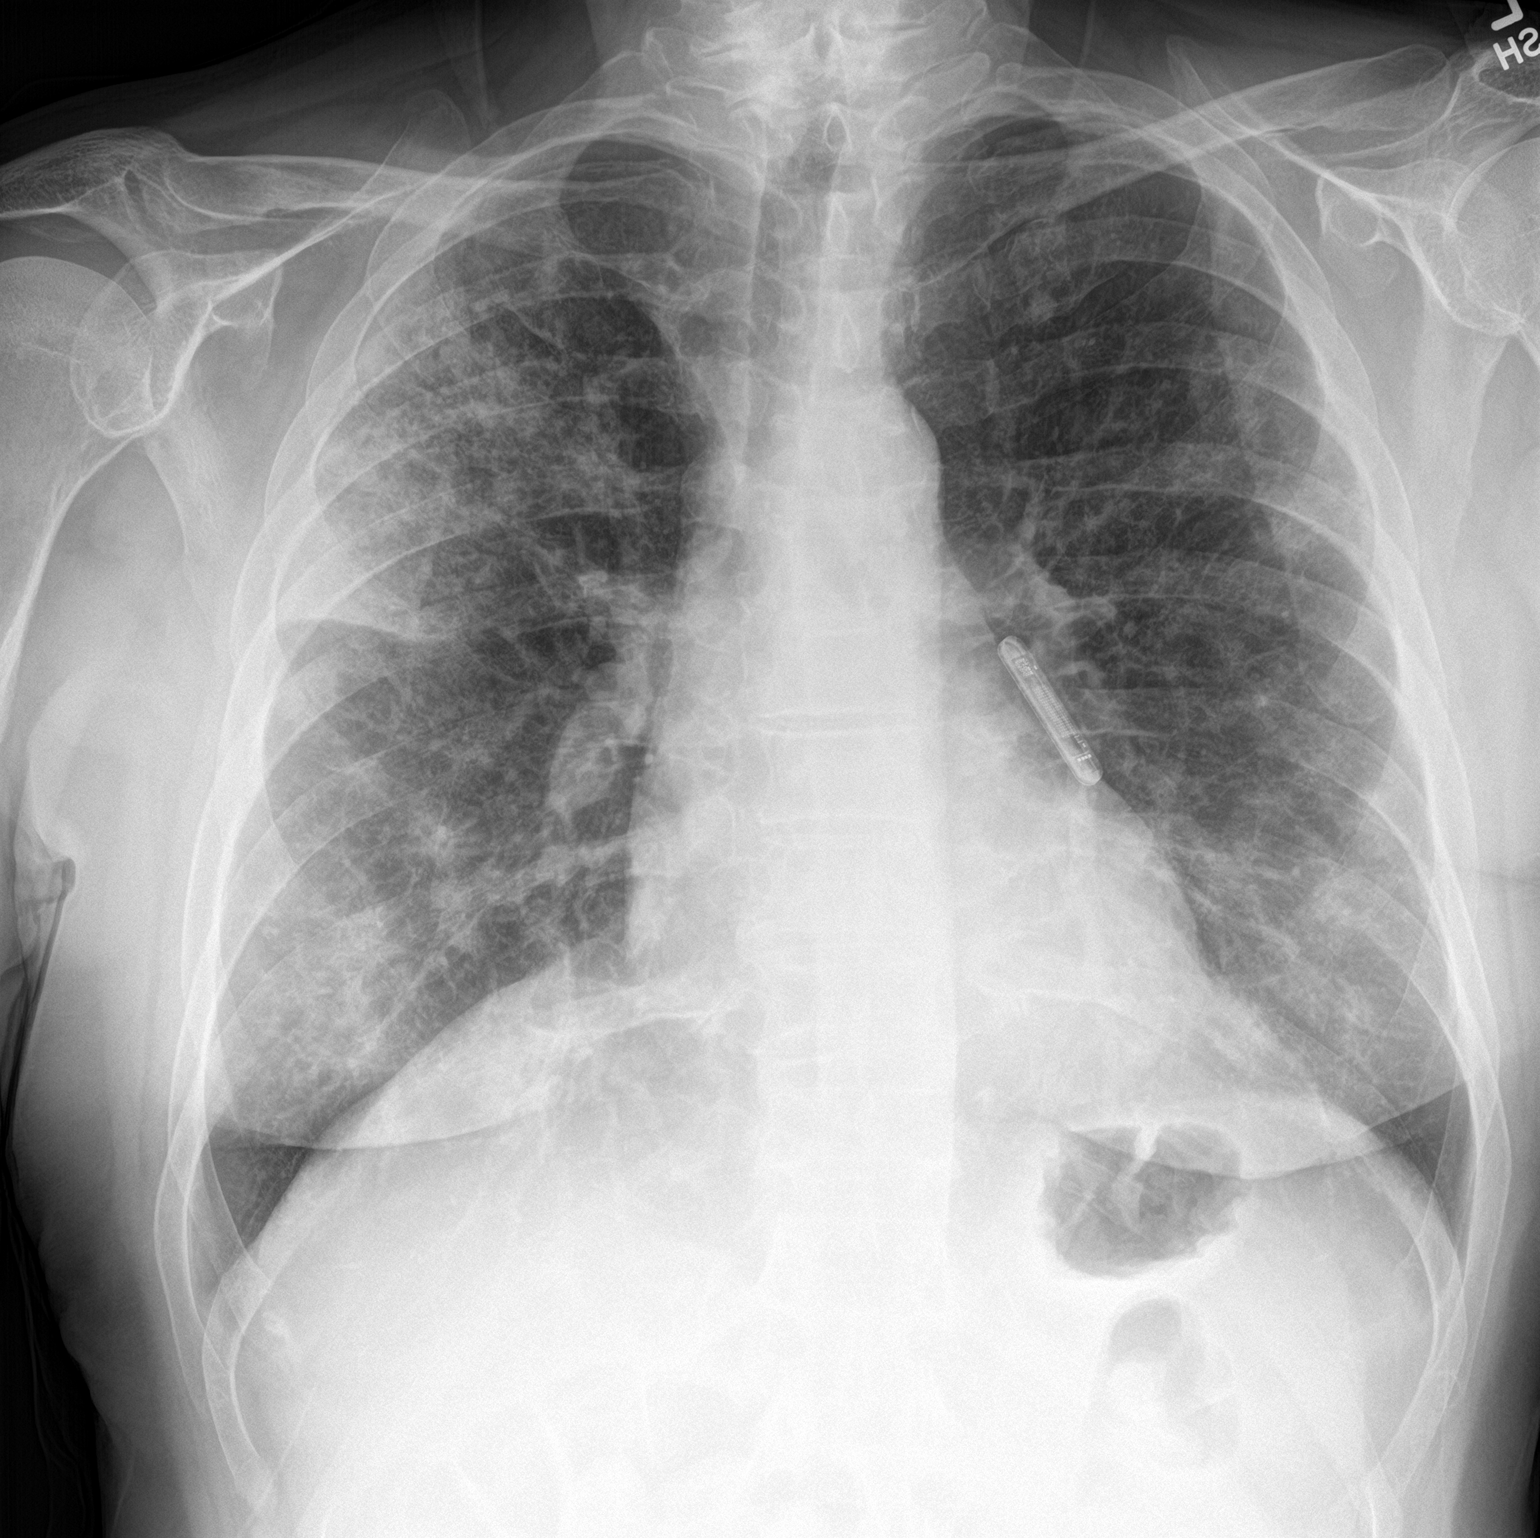

[chest lat]
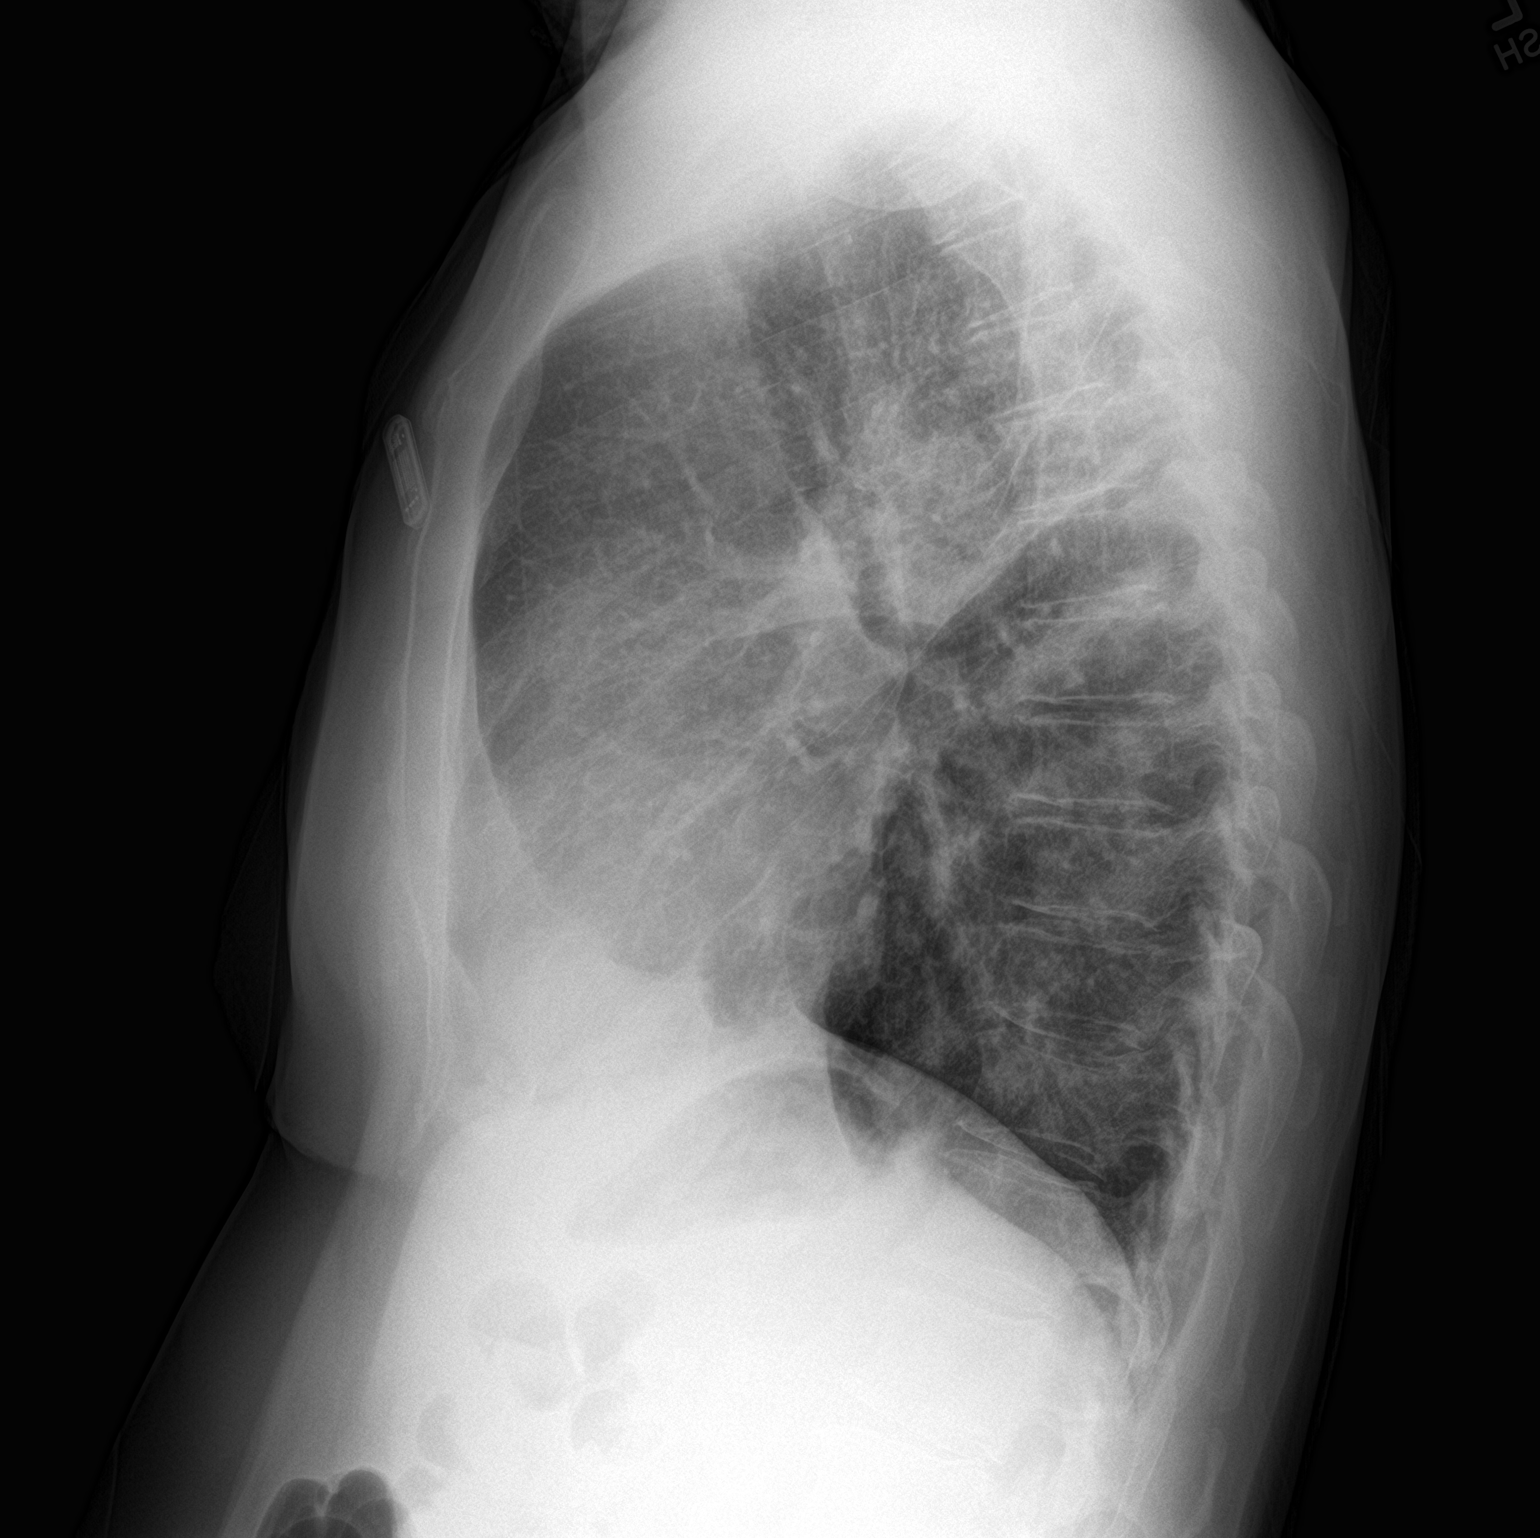

[2 of 2 positions shown; findings below may reference images not displayed]

FINDINGS: Patchy bilateral interstitial and alveolar airspace opacities most
severe in the right upper lobe and right lower lobe concerning for
multilobar pneumonia including atypical viral pneumonia such as
COVID 19. No pleural effusion or pneumothorax. Heart and mediastinal
contours are unremarkable.

No acute osseous abnormality.
IMPRESSION: Patchy bilateral interstitial and alveolar airspace opacities most
severe in the right upper lobe and right lower lobe concerning for
multilobar pneumonia including atypical viral pneumonia such as
COVID 19.

## 2021-08-18 ENCOUNTER — Telehealth: Payer: Self-pay | Admitting: Gastroenterology

## 2021-08-18 ENCOUNTER — Ambulatory Visit (INDEPENDENT_AMBULATORY_CARE_PROVIDER_SITE_OTHER)
Admission: RE | Admit: 2021-08-18 | Discharge: 2021-08-18 | Disposition: A | Discharge status: Home / Self Care | Payer: Medicare Other | Source: Ambulatory Visit | Attending: Pulmonary Disease | Admitting: Pulmonary Disease

## 2021-08-18 ENCOUNTER — Ambulatory Visit (INDEPENDENT_AMBULATORY_CARE_PROVIDER_SITE_OTHER): Payer: Medicare Other

## 2021-08-18 ENCOUNTER — Other Ambulatory Visit: Payer: Self-pay

## 2021-08-18 DIAGNOSIS — J849 Interstitial pulmonary disease, unspecified: Secondary | ICD-10-CM | POA: Diagnosis not present

## 2021-08-18 DIAGNOSIS — I2510 Atherosclerotic heart disease of native coronary artery without angina pectoris: Secondary | ICD-10-CM | POA: Diagnosis not present

## 2021-08-18 DIAGNOSIS — J479 Bronchiectasis, uncomplicated: Secondary | ICD-10-CM | POA: Diagnosis not present

## 2021-08-18 DIAGNOSIS — B349 Viral infection, unspecified: Secondary | ICD-10-CM | POA: Diagnosis not present

## 2021-08-18 DIAGNOSIS — Z8701 Personal history of pneumonia (recurrent): Secondary | ICD-10-CM | POA: Diagnosis not present

## 2021-08-18 DIAGNOSIS — I4891 Unspecified atrial fibrillation: Secondary | ICD-10-CM

## 2021-08-18 LAB — CUP PACEART REMOTE DEVICE CHECK
Date Time Interrogation Session: 20230205230448
Implantable Pulse Generator Implant Date: 20210301

## 2021-08-18 NOTE — Telephone Encounter (Signed)
Patient called and stated that he wants to know if he in the future will need a prior authorization for his prep medication. Stated that his insurance did not help pay and they seemed like they weren't working with him. Seeking advice, please advise.

## 2021-08-18 NOTE — Telephone Encounter (Signed)
Spoke with the patient. He has concerns about insurance covering cost of procedure. Encouraged patient to call his insurance provider about his coverage. Pt verbalizes understanding.

## 2021-08-19 ENCOUNTER — Telehealth: Payer: Self-pay | Admitting: Pulmonary Disease

## 2021-08-19 NOTE — Telephone Encounter (Signed)
Nothing needed immediately, route to Dr Halford Chessman for his review on return

## 2021-08-19 NOTE — Telephone Encounter (Signed)
Received call report from [facility] with [place] radiology on CT.   Dr. Melvyn Novas, please advise. Dr. Halford Chessman is unavailable.

## 2021-08-19 NOTE — Telephone Encounter (Signed)
Routing to Dr. Halford Chessman to review.

## 2021-08-20 ENCOUNTER — Other Ambulatory Visit: Payer: Self-pay

## 2021-08-20 ENCOUNTER — Encounter: Payer: Self-pay | Admitting: Dermatology

## 2021-08-20 ENCOUNTER — Ambulatory Visit: Payer: Medicare Other | Admitting: Dermatology

## 2021-08-20 DIAGNOSIS — D1801 Hemangioma of skin and subcutaneous tissue: Secondary | ICD-10-CM | POA: Diagnosis not present

## 2021-08-20 DIAGNOSIS — Z1283 Encounter for screening for malignant neoplasm of skin: Secondary | ICD-10-CM

## 2021-08-20 DIAGNOSIS — L851 Acquired keratosis [keratoderma] palmaris et plantaris: Secondary | ICD-10-CM | POA: Diagnosis not present

## 2021-08-20 DIAGNOSIS — L918 Other hypertrophic disorders of the skin: Secondary | ICD-10-CM | POA: Diagnosis not present

## 2021-08-20 DIAGNOSIS — I872 Venous insufficiency (chronic) (peripheral): Secondary | ICD-10-CM

## 2021-08-20 MED ORDER — CLOBETASOL PROPIONATE 0.05 % EX OINT: TOPICAL_OINTMENT | CUTANEOUS | 1 refills | Status: DC

## 2021-08-20 NOTE — Telephone Encounter (Signed)
Patient to be scheduled for ROV with me or NP to discuss findings.

## 2021-08-20 NOTE — Patient Instructions (Signed)
Discussed Dermend for fragile skin forearms

## 2021-08-21 NOTE — Progress Notes (Signed)
Carelink Summary Report / Loop Recorder 

## 2021-08-26 ENCOUNTER — Encounter: Payer: Self-pay | Admitting: Gastroenterology

## 2021-08-28 ENCOUNTER — Encounter: Payer: Self-pay | Admitting: Gastroenterology

## 2021-08-28 ENCOUNTER — Ambulatory Visit (AMBULATORY_SURGERY_CENTER): Payer: Medicare Other | Admitting: Gastroenterology

## 2021-08-28 ENCOUNTER — Other Ambulatory Visit: Payer: Self-pay

## 2021-08-28 VITALS — BP 107/50 | HR 62 | Temp 97.8°F | Resp 13 | Ht 72.0 in | Wt 210.0 lb

## 2021-08-28 DIAGNOSIS — D123 Benign neoplasm of transverse colon: Secondary | ICD-10-CM | POA: Diagnosis not present

## 2021-08-28 DIAGNOSIS — Z86010 Personal history of colonic polyps: Secondary | ICD-10-CM | POA: Diagnosis not present

## 2021-08-28 MED ORDER — SODIUM CHLORIDE 0.9 % IV SOLN: 500.0000 mL | Freq: Once | INTRAVENOUS | Status: DC

## 2021-08-28 NOTE — Op Note (Signed)
[name] Patient Name: Lawrence Rivas Procedure Date: 08/28/2021 8:31 AM MRN: [id number] Endoscopist: Haralson. Lawrence Rivas , MD Age: 71 Referring MD:  Date of Birth: 05/02/1951 Gender: Male Account #: [id number] Procedure:                Colonoscopy Indications:              Surveillance: History of numerous (> 10)                            adenomas/SSPs on last colonoscopy (< 3 yrs)                           49 TA and SSP last colonoscopy Aug 2021 Medicines:                Monitored Anesthesia Care Procedure:                Pre-Anesthesia Assessment:                           - Prior to the procedure, a History and Physical                            was performed, and patient medications and                            allergies were reviewed. The patient's tolerance of                            previous anesthesia was also reviewed. The risks                            and benefits of the procedure and the sedation                            options and risks were discussed with the patient.                            All questions were answered, and informed consent                            was obtained. Prior Anticoagulants: The patient has                            taken no previous anticoagulant or antiplatelet                            agents. ASA Grade Assessment: III - A patient with                            severe systemic disease. After reviewing the risks                            and benefits, the patient was deemed in  satisfactory condition to undergo the procedure.                           After obtaining informed consent, the colonoscope                            was passed under direct vision. Throughout the                            procedure, the patient's blood pressure, pulse, and                            oxygen saturations were monitored continuously. The                            Olympus CF-[id number])  Colonoscope was                            introduced through the anus and advanced to the the                            cecum, identified by appendiceal orifice and                            ileocecal valve. The colonoscopy was somewhat                            difficult due to a redundant colon. The patient                            tolerated the procedure well. The quality of the                            bowel preparation was excellent. The ileocecal                            valve, appendiceal orifice, and rectum were                            photographed. Scope In: 8:38:36 AM Scope Out: 9:11:33 AM Scope Withdrawal Time: 0 hours 30 minutes 16 seconds  Total Procedure Duration: 0 hours 32 minutes 57 seconds  Findings:                 The perianal and digital rectal examinations were                            normal.                           Repeat examination of right colon under NBI                            performed.  A 8 mm polyp was found in the transverse colon. The                            polyp was sessile. The polyp was removed with a                            cold snare. Resection and retrieval were complete.                           A tattoo was seen in the distal transverse colon.                            The tattoo site appeared normal.                           Multiple small-mouthed diverticula were found in                            the sigmoid colon, descending colon and transverse                            colon.                           Internal hemorrhoids were found. The hemorrhoids                            were small.                           The exam was otherwise without abnormality on                            direct and retroflexion views. Complications:            No immediate complications. Estimated Blood Loss:     Estimated blood loss was minimal. Impression:               - One 8 mm polyp in the  transverse colon, removed                            with a cold snare. Resected and retrieved.                           - A tattoo was seen in the distal transverse colon.                            The tattoo site appeared normal.                           - Diverticulosis in the sigmoid colon, in the                            descending colon and in the transverse colon.                           -  Internal hemorrhoids.                           - The examination was otherwise normal on direct                            and retroflexion views. Recommendation:           - Patient has a contact number available for                            emergencies. The signs and symptoms of potential                            delayed complications were discussed with the                            patient. Return to normal activities tomorrow.                            Written discharge instructions were provided to the                            patient.                           - Resume previous diet.                           - Continue present medications.                           - Await pathology results.                           - Repeat colonoscopy in 3 years for surveillance. [name] Lawrence Carrow, MD 08/28/2021 9:17:04 AM This report has been signed electronically.

## 2021-08-28 NOTE — Patient Instructions (Signed)
Thank you for letting us take care of your healthcare needs today. Please see handouts given to you on Polyps, Diverticulosis and Hemorrhoids.    YOU HAD AN ENDOSCOPIC PROCEDURE TODAY AT [facility]:   Refer to the procedure report that was given to you for any specific questions about what was found during the examination.  If the procedure report does not answer your questions, please call your gastroenterologist to clarify.  If you requested that your care partner not be given the details of your procedure findings, then the procedure report has been included in a sealed envelope for you to review at your convenience later.  YOU SHOULD EXPECT: Some feelings of bloating in the abdomen. Passage of more gas than usual.  Walking can help get rid of the air that was put into your GI tract during the procedure and reduce the bloating. If you had a lower endoscopy (such as a colonoscopy or flexible sigmoidoscopy) you may notice spotting of blood in your stool or on the toilet paper. If you underwent a bowel prep for your procedure, you may not have a normal bowel movement for a few days.  Please Note:  You might notice some irritation and congestion in your nose or some drainage.  This is from the oxygen used during your procedure.  There is no need for concern and it should clear up in a day or so.  SYMPTOMS TO REPORT IMMEDIATELY:  Following lower endoscopy (colonoscopy or flexible sigmoidoscopy):  Excessive amounts of blood in the stool  Significant tenderness or worsening of abdominal pains  Swelling of the abdomen that is new, acute  Fever of 100F or higher  For urgent or emergent issues, a gastroenterologist can be reached at any hour by calling [phone]. Do not use MyChart messaging for urgent concerns.    DIET:  We do recommend a small meal at first, but then you may proceed to your regular diet.  Drink plenty of fluids but you should avoid alcoholic beverages for 24  hours.  ACTIVITY:  You should plan to take it easy for the rest of today and you should NOT DRIVE or use heavy machinery until tomorrow (because of the sedation medicines used during the test).    FOLLOW UP: Our staff will call the number listed on your records 48-72 hours following your procedure to check on you and address any questions or concerns that you may have regarding the information given to you following your procedure. If we do not reach you, we will leave a message.  We will attempt to reach you two times.  During this call, we will ask if you have developed any symptoms of COVID 19. If you develop any symptoms (ie: fever, flu-like symptoms, shortness of breath, cough etc.) before then, please call [phone].  If you test positive for Covid 19 in the 2 weeks post procedure, please call and report this information to [place].    If any biopsies were taken you will be contacted by phone or by letter within the next 1-3 weeks.  Please call us at [phone] if you have not heard about the biopsies in 3 weeks.    SIGNATURES/CONFIDENTIALITY: You and/or your care partner have signed paperwork which will be entered into your electronic medical record.  These signatures attest to the fact that that the information above on your After Visit Summary has been reviewed and is understood.  Full responsibility of the confidentiality of this discharge information  lies with you and/or your care-partner.

## 2021-08-28 NOTE — Progress Notes (Signed)
Called to room to assist during endoscopic procedure.  Patient ID and intended procedure confirmed with present staff. Received instructions for my participation in the procedure from the performing physician.  

## 2021-08-28 NOTE — Progress Notes (Deleted)
Office Visit Note  Patient: Lawrence Rivas             Date of Birth: 03-28-1951           MRN: [id number]             PCP: Wanda Plump, MD Referring: Wanda Plump, MD Visit Date: 09/10/2021 Occupation: @GUAROCC @  Subjective:  No chief complaint on file.   History of Present Illness: Lawrence Rivas is a 71 y.o. male ***   Activities of Daily Living:  Patient reports morning stiffness for *** {minute/hour:19697}.   Patient {ACTIONS;DENIES/REPORTS:21021675::"Denies"} nocturnal pain.  Difficulty dressing/grooming: {ACTIONS;DENIES/REPORTS:21021675::"Denies"} Difficulty climbing stairs: {ACTIONS;DENIES/REPORTS:21021675::"Denies"} Difficulty getting out of chair: {ACTIONS;DENIES/REPORTS:21021675::"Denies"} Difficulty using hands for taps, buttons, cutlery, and/or writing: {ACTIONS;DENIES/REPORTS:21021675::"Denies"}  No Rheumatology ROS completed.   PMFS History:  Patient Active Problem List   Diagnosis Date Noted   Aortic atherosclerosis (HCC) 04/17/2021   Neck pain 04/16/2021   Chronic midline low back pain without sciatica 04/16/2021   Chronic pain of right knee 04/16/2021   Abnormal CT scan, chest 02/12/2021   Pneumonia 02/12/2021   Psoriasis 02/12/2021   Emphysema lung (HCC) 02/12/2021   PCP NOTESS>>>>>>>>>>>>.. 04/02/2020   Secondary hypercoagulable state (HCC) 11/27/2019   Palpitations 09/11/2019   Allergic rhinitis 10/24/2012   Annual physical exam 06/18/2011   Erectile dysfunction 06/18/2011   Atrial fibrillation (HCC) 03/18/2011   Tobacco abuse 03/18/2011    Past Medical History:  Diagnosis Date   Adenomatous colon polyp    TA and SS   COPD (chronic obstructive pulmonary disease) (HCC)    Erectile dysfunction    Numbness and tingling    right arm    Paroxysmal A-fib (HCC)    no recurrence s/p atrial flutter ablation (previously atrial flutter felt to degenerate into afib)   Psoriasis    TIA (transient ischemic attack) 2004    Family History   Problem Relation Age of Onset   Stroke Mother        in her 43-90s   Uterine cancer Mother    Coronary artery disease Father 24   Diabetes Maternal Grandmother    Healthy Son    Colon cancer Neg Hx    Prostate cancer Neg Hx    Colon polyps Neg Hx    Esophageal cancer Neg Hx    Rectal cancer Neg Hx    Stomach cancer Neg Hx    Past Surgical History:  Procedure Laterality Date   A FLUTTER ABLATION     March 2011 at [facility] by Dr Ruben Reason Dixat   ATRIAL FIBRILLATION ABLATION N/A 11/07/2019   Procedure: ATRIAL FIBRILLATION ABLATION;  Surgeon: Hillis Range, MD;  Location: [facility];  Service: Cardiovascular;  Laterality: N/A;   implantable loop recorder insertion  09/11/2019   Medtronic Reveal Linq model LNQ 22 (SN [id number]) implantable loop recorder implanted by Dr Johney Frame for afib management and evaluation of palpitations   TONSILLECTOMY     TOTAL HIP ARTHROPLASTY     right   Social History   Social History Narrative   Household: pt and wife     Immunization History  Administered Date(s) Administered   Fluad Quad(high Dose 65+) 04/01/2020, 04/28/2021   Influenza Split 05/01/2014   Influenza,inj,Quad PF,6+ Mos 06/05/2013   Influenza,inj,quad, With Preservative 04/13/2019   Influenza-Unspecified 05/17/2016   PFIZER(Purple Top)SARS-COV-2 Vaccination 07/29/2019, 08/19/2019, 10/22/2020   Pneumococcal Conjugate-13 04/01/2020   Pneumococcal Polysaccharide-23 06/18/2011   Tdap 06/18/2011   Zoster, Live 06/05/2013  Objective: Vital Signs: There were no vitals taken for this visit.   Physical Exam   Musculoskeletal Exam: ***  CDAI Exam: CDAI Score: -- Patient Global: --; Provider Global: -- Swollen: --; Tender: -- Joint Exam 09/10/2021   No joint exam has been documented for this visit   There is currently no information documented on the homunculus. Go to the Rheumatology activity and complete the homunculus joint exam.  Investigation: No  additional findings.  Imaging: CT Chest High Resolution  Result Date: 08/18/2021 CLINICAL DATA:  Follow-up lung opacities. History of viral pneumonia in spring 2022. EXAM: CT CHEST WITHOUT CONTRAST TECHNIQUE: Multidetector CT imaging of the chest was performed following the standard protocol without intravenous contrast. High resolution imaging of the lungs, as well as inspiratory and expiratory imaging, was performed. RADIATION DOSE REDUCTION: This exam was performed according to the departmental dose-optimization program which includes automated exposure control, adjustment of the mA and/or kV according to patient size and/or use of iterative reconstruction technique. COMPARISON:  02/03/2021 chest CT. FINDINGS: Cardiovascular: Normal heart size. No significant pericardial effusion/thickening. Three-vessel coronary atherosclerosis. Atherosclerotic nonaneurysmal thoracic aorta. Normal caliber pulmonary arteries. Mediastinum/Nodes: No discrete thyroid nodules. Unremarkable esophagus. No pathologically enlarged axillary lymph nodes. Mildly enlarged 1.1 cm subcarinal node (series 2/image 60), stable. No additional pathologically enlarged mediastinal nodes. No discrete hilar adenopathy on these noncontrast images. Lungs/Pleura: No pneumothorax. No pleural effusion. Previously visualized extensive bandlike regions of consolidation and ground-glass opacity throughout both lungs on 02/03/2021 chest CT have substantially decreased with residual mild patchy ground-glass opacities and reticulation in these locations. New thick curvilinear bandlike focus of consolidation in the lingula (series 3/image 74). New irregular nodular focus of consolidation in the anterior basilar right lower lobe measuring 2.4 x 1.5 cm (series 3/image 109). New small subpleural solid 0.4 cm pulmonary nodule in the posterior basilar right lower lobe (series 3/image 122). Some of the mild patchy regions of ground-glass opacity on today's scan  appear new in location since 02/03/2021 chest CT, for example in the right upper lobe (series 3/image 53) and in posterior peripheral left upper lobe (series 3/image 35). Scattered regions of mild cylindrical bronchiectasis in both lungs, overall improved from prior CT. No frank honeycombing. No clear apicobasilar gradient to these findings. No significant lobular air trapping or evidence of tracheobronchomalacia on the expiration sequence. Upper abdomen: No acute abnormality. Musculoskeletal: No aggressive appearing focal osseous lesions. Subcutaneous loop recorder in the ventral upper left chest wall. Mild thoracic spondylosis. IMPRESSION: 1. Waxing and waning patchy regions of consolidation and ground-glass opacity throughout both lungs as detailed. The leading differential considerations include cryptogenic organizing pneumonia versus recurrent multilobar infection. Suggest continued high-resolution chest CT surveillance. 2. One of the new foci of consolidation on today's scan in the right lower lobe demonstrates an aggressive nodular morphology and is indeterminate for infectious/inflammatory versus malignant etiology in this high risk patient. Suggest follow-up chest CT in 3 months. Alternatively, bronchoscopic evaluation or PET-CT could be considered at this time, as clinically warranted. 3. Stable mild mediastinal lymphadenopathy, most suggestive of reactive adenopathy. 4. Three-vessel coronary atherosclerosis. 5. Aortic Atherosclerosis (ICD10-I70.0). These results will be called to the ordering clinician or representative by the Radiologist Assistant, and communication documented in the PACS or [employer]. Electronically Signed   By: Delbert Phenix M.D.   On: 08/18/2021 17:15   CUP PACEART REMOTE DEVICE CHECK  Result Date: 08/18/2021 ILR summary report received. Battery status OK. Normal device function. No new symptom, tachy, brady, or pause episodes.  No new AF episodes. Monthly summary reports and  ROV/PRN LA   Recent Labs: Lab Results  Component Value Date   WBC 7.6 05/13/2021   HGB 13.8 05/13/2021   PLT 354 05/13/2021   NA 138 05/13/2021   K 4.6 05/13/2021   CL 100 05/13/2021   CO2 29 05/13/2021   GLUCOSE 93 05/13/2021   BUN 14 05/13/2021   CREATININE 0.66 (L) 05/13/2021   BILITOT 0.4 05/13/2021   ALKPHOS 73 01/21/2021   AST 20 05/13/2021   ALT 17 05/13/2021   PROT 7.0 05/13/2021   ALBUMIN 2.8 (L) 01/21/2021   CALCIUM 9.1 05/13/2021   GFRAA 101 10/31/2019    Speciality Comments: No specialty comments available.  Procedures:  No procedures performed Allergies: Watermelon [citrullus vulgaris] and Coconut oil   Assessment / Plan:     Visit Diagnoses: No diagnosis found.  Orders: No orders of the defined types were placed in this encounter.  No orders of the defined types were placed in this encounter.   Face-to-face time spent with patient was *** minutes. Greater than 50% of time was spent in counseling and coordination of care.  Follow-Up Instructions: No follow-ups on file.   Ellen Henri, CMA  Note - This record has been created using Animal nutritionist.  Chart creation errors have been sought, but may not always  have been located. Such creation errors do not reflect on  the standard of medical care.

## 2021-08-28 NOTE — Progress Notes (Signed)
To PACU, VSS. Report to [email or web address] 

## 2021-08-28 NOTE — Progress Notes (Signed)
History and Physical:  This patient presents for endoscopic testing for: Encounter Diagnosis  Name Primary?   Personal history of colonic polyps Yes    Clinical Hx in 07/15/21 office note. 14 TA and SSP Aug 2021  ROS: Patient denies chest pain or shortness of breath   Past Medical History: Past Medical History:  Diagnosis Date   Adenomatous colon polyp    TA and SS   COPD (chronic obstructive pulmonary disease) (HCC)    Erectile dysfunction    Numbness and tingling    right arm    Paroxysmal A-fib (HCC)    no recurrence s/p atrial flutter ablation (previously atrial flutter felt to degenerate into afib)   Psoriasis    TIA (transient ischemic attack) 2004     Past Surgical History: Past Surgical History:  Procedure Laterality Date   A FLUTTER ABLATION     March 2011 at [facility] by Dr Lyanne Co Dixat   ATRIAL FIBRILLATION ABLATION N/A 11/07/2019   Procedure: [place];  Surgeon: Thompson Grayer, MD;  Location: [place] [facility];  Service: Cardiovascular;  Laterality: N/A;   implantable loop recorder insertion  09/11/2019   Medtronic Reveal Linq model LNQ 22 (SN [id number]) implantable loop recorder implanted by Dr Rayann Heman for afib management and evaluation of palpitations   TONSILLECTOMY     TOTAL HIP ARTHROPLASTY     right    Allergies: Allergies  Allergen Reactions   Watermelon [Citrullus Vulgaris] Itching    Any melon   Coconut Oil Hives    Outpatient Meds: Current Outpatient Medications  Medication Sig Dispense Refill   aspirin 325 MG tablet aspirin 325 mg tablet  Take 1 tablet every day by oral route.     clobetasol ointment (TEMOVATE) 0.05 % APPLY TO AFFECTED AREA AFTER BATHING. (GENERIC PLEASE) 60 g 1   diltiazem (CARDIZEM CD) 120 MG 24 hr capsule TAKE 1 CAPSULE(120 MG) BY MOUTH DAILY 90 capsule 3   Multiple Vitamin (MULTIVITAMIN PO) Take by mouth.     Tiotropium Bromide-Olodaterol (STIOLTO RESPIMAT) 2.5-2.5 MCG/ACT AERS Inhale 2  puffs into the lungs daily. 1 each 5   tiZANidine (ZANAFLEX) 4 MG tablet Take 1 tablet (4 mg total) by mouth every 6 (six) hours as needed for muscle spasms. (Patient not taking: Reported on 08/20/2021) 30 tablet 0   Current Facility-Administered Medications  Medication Dose Route Frequency Provider Last Rate Last Admin   0.9 %  sodium chloride infusion  500 mL Intravenous Once Doran Stabler, MD          ___________________________________________________________________ Objective   Exam:  BP 112/63    Pulse 76    Temp 97.8 F (36.6 C) (Temporal)    Ht 6' (1.829 m)    Wt 210 lb (95.3 kg)    SpO2 98%    BMI 28.48 kg/m   CV: RRR without murmur, S1/S2 Resp: clear to auscultation bilaterally, normal RR and effort noted GI: soft, no tenderness, with active bowel sounds.   Assessment: Encounter Diagnosis  Name Primary?   Personal history of colonic polyps Yes     Plan: Colonoscopy  The benefits and risks of the planned procedure were described in detail with the patient or (when appropriate) their health care proxy.  Risks were outlined as including, but not limited to, bleeding, infection, perforation, adverse medication reaction leading to cardiac or pulmonary decompensation, pancreatitis (if ERCP).  The limitation of incomplete mucosal visualization was also discussed.  No guarantees or warranties were  given.    The patient is appropriate for an endoscopic procedure in the ambulatory setting.   - Wilfrid Lund, MD

## 2021-08-28 NOTE — Progress Notes (Signed)
Pt's states no medical or surgical changes since previsit or office visit.  VS CW  

## 2021-09-01 ENCOUNTER — Encounter: Payer: Self-pay | Admitting: Nurse Practitioner

## 2021-09-01 ENCOUNTER — Other Ambulatory Visit: Payer: Self-pay

## 2021-09-01 ENCOUNTER — Ambulatory Visit: Payer: Medicare Other | Admitting: Nurse Practitioner

## 2021-09-01 VITALS — BP 128/80 | HR 75 | Temp 98.2°F | Ht 72.0 in | Wt 213.2 lb

## 2021-09-01 DIAGNOSIS — Z720 Tobacco use: Secondary | ICD-10-CM

## 2021-09-01 DIAGNOSIS — J432 Centrilobular emphysema: Secondary | ICD-10-CM | POA: Diagnosis not present

## 2021-09-01 DIAGNOSIS — J302 Other seasonal allergic rhinitis: Secondary | ICD-10-CM

## 2021-09-01 DIAGNOSIS — L409 Psoriasis, unspecified: Secondary | ICD-10-CM

## 2021-09-01 DIAGNOSIS — R911 Solitary pulmonary nodule: Secondary | ICD-10-CM | POA: Diagnosis not present

## 2021-09-01 DIAGNOSIS — J849 Interstitial pulmonary disease, unspecified: Secondary | ICD-10-CM | POA: Insufficient documentation

## 2021-09-01 MED ORDER — STIOLTO RESPIMAT 2.5-2.5 MCG/ACT IN AERS: 2.0000 | INHALATION_SPRAY | Freq: Every day | RESPIRATORY_TRACT | 5 refills | Status: DC

## 2021-09-01 NOTE — Progress Notes (Signed)
Reviewed and agree with assessment/plan.   Chesley Mires, MD [facility] Pulmonary/Critical Care 09/01/2021, 5:06 PM Pager:  [phone]

## 2021-09-01 NOTE — Assessment & Plan Note (Signed)
Waxing and waning ground glass opacities. BTX has improved. New pulmonary nodules. Concern for inflammatory vs malignant etiology. Breathing has improved and no new infectious symptoms reported.

## 2021-09-01 NOTE — Assessment & Plan Note (Addendum)
Followed by rheumatology. Concern for psoriatic arthritis vs osteoarthritis. No evidence of psoriatic plaques or other systemic findings on most recent exam. ESR elevated; otherwise unremarkable lab findings.

## 2021-09-01 NOTE — Progress Notes (Signed)
'@Patient'  ID: Lawrence Rivas, male    DOB: 29-Mar-1951, 71 y.o.   MRN: [id number]  Chief Complaint  Patient presents with   Follow-up    Patient is here for CT results.     Referring provider: Colon Branch, MD  HPI: 71 year old male, current every day smoker (25 pack years) followed for emphysema and post viral pneumonitis. He is a patient of Dr. Juanetta Gosling and last seen in office on 05/19/2021. Past medical history significant for a fib, atherosclerosis, allergic rhinitis, psoriasis (never confirmed by bx), tobacco abuse, ED, osteoarthritis.  He is followed by rheumatology and cardiology.  His recent work-up by rheumatology was unremarkable; suspected his joint pains were related to severe osteoarthritis.  TEST/EVENTS:  02/03/2021 CT chest without contrast: Atherosclerosis.  High right paratracheal lymph node measures 10 mm.  Mediastinal lymph nodes are subcentimeter in short axis.  Subcarinal lymph node measures 12 mm.  Centrilobular emphysema.  Patchy areas of parenchymal groundglass consolidation, bronchiectasis and architectural distortion bilaterally 03/19/2021 PFTs: FVC 4.2 (88), FEV1 3.09 (88), ratio 73, TLC 90%, DLCOcor 93%.  No BD. Overall normal spirometry and diffusion capacity 08/18/2021 HRCT chest: Mildly enlarged 1.1 cm subcarinal node, stable.  Previously visualized bandlike regions of consolidation and groundglass opacity throughout both lungs, substantially decreased with some residual patchy groundglass opacities and reticulation in these locations, possibly scarring.  Some new thick curvilinear bandlike focus of consolidation in the lingula.  New irregular nodular consolidation in anterior right lower lobe measuring 2.4 x 1.5 cm.  New small subpleural solid 0.4 cm pulmonary nodule in posterior right lower lobe.  Some areas of mild groundglass opacities appear new in right upper lobe and posterior peripheral left upper lobe when compared to previous.  Mild cylindrical bronchiectasis in  both lungs have improved.  Atherosclerosis present.  09/01/2021: Today-follow-up Patient presents today for follow-up after high-resolution CT scan.  He reports that his breathing has been stable and he has noticed an improvement in his activity tolerance.  He is able to complete his normal 6 miles a day versus 1 mile a day.  He has noticed an improvement in his stamina as well and no longer requiring a nap midday.  He does have some allergy type symptoms with postnasal drip and associated cough, occurring only in AM with clear sputum. Describes it as a "clearing cough". He states this is normal for this time of year when things begin to blood. He denies infectious symptoms such as fevers, chills, purulent sputum or persistent productive cough. He denies orthopnea, PND, chest pain or lower extremity swelling. No anorexia, unintentional weight loss, or hemoptysis. He continues on Stilto daily, but is unsure if it has made a huge impact on his breathing or not. He does feel better than he did at his last visit, from a breathing standpoint. Continues to smoke daily with no desire to quit.   He was evaluated by rheumatology in November due to abnormal CT and joint pain. Possible psoriatic arthritis given fluctuating ESR and hx vs osteoarthritis. No psoriasis plaques or other systemic symptoms noted.   Allergies  Allergen Reactions   Watermelon [Citrullus Vulgaris] Itching    Any melon   Coconut Oil Hives    Immunization History  Administered Date(s) Administered   Fluad Quad(high Dose 65+) 04/01/2020, 04/28/2021   Influenza Split 05/01/2014   Influenza,inj,Quad PF,6+ Mos 06/05/2013   Influenza,inj,quad, With Preservative 04/13/2019   Influenza-Unspecified 05/17/2016   PFIZER(Purple Top)SARS-COV-2 Vaccination 07/29/2019, 08/19/2019, 10/22/2020   Pneumococcal  Conjugate-13 04/01/2020   Pneumococcal Polysaccharide-23 06/18/2011   Tdap 06/18/2011   Zoster, Live 06/05/2013    Past Medical History:   Diagnosis Date   Adenomatous colon polyp    TA and SS   COPD (chronic obstructive pulmonary disease) (HCC)    Erectile dysfunction    Numbness and tingling    right arm    Paroxysmal A-fib (HCC)    no recurrence s/p atrial flutter ablation (previously atrial flutter felt to degenerate into afib)   Psoriasis    TIA (transient ischemic attack) 2004    Tobacco History: Social History   Tobacco Use  Smoking Status Every Day   Packs/day: 0.50   Years: 50.00   Pack years: 25.00   Types: Cigarettes   Start date: 1972  Smokeless Tobacco Never  Tobacco Comments   < 1/2 pack a day as of 05-2021    Ready to quit: Not Answered Counseling given: Not Answered Tobacco comments: < 1/2 pack a day as of 05-2021    Outpatient Medications Prior to Visit  Medication Sig Dispense Refill   aspirin 325 MG tablet aspirin 325 mg tablet  Take 1 tablet every day by oral route.     clobetasol ointment (TEMOVATE) 0.05 % APPLY TO AFFECTED AREA AFTER BATHING. (GENERIC PLEASE) 60 g 1   diltiazem (CARDIZEM CD) 120 MG 24 hr capsule TAKE 1 CAPSULE(120 MG) BY MOUTH DAILY 90 capsule 3   Multiple Vitamin (MULTIVITAMIN PO) Take by mouth.     Tiotropium Bromide-Olodaterol (STIOLTO RESPIMAT) 2.5-2.5 MCG/ACT AERS Inhale 2 puffs into the lungs daily. 1 each 5   tiZANidine (ZANAFLEX) 4 MG tablet Take 1 tablet (4 mg total) by mouth every 6 (six) hours as needed for muscle spasms. (Patient not taking: Reported on 08/20/2021) 30 tablet 0   No facility-administered medications prior to visit.     Review of Systems:   Constitutional: No weight loss or gain, night sweats, fevers, chills, fatigue, or lassitude. HEENT: No headaches, difficulty swallowing, tooth/dental problems, or sore throat. No sneezing, itching, ear ache, nasal congestion, or post nasal drip CV:  No chest pain, orthopnea, PND, swelling in lower extremities, anasarca, dizziness, palpitations, syncope Resp: +shortness of breath with strenuous  activity (significant improvement); morning only productive cough with clear sputum (attributes to postnasal drip). No excess mucus or change in color of mucus. No hemoptysis. No wheezing.  No chest wall deformity GI:  No heartburn, indigestion, abdominal pain, nausea, vomiting, diarrhea, change in bowel habits, loss of appetite, bloody stools.  GU: No dysuria, change in color of urine, urgency or frequency.  No flank pain, no hematuria  Skin: No rash, lesions, ulcerations MSK:  No joint swelling.  No decreased range of motion.  +chronic joint pain and back pain (unchanged) Neuro: No dizziness or lightheadedness.  Psych: No depression or anxiety. Mood stable.     Physical Exam:  BP 128/80 (BP Location: Left Arm, Patient Position: Sitting, Cuff Size: Normal)    Pulse 75    Temp 98.2 F (36.8 C) (Oral)    Ht 6' (1.829 m)    Wt 213 lb 3.2 oz (96.7 kg)    SpO2 96%    BMI 28.92 kg/m   GEN: Pleasant, interactive, well-appearing; in no acute distress. HEENT:  Normocephalic and atraumatic. EACs patent bilaterally. TM pearly gray with present light reflex bilaterally. PERRLA. Sclera white. Nasal turbinates pink, moist and patent bilaterally. Clear rhinorrhea present. Oropharynx erythematous and moist, without exudate or edema. No lesions, ulcerations NECK:  Supple w/ fair ROM. No JVD present. Normal carotid impulses w/o bruits. Thyroid symmetrical with no goiter or nodules palpated. No lymphadenopathy.   CV: RRR, no m/r/g, no peripheral edema. Pulses intact, +2 bilaterally. No cyanosis, pallor or clubbing. PULMONARY:  Unlabored, regular breathing. Clear bilaterally A&P w/o wheezes/rales/rhonchi. No accessory muscle use. No dullness to percussion. GI: BS present and normoactive. Soft, non-tender to palpation. No organomegaly or masses detected. No CVA tenderness. MSK: No erythema, warmth or tenderness. Cap refil <2 sec all extrem. No deformities or joint swelling noted.  Neuro: A/Ox3. No focal deficits  noted.   Skin: Warm, no lesions or rashe Psych: Normal affect and behavior. Judgement and thought content appropriate.     Lab Results:  CBC    Component Value Date/Time   WBC 7.6 05/13/2021 1039   RBC 4.22 05/13/2021 1039   HGB 13.8 05/13/2021 1039   HGB 13.9 10/31/2019 1351   HCT 40.8 05/13/2021 1039   HCT 40.8 10/31/2019 1351   PLT 354 05/13/2021 1039   PLT 206 10/31/2019 1351   MCV 96.7 05/13/2021 1039   MCV 100 (H) 10/31/2019 1351   MCH 32.7 05/13/2021 1039   MCHC 33.8 05/13/2021 1039   RDW 14.0 05/13/2021 1039   RDW 13.4 10/31/2019 1351   LYMPHSABS 2,082 05/13/2021 1039   LYMPHSABS 2.1 10/31/2019 1351   MONOABS 0.9 01/21/2021 1447   EOSABS 129 05/13/2021 1039   EOSABS 0.1 10/31/2019 1351   BASOSABS 61 05/13/2021 1039   BASOSABS 0.0 10/31/2019 1351    BMET    Component Value Date/Time   NA 138 05/13/2021 1039   NA 134 10/31/2019 1351   K 4.6 05/13/2021 1039   CL 100 05/13/2021 1039   CO2 29 05/13/2021 1039   GLUCOSE 93 05/13/2021 1039   BUN 14 05/13/2021 1039   BUN 21 10/31/2019 1351   CREATININE 0.66 (L) 05/13/2021 1039   CALCIUM 9.1 05/13/2021 1039   GFRNONAA 87 10/31/2019 1351   GFRAA 101 10/31/2019 1351    BNP No results found for: BNP   Imaging:  CT Chest High Resolution  Result Date: 08/18/2021 CLINICAL DATA:  Follow-up lung opacities. History of viral pneumonia in spring 2022. EXAM: CT CHEST WITHOUT CONTRAST TECHNIQUE: Multidetector CT imaging of the chest was performed following the standard protocol without intravenous contrast. High resolution imaging of the lungs, as well as inspiratory and expiratory imaging, was performed. RADIATION DOSE REDUCTION: This exam was performed according to the departmental dose-optimization program which includes automated exposure control, adjustment of the mA and/or kV according to patient size and/or use of iterative reconstruction technique. COMPARISON:  02/03/2021 chest CT. FINDINGS: Cardiovascular: Normal  heart size. No significant pericardial effusion/thickening. Three-vessel coronary atherosclerosis. Atherosclerotic nonaneurysmal thoracic aorta. Normal caliber pulmonary arteries. Mediastinum/Nodes: No discrete thyroid nodules. Unremarkable esophagus. No pathologically enlarged axillary lymph nodes. Mildly enlarged 1.1 cm subcarinal node (series 2/image 60), stable. No additional pathologically enlarged mediastinal nodes. No discrete hilar adenopathy on these noncontrast images. Lungs/Pleura: No pneumothorax. No pleural effusion. Previously visualized extensive bandlike regions of consolidation and ground-glass opacity throughout both lungs on 02/03/2021 chest CT have substantially decreased with residual mild patchy ground-glass opacities and reticulation in these locations. New thick curvilinear bandlike focus of consolidation in the lingula (series 3/image 74). New irregular nodular focus of consolidation in the anterior basilar right lower lobe measuring 2.4 x 1.5 cm (series 3/image 109). New small subpleural solid 0.4 cm pulmonary nodule in the posterior basilar right lower lobe (series 3/image 122). Some  of the mild patchy regions of ground-glass opacity on today's scan appear new in location since 02/03/2021 chest CT, for example in the right upper lobe (series 3/image 53) and in posterior peripheral left upper lobe (series 3/image 35). Scattered regions of mild cylindrical bronchiectasis in both lungs, overall improved from prior CT. No frank honeycombing. No clear apicobasilar gradient to these findings. No significant lobular air trapping or evidence of tracheobronchomalacia on the expiration sequence. Upper abdomen: No acute abnormality. Musculoskeletal: No aggressive appearing focal osseous lesions. Subcutaneous loop recorder in the ventral upper left chest wall. Mild thoracic spondylosis. IMPRESSION: 1. Waxing and waning patchy regions of consolidation and ground-glass opacity throughout both lungs as  detailed. The leading differential considerations include cryptogenic organizing pneumonia versus recurrent multilobar infection. Suggest continued high-resolution chest CT surveillance. 2. One of the new foci of consolidation on today's scan in the right lower lobe demonstrates an aggressive nodular morphology and is indeterminate for infectious/inflammatory versus malignant etiology in this high risk patient. Suggest follow-up chest CT in 3 months. Alternatively, bronchoscopic evaluation or PET-CT could be considered at this time, as clinically warranted. 3. Stable mild mediastinal lymphadenopathy, most suggestive of reactive adenopathy. 4. Three-vessel coronary atherosclerosis. 5. Aortic Atherosclerosis (ICD10-I70.0). These results will be called to the ordering clinician or representative by the Radiologist Assistant, and communication documented in the PACS or [employer]. Electronically Signed   By: Ilona Sorrel M.D.   On: 08/18/2021 17:15   CUP PACEART REMOTE DEVICE CHECK  Result Date: 08/18/2021 ILR summary report received. Battery status OK. Normal device function. No new symptom, tachy, brady, or pause episodes. No new AF episodes. Monthly summary reports and ROV/PRN LA     PFT Results Latest Ref Rng & Units 03/19/2021  FVC-Pre L 4.18  FVC-Predicted Pre % 87  FVC-Post L 4.20  FVC-Predicted Post % 88  Pre FEV1/FVC % % 74  Post FEV1/FCV % % 73  FEV1-Pre L 3.09  FEV1-Predicted Pre % 88  FEV1-Post L 3.06  DLCO uncorrected ml/min/mmHg 22.47  DLCO UNC% % 81  DLCO corrected ml/min/mmHg 25.85  DLCO COR %Predicted % 93  DLVA Predicted % 104  TLC L 6.71  TLC % Predicted % 90  RV % Predicted % 99    No results found for: NITRICOXIDE      Assessment & Plan:   Emphysema lung (HCC) Breathing improved. Normal spirometry with normal diffusion capacity. Pt unable to tell if it is Stiolto or just gradual improvement that have helped his breathing. Discussed continuing vs trial off of  it - pt decided to continue, especially going into allergy season when his breathing tends to worsen. Smoking cessation advised; no desire to quit at this time.   Patient Instructions  -Continue Stiolto 2 puffs daily  -Restart flonase nasal spray 2 sprays each nostril daily -Start Zyrtec or Claritin 10 mg over the counter daily   Can use saline nasal spray 2-3 times a day for nasal drainage/post nasal drip  We will contact you regarding next steps from your CT scan  Follow up in 3 months with Dr. Halford Chessman or Katie Marinell Igarashi,NP. If symptoms do not improve or worsen, please contact office for sooner follow up or seek emergency care.    Allergic rhinitis Advised restarting flonase nasal spray. Reported he has OTC funds to use - advised to start Zyrtec or Claritin 10 mg daily.   Lung nodule Concerning, new 2.4x1.5cm irregular nodule and solid 4 mm nodule on HRCT. Inflammatory vs malignancy. No other  symptoms of concern such as weight loss, anorexia or hemoptysis. Extensive smoking history. Will discuss next steps with Dr. Halford Chessman.  Tobacco abuse Smoking cessation advised - declined.   Psoriasis Followed by rheumatology. Concern for psoriatic arthritis vs osteoarthritis. No evidence of psoriatic plaques or other systemic findings on most recent exam. ESR elevated; otherwise unremarkable lab findings.  Interstitial pulmonary disease ([place]) Waxing and waning ground glass opacities. BTX has improved. New pulmonary nodules. Concern for inflammatory vs malignant etiology. Breathing has improved and no new infectious symptoms reported.    Clayton Bibles, NP 09/01/2021  Pt aware and understands NP's role.

## 2021-09-01 NOTE — Assessment & Plan Note (Addendum)
Concerning, new 2.4x1.5cm irregular nodule and solid 4 mm nodule on HRCT. Inflammatory vs malignancy. No other symptoms of concern such as weight loss, anorexia or hemoptysis. Extensive smoking history. Will discuss next steps with Dr. Halford Chessman.

## 2021-09-01 NOTE — Assessment & Plan Note (Signed)
Breathing improved. Normal spirometry with normal diffusion capacity. Pt unable to tell if it is Stiolto or just gradual improvement that have helped his breathing. Discussed continuing vs trial off of it - pt decided to continue, especially going into allergy season when his breathing tends to worsen. Smoking cessation advised; no desire to quit at this time.   Patient Instructions  -Continue Stiolto 2 puffs daily  -Restart flonase nasal spray 2 sprays each nostril daily -Start Zyrtec or Claritin 10 mg over the counter daily   Can use saline nasal spray 2-3 times a day for nasal drainage/post nasal drip  We will contact you regarding next steps from your CT scan  Follow up in 3 months with Dr. Halford Chessman or Katie Caria Transue,NP. If symptoms do not improve or worsen, please contact office for sooner follow up or seek emergency care.

## 2021-09-01 NOTE — Assessment & Plan Note (Signed)
Advised restarting flonase nasal spray. Reported he has OTC funds to use - advised to start Zyrtec or Claritin 10 mg daily.

## 2021-09-01 NOTE — Patient Instructions (Addendum)
-  Continue Stiolto 2 puffs daily  -Restart flonase nasal spray 2 sprays each nostril daily -Start Zyrtec or Claritin 10 mg over the counter daily   Can use saline nasal spray 2-3 times a day for nasal drainage/post nasal drip  We will contact you regarding next steps from your CT scan  Follow up in 3 months with Dr. Halford Chessman or Katie Glendon Fiser,NP. If symptoms do not improve or worsen, please contact office for sooner follow up or seek emergency care.

## 2021-09-01 NOTE — Assessment & Plan Note (Signed)
Smoking cessation advised - declined.

## 2021-09-02 ENCOUNTER — Telehealth: Payer: Self-pay

## 2021-09-02 ENCOUNTER — Encounter: Payer: Self-pay | Admitting: Gastroenterology

## 2021-09-02 NOTE — Telephone Encounter (Signed)
°  Follow up Call-  Call back number 08/28/2021 02/12/2020  Post procedure Call Back phone  # [phone]  Permission to leave phone message Yes Yes  Some recent data might be hidden     Patient questions:  Do you have a fever, pain , or abdominal swelling? No. Pain Score  0 *  Have you tolerated food without any problems? Yes.    Have you been able to return to your normal activities? Yes.    Do you have any questions about your discharge instructions: Diet   No. Medications  No. Follow up visit  No.  Do you have questions or concerns about your Care? No.  Actions: * If pain score is 4 or above: No action needed, pain <4.

## 2021-09-08 ENCOUNTER — Encounter: Payer: Self-pay | Admitting: Dermatology

## 2021-09-08 NOTE — Addendum Note (Signed)
Addended by: Lavonna Monarch on: 09/08/2021 07:20 AM   Modules accepted: Level of Service

## 2021-09-08 NOTE — Progress Notes (Signed)
° °  New Patient   Subjective  Lawrence Rivas is a 71 y.o. male who presents for the following: Psoriasis (Pt here today with a  referral Dr. Estanislado Pandy to figure out if he has psoriasis. ).  Check for psoriasis, several other skin issues Location:  Duration:  Quality:  Associated Signs/Symptoms: Modifying Factors:  Severity:  Timing: Context:    The following portions of the chart were reviewed this encounter and updated as appropriate:  Tobacco   Allergies   Meds   Problems   Med Hx   Surg Hx   Fam Hx       Objective  Well appearing patient in no apparent distress; mood and affect are within normal limits. Left Ankle - Anterior, Left Hand - Posterior, Left Lower Leg - Posterior, Right Ankle - Anterior, Right Lower Leg - Posterior 2 to 4 mm hypopigmented verrucous to scaly papules  Right Lower Eyelid 1 mm pedunculated flesh-colored papule  Right Abdomen (side) - Upper (3) Multiple 1 mm smooth red dermal papules  Scalp General skin examination.  Currently Lawrence Rivas has no typical plaques of psoriasis on his scalp, ears, back, gluteal, torso, joints, nails.  No atypical pigmented lesions or nonmelanoma skin cancer.  Right Lower Leg - Anterior Edema, hemosiderosis, inflammation of both lower legs compatible with chronic stasis dermatitis.  Palpable pedal pulses.    A full examination was performed including scalp, head, eyes, ears, nose, lips, neck, chest, axillae, abdomen, back, buttocks, bilateral upper extremities, bilateral lower extremities, hands, feet, fingers, toes, fingernails, and toenails. All findings within normal limits unless otherwise noted below.   Assessment & Plan  Acrokeratosis (5) Left Hand - Posterior; Left Ankle - Anterior; Right Ankle - Anterior; Left Lower Leg - Posterior; Right Lower Leg - Posterior  No intervention indicated  Skin tag Right Lower Eyelid  No intervention initiated  Hemangioma of skin (3) Right Abdomen (side) -  Upper  No intervention necessary  Encounter for screening for malignant neoplasm of skin Scalp  Since at least 5% of people who prove to have psoriatic arthritis do not have any cutaneous disease, the absence of findings of skin psoriasis today must be put in context.  Annual skin examination  Venous stasis dermatitis of right lower extremity Right Lower Leg - Anterior  We discussed ways to minimize venous pressure.  He will try topical clobetasol daily after bathing for 1 month and give me a status update at that time by MyChart.  clobetasol ointment (TEMOVATE) 0.05 % - Right Lower Leg - Anterior APPLY TO AFFECTED AREA AFTER BATHING. (GENERIC PLEASE)  Venous stasis dermatitis of left lower extremity Left Lower Leg - Anterior

## 2021-09-10 ENCOUNTER — Ambulatory Visit: Payer: Medicare Other | Admitting: Rheumatology

## 2021-09-10 DIAGNOSIS — Q6671 Congenital pes cavus, right foot: Secondary | ICD-10-CM

## 2021-09-10 DIAGNOSIS — R21 Rash and other nonspecific skin eruption: Secondary | ICD-10-CM

## 2021-09-10 DIAGNOSIS — J432 Centrilobular emphysema: Secondary | ICD-10-CM

## 2021-09-10 DIAGNOSIS — M1711 Unilateral primary osteoarthritis, right knee: Secondary | ICD-10-CM

## 2021-09-10 DIAGNOSIS — Z720 Tobacco use: Secondary | ICD-10-CM

## 2021-09-10 DIAGNOSIS — J479 Bronchiectasis, uncomplicated: Secondary | ICD-10-CM

## 2021-09-10 DIAGNOSIS — M545 Low back pain, unspecified: Secondary | ICD-10-CM

## 2021-09-10 DIAGNOSIS — M7061 Trochanteric bursitis, right hip: Secondary | ICD-10-CM

## 2021-09-10 DIAGNOSIS — R5383 Other fatigue: Secondary | ICD-10-CM

## 2021-09-10 DIAGNOSIS — I480 Paroxysmal atrial fibrillation: Secondary | ICD-10-CM

## 2021-09-10 DIAGNOSIS — I700 Atherosclerosis of aorta: Secondary | ICD-10-CM

## 2021-09-10 DIAGNOSIS — Z96641 Presence of right artificial hip joint: Secondary | ICD-10-CM

## 2021-09-10 DIAGNOSIS — M722 Plantar fascial fibromatosis: Secondary | ICD-10-CM

## 2021-09-10 DIAGNOSIS — G8929 Other chronic pain: Secondary | ICD-10-CM

## 2021-09-10 DIAGNOSIS — M19072 Primary osteoarthritis, left ankle and foot: Secondary | ICD-10-CM

## 2021-09-10 DIAGNOSIS — L409 Psoriasis, unspecified: Secondary | ICD-10-CM

## 2021-09-11 ENCOUNTER — Telehealth: Payer: Self-pay | Admitting: Nurse Practitioner

## 2021-09-11 DIAGNOSIS — R911 Solitary pulmonary nodule: Secondary | ICD-10-CM

## 2021-09-11 NOTE — Telephone Encounter (Signed)
Called patient and he stated that [facility] said something about a PET scan. I am not seeing anything in the chart about a PET scan. ? ?Katie please advise  ?

## 2021-09-11 NOTE — Telephone Encounter (Signed)
I re-entered the order again today. Someone should contact him tomorrow for scheduling. Have him let us know if he doesn't hear back from them regarding this. Thanks!

## 2021-09-17 DIAGNOSIS — M19011 Primary osteoarthritis, right shoulder: Secondary | ICD-10-CM | POA: Diagnosis not present

## 2021-09-20 LAB — CUP PACEART REMOTE DEVICE CHECK
Date Time Interrogation Session: 20230310230624
Implantable Pulse Generator Implant Date: 20210301

## 2021-09-22 ENCOUNTER — Ambulatory Visit (INDEPENDENT_AMBULATORY_CARE_PROVIDER_SITE_OTHER): Payer: Medicare Other

## 2021-09-22 DIAGNOSIS — I480 Paroxysmal atrial fibrillation: Secondary | ICD-10-CM

## 2021-09-29 ENCOUNTER — Other Ambulatory Visit: Payer: Self-pay | Admitting: Adult Health

## 2021-09-29 ENCOUNTER — Ambulatory Visit (INDEPENDENT_AMBULATORY_CARE_PROVIDER_SITE_OTHER): Payer: Medicare Other

## 2021-09-29 VITALS — Ht 72.0 in | Wt 213.0 lb

## 2021-09-29 DIAGNOSIS — Z0000 Encounter for general adult medical examination without abnormal findings: Secondary | ICD-10-CM | POA: Diagnosis not present

## 2021-09-29 DIAGNOSIS — J432 Centrilobular emphysema: Secondary | ICD-10-CM

## 2021-09-29 NOTE — Progress Notes (Addendum)
? ?Subjective:  ? Lawrence Rivas is a 71 y.o. male who presents for an Initial Medicare Annual Wellness Visit. ? ?I connected with [place] today by telephone and verified that I am speaking with the correct person using two identifiers. ?Location patient: home ?Location provider: work ?Persons participating in the virtual visit: patient, nurse.  ?  ?I discussed the limitations, risks, security and privacy concerns of performing an evaluation and management service by telephone and the availability of in person appointments. I also discussed with the patient that there may be a patient responsible charge related to this service. The patient expressed understanding and verbally consented to this telephonic visit.  ?  ?Interactive audio and video telecommunications were attempted between this provider and patient, however failed, due to patient having technical difficulties OR patient did not have access to video capability.  We continued and completed visit with audio only. ? ?Some vital signs may be absent or patient reported.  ? ?Time Spent with patient on telephone encounter: 30 minutes ? ? ?Review of Systems    ? ?Cardiac Risk Factors include: advanced age (>82mn, >>4women);male gender;smoking/ tobacco exposure ? ?   ?Objective:  ?  ?Today's Vitals  ? 09/29/21 0915  ?Weight: 213 lb (96.6 kg)  ?Height: 6' (1.829 m)  ? ?Body mass index is 28.89 kg/m?. ? ?Advanced Directives 09/29/2021 11/07/2019  ?Does Patient Have a Medical Advance Directive? No Yes  ?Type of [place]  ?Does patient want to make changes to medical advance directive? - No - Patient declined  ?Copy of [place]? - No - copy requested  ? ? ?Current Medications (verified) ?Outpatient Encounter Medications as of 09/29/2021  ?Medication Sig  ? aspirin 325 MG tablet aspirin 325 mg tablet ? Take 1 tablet every day by oral route.  ? clobetasol ointment (TEMOVATE) 0.05 % APPLY  TO AFFECTED AREA AFTER BATHING. (GENERIC PLEASE)  ? diltiazem (CARDIZEM CD) 120 MG 24 hr capsule TAKE 1 CAPSULE(120 MG) BY MOUTH DAILY  ? Multiple Vitamin (MULTIVITAMIN PO) Take by mouth.  ? Tiotropium Bromide-Olodaterol (STIOLTO RESPIMAT) 2.5-2.5 MCG/ACT AERS Inhale 2 puffs into the lungs daily.  ? tiZANidine (ZANAFLEX) 4 MG tablet Take 1 tablet (4 mg total) by mouth every 6 (six) hours as needed for muscle spasms. (Patient not taking: Reported on 08/20/2021)  ? ?No facility-administered encounter medications on file as of 09/29/2021.  ? ? ?Allergies (verified) ?Watermelon [citrullus vulgaris] and Coconut oil  ? ?History: ?Past Medical History:  ?Diagnosis Date  ? Adenomatous colon polyp   ? TA and SS  ? COPD [place] disease) [place]   ? Erectile dysfunction   ? Numbness and tingling   ? right arm   ? [place]   ? no recurrence s/p atrial flutter ablation (previously atrial flutter felt to degenerate into afib)  ? Psoriasis   ? TIA (transient ischemic attack) 2004  ? ?Past Surgical History:  ?Procedure Laterality Date  ? [facility]    [facility]  ? ATRIAL FIBRILLATION ABLATION N/A 11/07/2019  ? Procedure: ATRIAL FIBRILLATION ABLATION;  Surgeon: AThompson Grayer MD;  [facility];  Service: Cardiovascular;  Laterality: N/A;  ? implantable loop recorder insertion  09/11/2019  ? Medtronic Reveal Linq model LNQ 22 ([id number] implantable loop recorder implanted by Dr ARayann Hemanfor afib management and evaluation of palpitations  ? TONSILLECTOMY    ?  TOTAL HIP ARTHROPLASTY    ? right  ? ?Family History  ?Problem Relation Age of Onset  ? Stroke Mother   ?     in her 87-90s  ? Uterine cancer Mother   ? Coronary artery disease Father 53  ? Diabetes Maternal Grandmother   ? Healthy Son   ? Colon cancer Neg Hx   ? Prostate cancer Neg Hx   ? Colon polyps Neg Hx   ? Esophageal cancer Neg Hx   ? Rectal cancer Neg Hx   ? Stomach  cancer Neg Hx   ? ?Social History  ? ?Socioeconomic History  ? Marital status: Married  ?  Spouse name: Not on file  ? Number of children: 1  ? Years of education: Not on file  ? Highest education level: Not on file  ?Occupational History  ? Occupation: Architect  ?  Comment: [name]   ? Occupation: retired   ?  Employer: [employer]  ?Tobacco Use  ? Smoking status: Every Day  ?  Packs/day: 0.50  ?  Years: 50.00  ?  Pack years: 25.00  ?  Types: Cigarettes  ?  Start date: 9  ? Smokeless tobacco: Never  ? Tobacco comments:  ?  < 1/2 pack a day as of 05-2021   ?Vaping Use  ? Vaping Use: Never used  ?Substance and Sexual Activity  ? Alcohol use: Yes  ?  Comment: 2 drinks of scotch per day  ? Drug use: No  ? Sexual activity: Not on file  ?Other Topics Concern  ? Not on file  ?Social History Narrative  ? Household: pt and wife    ? ?Social Determinants of Health  ? ?Financial Resource Strain: Low Risk   ? Difficulty of Paying Living Expenses: Not hard at all  ?Food Insecurity: No Food Insecurity  ? Worried About Charity fundraiser in the Last Year: Never true  ? Ran Out of Food in the Last Year: Never true  ?Transportation Needs: No Transportation Needs  ? Lack of Transportation (Medical): No  ? Lack of Transportation (Non-Medical): No  ?Physical Activity: Sufficiently Active  ? Days of Exercise per Week: 7 days  ? Minutes of Exercise per Session: 120 min  ?Stress: No Stress Concern Present  ? Feeling of Stress : Only a little  ?Social Connections: Not on file  ? ? ?Tobacco Counseling ?Ready to quit: Not Answered ?Counseling given: Not Answered ?Tobacco comments: < 1/2 pack a day as of 05-2021  ? ? ?Clinical Intake: ? ?Pre-visit preparation completed: Yes ? ?Pain : No/denies pain ? ?  ? ?BMI - recorded: 28.89 ?Nutritional Status: BMI 25 -29 Overweight ?Nutritional Risks: None ?Diabetes: No ? ?How often do you need to have someone help you when you read instructions, pamphlets, or other written materials from your  doctor or pharmacy?: 1 - Never ? ?Diabetic?No ? ?Interpreter Needed?: No ? ?Information entered by :: Caroleen Hamman LPN ? ? ?Activities of Daily Living ?In your present state of health, do you have any difficulty performing the following activities: 09/29/2021 01/21/2021  ?Hearing? N N  ?Vision? N N  ?Difficulty concentrating or making decisions? N N  ?Walking or climbing stairs? N N  ?Dressing or bathing? N N  ?Doing errands, shopping? N N  ?Preparing Food and eating ? N -  ?Using the Toilet? N -  ?In the past six months, have you accidently leaked urine? Y -  ?Do you have problems  with loss of bowel control? Y -  ?Comment Has seeb GI -  ?Managing your Medications? N -  ?Managing your Finances? N -  ?Housekeeping or managing your Housekeeping? N -  ?Some recent data might be hidden  ? ? ?Patient Care Team: ?Colon Branch, MD as PCP - General (Internal Medicine) ?Thompson Grayer, MD as PCP - Cardiology (Cardiology) ?Justice Britain, MD as Consulting Physician (Orthopedic Surgery) ?Chesley Mires, MD as Consulting Physician (Pulmonary Disease) ?Doran Stabler, MD as Consulting Physician (Gastroenterology) ?Lavonna Monarch, MD as Consulting Physician (Dermatology) ? ?Indicate any recent Medical Services you may have received from other than [facility] providers in the past year (date may be approximate). ? ?   ?Assessment:  ? This is a routine wellness examination for [place]. ? ?Hearing/Vision screen ?Hearing Screening - Comments:: No issues ?Vision Screening - Comments:: Last eye exam-2022-[facility] ? ?Dietary issues and exercise activities discussed: ?Current Exercise Habits: Home exercise routine, Type of exercise: walking, Time (Minutes): 60, Frequency (Times/Week): 7, Weekly Exercise (Minutes/Week): 420, Intensity: Mild, Exercise limited by: orthopedic condition(s) ? ? Goals Addressed   ? ?  ?  ?  ?  ? This Visit's Progress  ?  Patient Stated     ?  Would like to lose some weight ?  ? ?  ? ?Depression Screen ?PHQ 2/9  Scores 09/29/2021 11/14/2020 11/11/2020 01/02/2020 06/05/2013 06/05/2013  ?PHQ - 2 Score 0 2 0 0 0 0  ?PHQ- 9 Score - 4 - 3 - -  ?  ?Fall Risk ?Fall Risk  09/29/2021 11/14/2020 11/11/2020 01/02/2020 06/05/2013  ?Falls

## 2021-09-29 NOTE — Patient Instructions (Signed)
Lawrence Rivas , ?Thank you for taking time to complete your Medicare Wellness Visit. I appreciate your ongoing commitment to your health goals. Please review the following plan we discussed and let me know if I can assist you in the future.  ? ?Screening recommendations/referrals: ?Colonoscopy: Completed 08/28/2021-Due 08/28/2024 ?Recommended yearly ophthalmology/optometry visit for glaucoma screening and checkup ?Recommended yearly dental visit for hygiene and checkup ? ?Vaccinations: ?Influenza vaccine: Up to date ?Pneumococcal vaccine: Due-May obtain vaccine at our office or your local pharmacy. ?Tdap vaccine: May obtain vaccine at your local pharmacy. ?Shingles vaccine:   May obtain vaccine at your local pharmacy. ?Covid-19: Up to date ? ?Advanced directives: May pick up forms at our office. ? ? ?Conditions/risks identified: See problem list ? ?Next appointment: Follow up in one year for your annual wellness visit.  ? ?Preventive Care 55 Years and Older, Male ?Preventive care refers to lifestyle choices and visits with your health care provider that can promote health and wellness. ?What does preventive care include? ?A yearly physical exam. This is also called an annual well check. ?Dental exams once or twice a year. ?Routine eye exams. Ask your health care provider how often you should have your eyes checked. ?Personal lifestyle choices, including: ?Daily care of your teeth and gums. ?Regular physical activity. ?Eating a healthy diet. ?Avoiding tobacco and drug use. ?Limiting alcohol use. ?Practicing safe sex. ?Taking low doses of aspirin every day. ?Taking vitamin and mineral supplements as recommended by your health care provider. ?What happens during an annual well check? ?The services and screenings done by your health care provider during your annual well check will depend on your age, overall health, lifestyle risk factors, and family history of disease. ?Counseling  ?Your health care provider may ask you  questions about your: ?Alcohol use. ?Tobacco use. ?Drug use. ?Emotional well-being. ?Home and relationship well-being. ?Sexual activity. ?Eating habits. ?History of falls. ?Memory and ability to understand (cognition). ?Work and work Statistician. ?Screening  ?You may have the following tests or measurements: ?Height, weight, and BMI. ?Blood pressure. ?Lipid and cholesterol levels. These may be checked every 5 years, or more frequently if you are over 55 years old. ?Skin check. ?Lung cancer screening. You may have this screening every year starting at age 73 if you have a 30-pack-year history of smoking and currently smoke or have quit within the past 15 years. ?Fecal occult blood test (FOBT) of the stool. You may have this test every year starting at age 55. ?Flexible sigmoidoscopy or colonoscopy. You may have a sigmoidoscopy every 5 years or a colonoscopy every 10 years starting at age 37. ?Prostate cancer screening. Recommendations will vary depending on your family history and other risks. ?Hepatitis C blood test. ?Hepatitis B blood test. ?Sexually transmitted disease (STD) testing. ?Diabetes screening. This is done by checking your blood sugar (glucose) after you have not eaten for a while (fasting). You may have this done every 1-3 years. ?Abdominal aortic aneurysm (AAA) screening. You may need this if you are a current or former smoker. ?Osteoporosis. You may be screened starting at age 59 if you are at high risk. ?Talk with your health care provider about your test results, treatment options, and if necessary, the need for more tests. ?Vaccines  ?Your health care provider may recommend certain vaccines, such as: ?Influenza vaccine. This is recommended every year. ?Tetanus, diphtheria, and acellular pertussis (Tdap, Td) vaccine. You may need a Td booster every 10 years. ?Zoster vaccine. You may need this after age 29. ?  Pneumococcal 13-valent conjugate (PCV13) vaccine. One dose is recommended after age  14. ?Pneumococcal polysaccharide (PPSV23) vaccine. One dose is recommended after age 62. ?Talk to your health care provider about which screenings and vaccines you need and how often you need them. ?This information is not intended to replace advice given to you by your health care provider. Make sure you discuss any questions you have with your health care provider. ?Document Released: 07/26/2015 Document Revised: 03/18/2016 Document Reviewed: 04/30/2015 ?Elsevier Interactive Patient Education ? 2017 [place]. ? ?Fall Prevention in the Home ?Falls can cause injuries. They can happen to people of all ages. There are many things you can do to make your home safe and to help prevent falls. ?What can I do on the outside of my home? ?Regularly fix the edges of walkways and driveways and fix any cracks. ?Remove anything that might make you trip as you walk through a door, such as a raised step or threshold. ?Trim any bushes or trees on the path to your home. ?Use bright outdoor lighting. ?Clear any walking paths of anything that might make someone trip, such as rocks or tools. ?Regularly check to see if handrails are loose or broken. Make sure that both sides of any steps have handrails. ?Any raised decks and porches should have guardrails on the edges. ?Have any leaves, snow, or ice cleared regularly. ?Use sand or salt on walking paths during winter. ?Clean up any spills in your garage right away. This includes oil or grease spills. ?What can I do in the bathroom? ?Use night lights. ?Install grab bars by the toilet and in the tub and shower. Do not use towel bars as grab bars. ?Use non-skid mats or decals in the tub or shower. ?If you need to sit down in the shower, use a plastic, non-slip stool. ?Keep the floor dry. Clean up any water that spills on the floor as soon as it happens. ?Remove soap buildup in the tub or shower regularly. ?Attach bath mats securely with double-sided non-slip rug tape. ?Do not have throw  rugs and other things on the floor that can make you trip. ?What can I do in the bedroom? ?Use night lights. ?Make sure that you have a light by your bed that is easy to reach. ?Do not use any sheets or blankets that are too big for your bed. They should not hang down onto the floor. ?Have a firm chair that has side arms. You can use this for support while you get dressed. ?Do not have throw rugs and other things on the floor that can make you trip. ?What can I do in the kitchen? ?Clean up any spills right away. ?Avoid walking on wet floors. ?Keep items that you use a lot in easy-to-reach places. ?If you need to reach something above you, use a strong step stool that has a grab bar. ?Keep electrical cords out of the way. ?Do not use floor polish or wax that makes floors slippery. If you must use wax, use non-skid floor wax. ?Do not have throw rugs and other things on the floor that can make you trip. ?What can I do with my stairs? ?Do not leave any items on the stairs. ?Make sure that there are handrails on both sides of the stairs and use them. Fix handrails that are broken or loose. Make sure that handrails are as long as the stairways. ?Check any carpeting to make sure that it is firmly attached to the stairs. Fix any  carpet that is loose or worn. ?Avoid having throw rugs at the top or bottom of the stairs. If you do have throw rugs, attach them to the floor with carpet tape. ?Make sure that you have a light switch at the top of the stairs and the bottom of the stairs. If you do not have them, ask someone to add them for you. ?What else can I do to help prevent falls? ?Wear shoes that: ?Do not have high heels. ?Have rubber bottoms. ?Are comfortable and fit you well. ?Are closed at the toe. Do not wear sandals. ?If you use a stepladder: ?Make sure that it is fully opened. Do not climb a closed stepladder. ?Make sure that both sides of the stepladder are locked into place. ?Ask someone to hold it for you, if  possible. ?Clearly mark and make sure that you can see: ?Any grab bars or handrails. ?First and last steps. ?Where the edge of each step is. ?Use tools that help you move around (mobility aids) if they are needed. T

## 2021-09-30 ENCOUNTER — Telehealth: Payer: Self-pay | Admitting: Pulmonary Disease

## 2021-09-30 ENCOUNTER — Encounter (HOSPITAL_COMMUNITY)
Admission: RE | Admit: 2021-09-30 | Discharge: 2021-09-30 | Disposition: A | Discharge status: Home / Self Care | Payer: Medicare Other | Source: Ambulatory Visit | Attending: Nurse Practitioner | Admitting: Nurse Practitioner

## 2021-09-30 ENCOUNTER — Other Ambulatory Visit: Payer: Self-pay

## 2021-09-30 DIAGNOSIS — N3289 Other specified disorders of bladder: Secondary | ICD-10-CM | POA: Diagnosis not present

## 2021-09-30 DIAGNOSIS — I700 Atherosclerosis of aorta: Secondary | ICD-10-CM | POA: Diagnosis not present

## 2021-09-30 DIAGNOSIS — R918 Other nonspecific abnormal finding of lung field: Secondary | ICD-10-CM | POA: Diagnosis not present

## 2021-09-30 DIAGNOSIS — R911 Solitary pulmonary nodule: Secondary | ICD-10-CM | POA: Insufficient documentation

## 2021-09-30 LAB — GLUCOSE, CAPILLARY: Glucose-Capillary: 103 mg/dL — ABNORMAL HIGH (ref 70–99)

## 2021-09-30 MED ORDER — FLUDEOXYGLUCOSE F - 18 (FDG) INJECTION
10.6000 | Freq: Once | INTRAVENOUS | Status: AC
  Administered 2021-09-30: 10.6 via INTRAVENOUS

## 2021-09-30 NOTE — Telephone Encounter (Signed)
According to our records, Stiolto was sent in today.  ? ?Lm for patient.  ?

## 2021-10-03 NOTE — Progress Notes (Signed)
Carelink Summary Report / Loop Recorder 

## 2021-10-06 NOTE — Progress Notes (Signed)
Please notify pt that his PET scan was reassuring. The previous area of concern from his CT chest scan has decreased in size and was not hypermetabolic, which means it is likely not cancerous. There was a new nodule which was 2.4x1.5cm and did not some low level hypermetabolism; however, given the speed at which it developed, it is likely infectious/inflammatory in nature. We will obtain repeat CT chest scan in 3 months to ensure this has not grown. Thanks.

## 2021-10-27 ENCOUNTER — Ambulatory Visit (INDEPENDENT_AMBULATORY_CARE_PROVIDER_SITE_OTHER): Payer: Medicare Other

## 2021-10-27 DIAGNOSIS — I480 Paroxysmal atrial fibrillation: Secondary | ICD-10-CM | POA: Diagnosis not present

## 2021-10-28 LAB — CUP PACEART REMOTE DEVICE CHECK
Date Time Interrogation Session: 20230416231612
Implantable Pulse Generator Implant Date: 20210301

## 2021-11-12 NOTE — Progress Notes (Signed)
Carelink Summary Report / Loop Recorder 

## 2021-11-18 ENCOUNTER — Ambulatory Visit (INDEPENDENT_AMBULATORY_CARE_PROVIDER_SITE_OTHER): Payer: Medicare Other | Admitting: Internal Medicine

## 2021-11-18 ENCOUNTER — Encounter: Payer: Self-pay | Admitting: Internal Medicine

## 2021-11-18 VITALS — BP 128/80 | HR 81 | Temp 98.5°F | Resp 16 | Ht 72.0 in | Wt 209.5 lb

## 2021-11-18 DIAGNOSIS — I480 Paroxysmal atrial fibrillation: Secondary | ICD-10-CM | POA: Diagnosis not present

## 2021-11-18 DIAGNOSIS — M159 Polyosteoarthritis, unspecified: Secondary | ICD-10-CM | POA: Diagnosis not present

## 2021-11-18 DIAGNOSIS — M1990 Unspecified osteoarthritis, unspecified site: Secondary | ICD-10-CM | POA: Insufficient documentation

## 2021-11-18 LAB — BASIC METABOLIC PANEL
BUN: 16 mg/dL (ref 6–23)
CO2: 27 mEq/L (ref 19–32)
Calcium: 9.2 mg/dL (ref 8.4–10.5)
Chloride: 99 mEq/L (ref 96–112)
Creatinine, Ser: 0.71 mg/dL (ref 0.40–1.50)
GFR: 92.28 mL/min (ref >60.00)
Glucose, Bld: 95 mg/dL (ref 70–99)
Potassium: 4.8 mEq/L (ref 3.5–5.1)
Sodium: 135 mEq/L (ref 135–145)

## 2021-11-18 NOTE — Progress Notes (Signed)
? ?Subjective:  ? ? Patient ID: Lawrence Rivas, male    DOB: 23-Lawrence-1952, 71 y.o.   MRN: [id number] ? ?DOS:  11/18/2021 ?Type of visit - description: f/u ? ?Routine follow-up. ?In general he feels better. ?Able to walk 2.5 miles twice daily. ?Continue with right shoulder pain. ?A-fib: Seems to be asymptomatic with no palpitations ? ? ?Review of Systems ?Denies fevers , has some cough, he thinks more related to allergies than pulmonary issues. ? ?Past Medical History:  ?Diagnosis Date  ? Adenomatous colon polyp   ? TA and SS  ? COPD (chronic obstructive pulmonary disease) ([place])   ? Erectile dysfunction   ? Numbness and tingling   ? right arm   ? Paroxysmal A-fib ([place])   ? no recurrence s/p atrial flutter ablation (previously atrial flutter felt to degenerate into afib)  ? Psoriasis   ? TIA (transient ischemic attack) 2004  ? ? ?Past Surgical History:  ?Procedure Laterality Date  ? A FLUTTER ABLATION    ? March 2011 at [facility] by Dr Lyanne Co Dixat  ? ATRIAL FIBRILLATION ABLATION N/A 11/07/2019  ? Procedure: ATRIAL FIBRILLATION ABLATION;  Surgeon: Thompson Grayer, MD;  Location: [facility];  Service: Cardiovascular;  Laterality: N/A;  ? implantable loop recorder insertion  09/11/2019  ? Medtronic Reveal Linq model LNQ 22 ([id number] [id number]) implantable loop recorder implanted by Dr Rayann Heman for afib management and evaluation of palpitations  ? TONSILLECTOMY    ? TOTAL HIP ARTHROPLASTY    ? right  ? ? ?Current Outpatient Medications  ?Medication Instructions  ? aspirin 325 MG tablet aspirin 325 mg tablet ? Take 1 tablet every day by oral route.  ? clobetasol ointment (TEMOVATE) 0.05 % APPLY TO AFFECTED AREA AFTER BATHING. (GENERIC PLEASE)  ? diltiazem (CARDIZEM CD) 120 MG 24 hr capsule TAKE 1 CAPSULE(120 MG) BY MOUTH DAILY  ? Multiple Vitamin (MULTIVITAMIN PO) Oral  ? STIOLTO RESPIMAT 2.5-2.5 MCG/ACT AERS 2 puffs, Inhalation, Daily  ? tiZANidine (ZANAFLEX) 4 mg, Oral, Every 6 hours PRN  ? ? ?   ?Objective:  ?  Physical Exam ?BP 128/80 (BP Location: Left Arm, Patient Position: Sitting, Cuff Size: Small)   Pulse 81   Temp 98.5 ?F (36.9 ?C) (Oral)   Resp 16   Ht 6' (1.829 m)   Wt 209 lb 8 oz (95 kg)   SpO2 95%   BMI 28.41 kg/m?  ?General:   ?Well developed, NAD, BMI noted. ?HEENT:  ?Normocephalic . Face symmetric, atraumatic ?Lungs:  ?CTA B ?Normal respiratory effort, no intercostal retractions, no accessory muscle use. ?Heart: RRR,  no murmur.  ?Lower extremities: no pretibial edema bilaterally  ?Skin: Not pale. Not jaundice ?Neurologic:  ?alert & oriented X3.  ?Speech normal, gait appropriate for age and unassisted ?Psych--  ?Cognition and judgment appear intact.  ?Cooperative with normal attention span and concentration.  ?Behavior appropriate. ?No anxious or depressed appearing.  ? ?   ?Assessment   ? ?ASSESSMENT ?P- Atrial fibrillation: ?Ablation remotely, symptoms resurfaced 2021, loop implantation,  started Xarelto 09-2019, had an ablation April 2021.  Xarelto DC 02-2020 ?Tobacco abuse ?Erectile dysfunction ?TIA 2004   ?Emphysema per CT 01-2021 ?+ FH CAD Father  ? ?PLAN ?Paroxysmal A-fib: On aspirin, asymptomatic, check BMP ?Profound fatigue, myalgias, arthralgias: See LOV, overall much improved.  Able to walk 2.5 miles twice daily . 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 ?DJD: See comments above, denies generalized  arthralgias rather pain is focalize at the knees and R shoulder,  has plans to get a shoulder replacement. ?Interstitial lung disease, emphysema, lung nodule ?08-2021 saw pulmonary and had a high resolution CT chest ( Waxing and waning patchy regions of consolidation and groundglass opacity in both lungs. One  aarea with nodular morphology, radiology recommend recheck CT in 3 m), had Pet Scan , was reassuring, plans is to get a CT ~ 12-2021. ?Preventive care:  Recommend Tdap, Shingrix.  Had bivalent covid vax >>>  bivalent  booster is a  consideration d/t age > 19 y/o ?RTC 05-2022 CPX ?  ?

## 2021-11-18 NOTE — Patient Instructions (Addendum)
Recommend to proceed with the following vaccines at your pharmacy: ?Tdap (tetanus) ?Shingrix (shingles) ? ?Also COVID-vaccine is an option. ? ?Check the  blood pressure regularly ?BP GOAL is between 110/65 and  135/85. ?If it is consistently higher or lower, let me know ? ?  ? ?GO TO THE LAB : Get the blood work   ? ? ?[place], [place] ?Come back for a physical exam by November 2023 ?

## 2021-11-18 NOTE — Assessment & Plan Note (Signed)
Paroxysmal A-fib: On aspirin, asymptomatic, check BMP ?Profound fatigue, myalgias, arthralgias: See LOV, overall much improved.  Able to walk 2.5 miles twice daily . 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 ?DJD: See comments above, denies generalized arthralgias rather pain is focalize at the knees and R shoulder,  has plans to get a shoulder replacement. ?Interstitial lung disease, emphysema, lung nodule ?08-2021 saw pulmonary and had a high resolution CT chest ( Waxing and waning patchy regions of consolidation and groundglass opacity in both lungs. One  aarea with nodular morphology, radiology recommend recheck CT in 3 m), had Pet Scan , was reassuring, plans is to get a CT ~ 12-2021. ?Preventive care:  Recommend Tdap, Shingrix.  Had bivalent covid vax >>>  bivalent  booster is a consideration d/t age > 55 y/o ?RTC 05-2022 CPX ?  ?

## 2021-11-26 DIAGNOSIS — M25562 Pain in left knee: Secondary | ICD-10-CM | POA: Diagnosis not present

## 2021-11-28 ENCOUNTER — Encounter: Payer: Self-pay | Admitting: Pulmonary Disease

## 2021-11-28 ENCOUNTER — Ambulatory Visit: Payer: Medicare Other | Admitting: Pulmonary Disease

## 2021-11-28 VITALS — BP 132/78 | HR 74 | Temp 98.3°F | Ht 72.0 in | Wt 210.6 lb

## 2021-11-28 DIAGNOSIS — J302 Other seasonal allergic rhinitis: Secondary | ICD-10-CM | POA: Diagnosis not present

## 2021-11-28 DIAGNOSIS — R911 Solitary pulmonary nodule: Secondary | ICD-10-CM | POA: Diagnosis not present

## 2021-11-28 DIAGNOSIS — J849 Interstitial pulmonary disease, unspecified: Secondary | ICD-10-CM

## 2021-11-28 MED ORDER — FLUTICASONE PROPIONATE 50 MCG/ACT NA SUSP
1.0000 | Freq: Every day | NASAL | 2 refills | Status: DC | PRN
Start: 2021-11-28 — End: 2022-11-20

## 2021-11-28 MED ORDER — AZELASTINE HCL 0.15 % NA SOLN: 1.0000 | Freq: Two times a day (BID) | NASAL | Status: DC | PRN

## 2021-11-28 NOTE — Progress Notes (Signed)
[facility]  Chief Complaint  Patient presents with   Follow-up   Past Surgical History:  He  has a past surgical history that includes Total hip arthroplasty; Tonsillectomy; A flutter ablation; implantable loop recorder insertion (09/11/2019); and ATRIAL FIBRILLATION ABLATION (N/A, 11/07/2019).  Past Medical History:  Colon polyps, ED, PAF, Psoriasis, TIA 2004, PNA  Constitutional:  BP 132/78 (BP Location: Left Arm, Cuff Size: Normal)   Pulse 74   Temp 98.3 F (36.8 C) (Oral)   Ht 6' (1.829 m)   Wt 210 lb 9.6 oz (95.5 kg)   SpO2 98%   BMI 28.56 kg/m   Brief Summary:  Lawrence Rivas is a 71 y.o. male smoker with dyspnea from steroid responsive pneumonitis with symptoms starting after viral respiratory infection in April 2022.      Subjective:   He is here with his wife.  He had CT chest in February.  Showed new nodule.  PET scan showed this decreased in size, but then had new RLL nodule.    He walks 5 to 6 miles per day.  Has sinus congestion, drainage, and cough.  Mostly in the morning.  Not having chest congestion, chest pain, or wheezing.  Physical Exam:   Appearance - well kempt   ENMT - no sinus tenderness, no oral exudate, no LAN, Mallampati 3 airway, no stridor  Respiratory - equal breath sounds bilaterally, no wheezing or rales  CV - s1s2 regular rate and rhythm, no murmurs  Ext - no clubbing, no edema  Skin - no rashes  Psych - normal mood and affect     Pulmonary testing:  Serology 01/21/21 >> ANA, RF, ANCA negative; ESR 98 PFT 03/19/21 >> FEV1 3.09 (88%), FEV1% 74, TLC 6.71 (90%), DLCO 81%  Chest Imaging:  CT chest 02/03/21 >> atherosclerosis; centrilobular emphysema; patchy areas of GGO, consolidation an BTX (suggestive of post COVID sequelae) HRCT chest 08/18/21 >> atherosclerosis, decreased consolidation and GGO but still mild residual GGO and reticulation, new irregular nodular focus of  consolidation 2.4 x 1.5 cm RLL, new 0.4 cm nodule RLL, scattered areas of cylindrical BTX b/l (suggestive of COP) PET scan 10/02/21 >> new nodular opacity RML 1.8 x 1.1 cm, RLL nodule decreased to 1.0 x 0.6 cm  Cardiac Tests:  Echo 01/01/21 >> EF 50 to 55%, mild LVH  Social History:  He  reports that he has been smoking cigarettes. He started smoking about 51 years ago. He has a 25.00 pack-year smoking history. He has never used smokeless tobacco. He reports current alcohol use. He reports that he does not use drugs.  Family History:  His family history includes Coronary artery disease (age of onset: 31) in his father; Diabetes in his maternal grandmother; Healthy in his son; Stroke in his mother; Uterine cancer in his mother.     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 - much improved - he has mild residual scarring and bronchiectasis - monitor clinically  Rt lower lung nodule. - will arrange for follow up CT chest without contrast in July 2023  Tobacco abuse with centrilobular emphysema. - he will try to gradual quit on his own - will seen how he does off of stiolto  Upper airway cough with post nasal drip. - discussed environmental control techniques - prn nasal irrigation, flonase, astepro, breath rite strips  Joint pain. - followed by Dr. Bo Merino with rheumatology  Time Spent Involved in  Patient Care on Day of Examination:  37 minutes  Follow up:   Patient Instructions  Okay to stop using stiolto  You can try nasal saline rinse, flonase, and/or astepro to help with sinus congestion and post nasal drainage  You can try using breath rite nasal strips at night  Will arrange for CT chest in July 2023 and follow up after this  Medication List:   Allergies as of 11/28/2021       Reactions   Watermelon [citrullus Vulgaris] Itching   Any melon   Coconut (cocos Nucifera) Hives        Medication  List        Accurate as of Nov 28, 2021  9:58 AM. If you have any questions, ask your nurse or doctor.          STOP taking these medications    Stiolto Respimat 2.5-2.5 MCG/ACT Aers Generic drug: Tiotropium Bromide-Olodaterol Stopped by: Chesley Mires, MD       TAKE these medications    aspirin 325 MG tablet aspirin 325 mg tablet  Take 1 tablet every day by oral route.   Azelastine HCl 0.15 % Soln Commonly known as: Astepro Place 1 spray into the nose 2 (two) times daily as needed (Sinus congestion, allergies). Started by: Chesley Mires, MD   clobetasol ointment 0.05 % Commonly known as: TEMOVATE APPLY TO AFFECTED AREA AFTER BATHING. (GENERIC PLEASE)   diltiazem 120 MG 24 hr capsule Commonly known as: CARDIZEM CD TAKE 1 CAPSULE(120 MG) BY MOUTH DAILY   fluticasone 50 MCG/ACT nasal spray Commonly known as: FLONASE Place 1 spray into both nostrils daily as needed for allergies or rhinitis. Started by: Chesley Mires, MD   MULTIVITAMIN PO Take by mouth.        Signature:  Chesley Mires, MD [place] Pager - [phone] 11/28/2021, 9:58 AM

## 2021-11-28 NOTE — Patient Instructions (Signed)
Okay to stop using stiolto  You can try nasal saline rinse, flonase, and/or astepro to help with sinus congestion and post nasal drainage  You can try using breath rite nasal strips at night  Will arrange for CT chest in July 2023 and follow up after this

## 2021-12-01 ENCOUNTER — Ambulatory Visit (INDEPENDENT_AMBULATORY_CARE_PROVIDER_SITE_OTHER): Payer: Medicare Other

## 2021-12-01 DIAGNOSIS — I480 Paroxysmal atrial fibrillation: Secondary | ICD-10-CM | POA: Diagnosis not present

## 2021-12-03 LAB — CUP PACEART REMOTE DEVICE CHECK
Date Time Interrogation Session: 20230524091348
Implantable Pulse Generator Implant Date: 20210301

## 2021-12-17 NOTE — Progress Notes (Signed)
Carelink Summary Report / Loop Recorder 

## 2021-12-31 ENCOUNTER — Telehealth: Payer: Self-pay | Admitting: Pulmonary Disease

## 2021-12-31 NOTE — Telephone Encounter (Signed)
Patient has a CT Scan scheduled for 01/23/2022 and received a letter yesterday from [facility] stating that Rexene Edison, NP is no longer a HMO provider with [facility] as of 6/14. Informed patient that Dr. Halford Chessman ordered the CT and that our team has gotten an authorization prior to scheduling. I informed the patient that I would look into the credentialing of TP and have the patient care coordinators confirm that the authorization for the CT is there.  PCC's please confirm authorization and inform patient.

## 2022-01-01 NOTE — Telephone Encounter (Signed)
LM on pt's VM to advise Lawrence Rivas has been obtained for CT scheduled for 7/14.  Nothing further needed.

## 2022-01-01 NOTE — Telephone Encounter (Signed)
I will work on [place] today.

## 2022-01-05 ENCOUNTER — Ambulatory Visit (INDEPENDENT_AMBULATORY_CARE_PROVIDER_SITE_OTHER): Payer: Medicare Other

## 2022-01-05 DIAGNOSIS — I480 Paroxysmal atrial fibrillation: Secondary | ICD-10-CM | POA: Diagnosis not present

## 2022-01-06 LAB — CUP PACEART REMOTE DEVICE CHECK
Date Time Interrogation Session: 20230621231210
Implantable Pulse Generator Implant Date: 20210301

## 2022-01-20 ENCOUNTER — Other Ambulatory Visit (HOSPITAL_BASED_OUTPATIENT_CLINIC_OR_DEPARTMENT_OTHER): Payer: Medicare Other

## 2022-01-23 ENCOUNTER — Ambulatory Visit (HOSPITAL_BASED_OUTPATIENT_CLINIC_OR_DEPARTMENT_OTHER)
Admission: RE | Admit: 2022-01-23 | Discharge: 2022-01-23 | Disposition: A | Discharge status: Home / Self Care | Payer: Medicare Other | Source: Ambulatory Visit | Attending: Pulmonary Disease | Admitting: Pulmonary Disease

## 2022-01-23 DIAGNOSIS — J439 Emphysema, unspecified: Secondary | ICD-10-CM | POA: Diagnosis not present

## 2022-01-23 DIAGNOSIS — R911 Solitary pulmonary nodule: Secondary | ICD-10-CM | POA: Diagnosis not present

## 2022-01-26 ENCOUNTER — Telehealth: Payer: Self-pay | Admitting: Pulmonary Disease

## 2022-01-26 NOTE — Telephone Encounter (Signed)
Returned patients call and scheduled him in [place] for appt with [facility] NP to discuss CT results. Nothing further needed at this time.

## 2022-01-29 NOTE — Progress Notes (Signed)
Carelink Summary Report / Loop Recorder 

## 2022-01-30 ENCOUNTER — Ambulatory Visit: Payer: Medicare Other | Admitting: Nurse Practitioner

## 2022-01-30 ENCOUNTER — Encounter: Payer: Self-pay | Admitting: Nurse Practitioner

## 2022-01-30 VITALS — BP 126/78 | HR 90 | Ht 72.0 in | Wt 199.4 lb

## 2022-01-30 DIAGNOSIS — J849 Interstitial pulmonary disease, unspecified: Secondary | ICD-10-CM

## 2022-01-30 DIAGNOSIS — J432 Centrilobular emphysema: Secondary | ICD-10-CM | POA: Diagnosis not present

## 2022-01-30 DIAGNOSIS — J302 Other seasonal allergic rhinitis: Secondary | ICD-10-CM

## 2022-01-30 DIAGNOSIS — L409 Psoriasis, unspecified: Secondary | ICD-10-CM | POA: Diagnosis not present

## 2022-01-30 DIAGNOSIS — Z720 Tobacco use: Secondary | ICD-10-CM

## 2022-01-30 LAB — CBC WITH DIFFERENTIAL/PLATELET
Basophils Absolute: 0.1 10*3/uL (ref 0.0–0.1)
Basophils Relative: 1.2 % (ref 0.0–3.0)
Eosinophils Absolute: 0.1 10*3/uL (ref 0.0–0.7)
Eosinophils Relative: 1.3 % (ref 0.0–5.0)
HCT: 42.7 % (ref 39.0–52.0)
Hemoglobin: 14.4 g/dL (ref 13.0–17.0)
Lymphocytes Relative: 29.9 % (ref 12.0–46.0)
Lymphs Abs: 1.9 10*3/uL (ref 0.7–4.0)
MCHC: 33.7 g/dL (ref 30.0–36.0)
MCV: 101.8 fl — ABNORMAL HIGH (ref 78.0–100.0)
Monocytes Absolute: 0.7 10*3/uL (ref 0.1–1.0)
Monocytes Relative: 11 % (ref 3.0–12.0)
Neutro Abs: 3.6 10*3/uL (ref 1.4–7.7)
Neutrophils Relative %: 56.6 % (ref 43.0–77.0)
Platelets: 246 10*3/uL (ref 150.0–400.0)
RBC: 4.19 Mil/uL — ABNORMAL LOW (ref 4.22–5.81)
RDW: 13.8 % (ref 11.5–15.5)
WBC: 6.4 10*3/uL (ref 4.0–10.5)

## 2022-01-30 LAB — SEDIMENTATION RATE: Sed Rate: 21 mm/hr — ABNORMAL HIGH (ref 0–20)

## 2022-01-30 NOTE — Assessment & Plan Note (Signed)
Stable and unchanged from his baseline.  Continue azelastine and Flonase for postnasal drainage control and over-the-counter allergy medicine as needed for allergies.

## 2022-01-30 NOTE — Progress Notes (Signed)
'@Patient'  ID: Lawrence Rivas, male    DOB: [birth year], 71 y.o.   MRN: [id number]  Chief Complaint  Patient presents with   Follow-up    Referring provider: Colon Branch, MD  HPI: 71 year old male, current every day smoker (25 pack years) followed for emphysema and post viral pneumonitis. He is a patient of Dr. Juanetta Rivas and last seen in office on 11/28/2021. Past medical history significant for a fib, atherosclerosis, allergic rhinitis, psoriasis (never confirmed by bx), tobacco abuse, ED, osteoarthritis.  He is followed by rheumatology and cardiology.  His recent work-up by rheumatology was unremarkable; suspected his joint pains were related to severe osteoarthritis.  TEST/EVENTS:  02/03/2021 CT chest without contrast: Atherosclerosis.  High right paratracheal lymph node measures 10 mm.  Mediastinal lymph nodes are subcentimeter in short axis.  Subcarinal lymph node measures 12 mm.  Centrilobular emphysema.  Patchy areas of parenchymal groundglass consolidation, bronchiectasis and architectural distortion bilaterally 03/19/2021 PFTs: FVC 4.2 (88), FEV1 3.09 (88), ratio 73, TLC 90%, DLCOcor 93%.  No BD. Overall normal spirometry and diffusion capacity 08/18/2021 HRCT chest: Mildly enlarged 1.1 cm subcarinal node, stable.  Previously visualized bandlike regions of consolidation and groundglass opacity throughout both lungs, substantially decreased with some residual patchy groundglass opacities and reticulation in these locations, possibly scarring.  Some new thick curvilinear bandlike focus of consolidation in the lingula.  New irregular nodular consolidation in anterior right lower lobe measuring 2.4 x 1.5 cm.  New small subpleural solid 0.4 cm pulmonary nodule in posterior right lower lobe.  Some areas of mild groundglass opacities appear new in right upper lobe and posterior peripheral left upper lobe when compared to previous.  Mild cylindrical bronchiectasis in both lungs have improved.   Atherosclerosis present. 09/30/2021 PET scan: New irregular nodular opacity in the right middle lobe demonstrates low-level hypermetabolism.  Likely infectious/inflammatory.  The area of concern in the right lower lobe on previous scans shows interval evolution and is less confluent today with smaller nodular component on CT imaging.  No associated hypermetabolism.  No other areas of hypermetabolism noted. 01/23/2022 CT chest without contrast: Atherosclerosis.  Unchanged prominent mediastinal lymph nodes.  No overt LAD.  Mild centrilobular emphysema.  Diffuse bilateral bronchial wall thickening.  Multiple waxing and waning bilateral heterogeneous groundglass and consolidative airspace opacities are again seen throughout the lungs, several previous lesions have improved however with multiple new lesions bilaterally and overall worsening of findings.  There is a new nodular index opacity of the right lower lobe which appears to demonstrate subtle internal clearing or cavitation and measures 3.1 x 2 cm.  More amorphous heterogeneous opacity of the anterior right upper lobe measures 3.9 x 2.3 cm.  Asymmetric thickening hypodensity and fat stranding involving the right subscapularis musculature, nonspecific and of uncertain significance.  09/01/2021: OV with Lawrence Shiffman NP for follow-up after high-resolution CT scan.  He reports that his breathing has been stable and he has noticed an improvement in his activity tolerance.  He is able to complete his normal 6 miles a day versus 1 mile a day.  He has noticed an improvement in his stamina as well and no longer requiring a nap midday.  He does have some allergy type symptoms with postnasal drip and associated cough, occurring only in AM with clear sputum. Describes it as a "clearing cough". He states this is normal for this time of year when things begin to blood. He denies infectious symptoms such as fevers, chills, purulent sputum or persistent  productive cough. He denies  orthopnea, PND, chest pain or lower extremity swelling. No anorexia, unintentional weight loss, or hemoptysis. He continues on Stilto daily, but is unsure if it has made a huge impact on his breathing or not. He does feel better than he did at his last visit, from a breathing standpoint.  Discussed trial off of Stiolto versus continuing -patient opted to continue using.  Continues to smoke daily with no desire to quit -smoking cessation advised.  Advised that he start Zyrtec or Claritin over-the-counter for allergies and restart Flonase nasal spray.  He was evaluated by rheumatology in November due to abnormal CT and joint pain. Possible psoriatic arthritis given fluctuating ESR and hx vs osteoarthritis. No psoriasis plaques or other systemic symptoms noted.  Most recent HRCT showed a new 2.4 x 1.5 cm irregular nodule and solid 4 mm nodule.  Inflammatory versus malignancy -PET scan ordered for further evaluation.  11/28/2021: OV with Dr. Halford Rivas.  CT chest in February showed a new nodule.  PET scan in April showed this had decreased in size but then had a new right lower lobe nodule.  Plan to repeat CT in July 2023.  Breathing overall stable.  HRCT showed some mild residual scarring and bronchiectasis.  Advised to monitor clinically.  Trialed off Stiolto.  01/30/2022: Today-follow-up Patient presents today with his wife for follow-up after repeat CT scan.  CT overall looks worse with increased areas of groundglass and consolidative airspace opacities throughout the lungs, which has been waxing and waning for some time now.  He has diffuse bronchial wall thickening.  There is a new nodular index opacity in the right lower lobe measuring 3.1 x 2 cm and a anterior right upper lobe measuring 3.9 x 2.3 cm.  Today, we discussed his CT scan results.  He overall feels well.  Breathing is stable.  Has not noticed any difference off Stiolto.  He walks about 6 miles a day and exercises frequently.  He does not feel like his  CT scan reflects his actual lung function.  His cough is unchanged from his baseline.  It is usually productive in the morning and white in color.  He does continue to have postnasal drip, which is unchanged and he feels like contributes to his cough.  He denies any fevers, night sweats, hemoptysis, unintentional weight loss, anorexia.  He has chronic joint pain which is no worse and does not limit his activity.  Allergies  Allergen Reactions   Watermelon [Citrullus Vulgaris] Itching    Any melon   Coconut (Cocos Nucifera) Hives    Immunization History  Administered Date(s) Administered   Fluad Quad(high Dose 65+) 04/01/2020, 04/28/2021   Influenza Split 05/01/2014   Influenza,inj,Quad PF,6+ Mos 06/05/2013   Influenza,inj,quad, With Preservative 04/13/2019   Influenza-Unspecified 05/17/2016   PFIZER(Purple Top)SARS-COV-2 Vaccination 07/29/2019, 08/19/2019, 10/22/2020   Pfizer Covid-19 Vaccine Bivalent Booster 77yr & up 05/26/2021   Pneumococcal Conjugate-13 04/01/2020   Pneumococcal Polysaccharide-23 06/18/2011   Tdap 06/18/2011   Zoster, Live 06/05/2013    Past Medical History:  Diagnosis Date   Adenomatous Lawrence polyp    TA and SS   COPD (chronic obstructive pulmonary disease) (HCC)    Erectile dysfunction    Numbness and tingling    right arm    Paroxysmal A-fib (HCC)    no recurrence s/p atrial flutter ablation (previously atrial flutter felt to degenerate into afib)   Psoriasis    TIA (transient ischemic attack) 2004    Tobacco History:  Social History   Tobacco Use  Smoking Status Every Day   Packs/day: 0.50   Years: 50.00   Total pack years: 25.00   Types: Cigarettes   Start date: 40  Smokeless Tobacco Never  Tobacco Comments   < 1/2 pack a day as of 05-2021    Ready to quit: Not Answered Counseling given: Not Answered Tobacco comments: < 1/2 pack a day as of 05-2021    Outpatient Medications Prior to Visit  Medication Sig Dispense Refill   aspirin  325 MG tablet aspirin 325 mg tablet  Take 1 tablet every day by oral route.     Azelastine HCl (ASTEPRO) 0.15 % SOLN Place 1 spray into the nose 2 (two) times daily as needed (Sinus congestion, allergies).     clobetasol ointment (TEMOVATE) 0.05 % APPLY TO AFFECTED AREA AFTER BATHING. (GENERIC PLEASE) 60 g 1   diltiazem (CARDIZEM CD) 120 MG 24 hr capsule TAKE 1 CAPSULE(120 MG) BY MOUTH DAILY 90 capsule 3   fluticasone (FLONASE) 50 MCG/ACT nasal spray Place 1 spray into both nostrils daily as needed for allergies or rhinitis. 16 g 2   Multiple Vitamin (MULTIVITAMIN PO) Take by mouth.     No facility-administered medications prior to visit.     Review of Systems:   Constitutional: No weight loss or gain, night sweats, fevers, chills, fatigue, or lassitude. HEENT: No headaches, difficulty swallowing, tooth/dental problems, or sore throat. No sneezing, itching, ear ache, nasal congestion, or post nasal drip CV:  No chest pain, orthopnea, PND, swelling in lower extremities, anasarca, dizziness, palpitations, syncope Resp: No significant shortness of breath with exertion.  Morning only productive cough with clear to white sputum (attributes to postnasal drip and unchanged). No excess mucus or change in color of mucus. No hemoptysis. No wheezing.  No chest wall deformity GI:  No heartburn, indigestion, abdominal pain, nausea, vomiting, diarrhea, change in bowel habits, loss of appetite, bloody stools.  GU: No dysuria, change in color of urine, urgency or frequency.  No flank pain, no hematuria  Skin: No rash, lesions, ulcerations MSK:  No joint swelling.  No decreased range of motion.  +chronic joint pain and back pain (unchanged) Neuro: No dizziness or lightheadedness.  Psych: No depression or anxiety. Mood stable.     Physical Exam:  BP 126/78 (BP Location: Right Arm, Cuff Size: Normal)   Pulse 90   Ht 6' (1.829 m)   Wt 199 lb 6.4 oz (90.4 kg)   SpO2 97%   BMI 27.04 kg/m   GEN:  Pleasant, interactive, well-appearing; in no acute distress. HEENT:  Normocephalic and atraumatic.  PERRLA. Sclera white. Nasal turbinates pink, moist and patent bilaterally. No rhinorrhea present. Oropharynx pink and moist, without exudate or edema. No lesions, ulcerations NECK:  Supple w/ fair ROM. No JVD present. Normal carotid impulses w/o bruits. Thyroid symmetrical with no goiter or nodules palpated. No lymphadenopathy.   CV: RRR, no m/r/g, no peripheral edema. Pulses intact, +2 bilaterally. No cyanosis, pallor or clubbing. PULMONARY:  Unlabored, regular breathing.  Diminished bases, otherwise clear bilaterally A&P w/o wheezes/rales/rhonchi. No accessory muscle use. No dullness to percussion. GI: BS present and normoactive. Soft, non-tender to palpation. No organomegaly or masses detected. No CVA tenderness. MSK: No erythema, warmth or tenderness. Cap refil <2 sec all extrem. No deformities or joint swelling noted.  Neuro: A/Ox3. No focal deficits noted.   Skin: Warm, no lesions or rashe Psych: Normal affect and behavior. Judgement and thought content appropriate.  Lab Results:  CBC    Component Value Date/Time   WBC 7.6 05/13/2021 1039   RBC 4.22 05/13/2021 1039   HGB 13.8 05/13/2021 1039   HGB 13.9 10/31/2019 1351   HCT 40.8 05/13/2021 1039   HCT 40.8 10/31/2019 1351   PLT 354 05/13/2021 1039   PLT 206 10/31/2019 1351   MCV 96.7 05/13/2021 1039   MCV 100 (H) 10/31/2019 1351   MCH 32.7 05/13/2021 1039   MCHC 33.8 05/13/2021 1039   RDW 14.0 05/13/2021 1039   RDW 13.4 10/31/2019 1351   LYMPHSABS 2,082 05/13/2021 1039   LYMPHSABS 2.1 10/31/2019 1351   MONOABS 0.9 01/21/2021 1447   EOSABS 129 05/13/2021 1039   EOSABS 0.1 10/31/2019 1351   BASOSABS 61 05/13/2021 1039   BASOSABS 0.0 10/31/2019 1351    BMET    Component Value Date/Time   NA 135 11/18/2021 0950   NA 134 10/31/2019 1351   K 4.8 11/18/2021 0950   CL 99 11/18/2021 0950   CO2 27 11/18/2021 0950    GLUCOSE 95 11/18/2021 0950   BUN 16 11/18/2021 0950   BUN 21 10/31/2019 1351   CREATININE 0.71 11/18/2021 0950   CREATININE 0.66 (L) 05/13/2021 1039   CALCIUM 9.2 11/18/2021 0950   GFRNONAA 87 10/31/2019 1351   GFRAA 101 10/31/2019 1351    BNP No results found for: "BNP"   Imaging:  CT Chest Wo Contrast  Result Date: 01/24/2022 CLINICAL DATA:  Follow-up lung nodules EXAM: CT CHEST WITHOUT CONTRAST TECHNIQUE: Multidetector CT imaging of the chest was performed following the standard protocol without IV contrast. RADIATION DOSE REDUCTION: This exam was performed according to the departmental dose-optimization program which includes automated exposure control, adjustment of the mA and/or kV according to patient size and/or use of iterative reconstruction technique. COMPARISON:  PET-CT, 09/30/2021, CT chest, 08/18/2021 FINDINGS: Cardiovascular: Aortic atherosclerosis. Normal heart size. Three-vessel coronary artery calcifications. No pericardial effusion. Mediastinum/Nodes: Unchanged prominent mediastinal lymph nodes. No overt lymphadenopathy. Thyroid gland, trachea, and esophagus demonstrate no significant findings. Lungs/Pleura: Mild centrilobular emphysema. Diffuse bilateral bronchial wall thickening. Multiple waxing and waning bilateral heterogeneous ground-glass and consolidative airspace opacities are again seen throughout the lungs, with several previously seen lesions improved, for example in the lingula (series 3, image 92), however with multiple new lesions bilaterally and overall worsening of findings. New nodular index opacity of the right lower lobe appears to demonstrate subtle internal clearing or cavitation and measures 3.1 x 2.0 cm (series 3, image 116). More amorphous heterogeneous opacity of the anterior right upper lobe measures 3.9 x 2.3 cm (series 3, image 79). No pleural effusion or pneumothorax. Upper Abdomen: No acute abnormality. Musculoskeletal: Asymmetric thickening  hypodensity and fat stranding involving the right subscapularis musculature (series 2, image 33). No suspicious osseous lesions identified. Left chest implantable loop recorder. IMPRESSION: 1. Multiple waxing and waning bilateral heterogeneous ground-glass and consolidative airspace opacities are again seen throughout the lungs, with several previously seen lesions improved, however with multiple new lesions bilaterally and overall worsening of findings. In particular, a new nodular index opacity of the right lower lobe appears to demonstrate subtle internal clearing or cavitation. Other more amorphous opacities are present bilaterally. Findings are consistent with ongoing infection or inflammation, general differential considerations wide, although including uncommon etiologies such as vasculitis. Malignancy and metastatic disease generally not favored given fluctuant behavior over multiple prior examinations. 2. Asymmetric thickening, hypodensity and fat stranding involving the right subscapularis musculature, nonspecific and of uncertain significance, possibly related to trauma.  Soft tissue involvement of the above-described pulmonary process is in general a differential consideration. 3. Emphysema and diffuse bilateral bronchial wall thickening. 4. Coronary artery disease. Aortic Atherosclerosis (ICD10-I70.0) and Emphysema (ICD10-J43.9). Electronically Signed   By: Delanna Ahmadi M.D.   On: 01/24/2022 20:27   CUP PACEART REMOTE DEVICE CHECK  Result Date: 01/06/2022 ILR summary report received. Battery status OK. Normal device function. No new symptom, tachy, brady, or pause episodes. No new AF episodes. 0% AF burden.  Monthly summary reports and ROV/PRN Kathy Breach, RN, CCDS, [employer]        Latest Ref Rng & Units 03/19/2021    8:40 AM  PFT Results  FVC-Pre L 4.18   FVC-Predicted Pre % 87   FVC-Post L 4.20   FVC-Predicted Post % 88   Pre FEV1/FVC % % 74   Post FEV1/FCV % % 73    FEV1-Pre L 3.09   FEV1-Predicted Pre % 88   FEV1-Post L 3.06   DLCO uncorrected ml/min/mmHg 22.47   DLCO UNC% % 81   DLCO corrected ml/min/mmHg 25.85   DLCO COR %Predicted % 93   DLVA Predicted % 104   TLC L 6.71   TLC % Predicted % 90   RV % Predicted % 99     No results found for: "NITRICOXIDE"      Assessment & Plan:   Interstitial pulmonary disease (HCC) Respiratory status is stable and he has no significant symptom burden.  Interval worsening on recent CT; patient denies any changes in his symptoms.  Could be possible autoimmune inflammatory or chronic infectious process.  Unable to entirely rule out malignancy; however constellation of findings is not consistent with this.  Previously discussed results with Dr. Halford Rivas.  He will likely need bronchoscopy for further evaluation, which we discussed today.  He is open to this but not entirely sure that he wants to move forward with it at this point.  He would like to wait on his labs to come back first.  Check CBC with differential, ESR, ANA, RA factor and ANCA today.  We will also obtain sputum culture, fungal culture and AFB.  Advised that it can take up to 6 weeks to get final fungal cultures back.  If he decides to move forward with bronchoscopy, we will set him up with Dr. Valeta Harms or Dr. Lamonte Sakai.  Patient Instructions  Continue flonase nasal spray 1-2 sprays each nostril daily for nasal congestion/allergies Continue Azelastine 1 spray Twice daily as needed for sinus congestion or allergies   Labs today   Return sputum cultures as directed  I have attached information on bronchoscopy procedure to this for you to review. We can discuss when I call you about your lab results.   Follow up in 4-6 weeks with Dr. Halford Rivas or Alanson Aly. If symptoms do not improve or worsen, please contact office for sooner follow up or seek emergency care.     Emphysema lung (HCC) Mild emphysema on CT imaging.  Previous PFTs without formal diagnosis  of obstruction.  No significant response to Stiolto.  He is not currently on any maintenance therapy and does not use albuterol.  Allergic rhinitis Stable and unchanged from his baseline.  Continue azelastine and Flonase for postnasal drainage control and over-the-counter allergy medicine as needed for allergies.  Psoriasis Followed by rheumatology. Concern for psoriatic arthritis vs osteoarthritis. No evidence of psoriatic plaques or other systemic findings on most recent exam.   Waxing and waning ESR levels in  the past.  Recheck today.   Tobacco abuse No desire to quit.  Smokes anywhere between half pack to three quarters of a pack a day.  Smoking cessation advised.    Lawrence Bibles, NP 01/30/2022  Pt aware and understands NP's role.

## 2022-01-30 NOTE — Assessment & Plan Note (Signed)
No desire to quit.  Smokes anywhere between half pack to three quarters of a pack a day.  Smoking cessation advised.

## 2022-01-30 NOTE — Assessment & Plan Note (Signed)
Respiratory status is stable and he has no significant symptom burden.  Interval worsening on recent CT; patient denies any changes in his symptoms.  Could be possible autoimmune inflammatory or chronic infectious process.  Unable to entirely rule out malignancy; however constellation of findings is not consistent with this.  Previously discussed results with Dr. Halford Chessman.  He will likely need bronchoscopy for further evaluation, which we discussed today.  He is open to this but not entirely sure that he wants to move forward with it at this point.  He would like to wait on his labs to come back first.  Check CBC with differential, ESR, ANA, RA factor and ANCA today.  We will also obtain sputum culture, fungal culture and AFB.  Advised that it can take up to 6 weeks to get final fungal cultures back.  If he decides to move forward with bronchoscopy, we will set him up with Dr. Valeta Harms or Dr. Lamonte Sakai.  Patient Instructions  Continue flonase nasal spray 1-2 sprays each nostril daily for nasal congestion/allergies Continue Azelastine 1 spray Twice daily as needed for sinus congestion or allergies   Labs today   Return sputum cultures as directed  I have attached information on bronchoscopy procedure to this for you to review. We can discuss when I call you about your lab results.   Follow up in 4-6 weeks with Dr. Halford Chessman or Alanson Aly. If symptoms do not improve or worsen, please contact office for sooner follow up or seek emergency care.

## 2022-01-30 NOTE — Assessment & Plan Note (Signed)
Followed by rheumatology. Concern for psoriatic arthritis vs osteoarthritis. No evidence of psoriatic plaques or other systemic findings on most recent exam.   Waxing and waning ESR levels in the past.  Recheck today.

## 2022-01-30 NOTE — Patient Instructions (Signed)
Continue flonase nasal spray 1-2 sprays each nostril daily for nasal congestion/allergies Continue Azelastine 1 spray Twice daily as needed for sinus congestion or allergies   Labs today   Return sputum cultures as directed  I have attached information on bronchoscopy procedure to this for you to review. We can discuss when I call you about your lab results.   Follow up in 4-6 weeks with Dr. Halford Chessman or Alanson Aly. If symptoms do not improve or worsen, please contact office for sooner follow up or seek emergency care.

## 2022-01-30 NOTE — Assessment & Plan Note (Signed)
Mild emphysema on CT imaging.  Previous PFTs without formal diagnosis of obstruction.  No significant response to Stiolto.  He is not currently on any maintenance therapy and does not use albuterol.

## 2022-02-01 LAB — ANCA SCREEN W REFLEX TITER: ANCA SCREEN: NEGATIVE

## 2022-02-01 LAB — ANA: Anti Nuclear Antibody (ANA): NEGATIVE

## 2022-02-01 LAB — RHEUMATOID FACTOR: Rheumatoid fact SerPl-aCnc: <14 IU/mL (ref <14)

## 2022-02-02 ENCOUNTER — Other Ambulatory Visit: Payer: Medicare Other

## 2022-02-02 ENCOUNTER — Telehealth: Payer: Self-pay | Admitting: Nurse Practitioner

## 2022-02-02 DIAGNOSIS — J849 Interstitial pulmonary disease, unspecified: Secondary | ICD-10-CM | POA: Diagnosis not present

## 2022-02-02 NOTE — Telephone Encounter (Signed)
We will just have to see if they are adequate samples. Thanks!

## 2022-02-02 NOTE — Telephone Encounter (Signed)
Called patient today and he states that he is not able to produce a lot of sputum but he is dropping off what he was able to produce today. He states that he was in the car on the way to the clinic. But he wanted to let [facility] know.   Katie please advise

## 2022-02-03 ENCOUNTER — Other Ambulatory Visit: Payer: Medicare Other

## 2022-02-03 DIAGNOSIS — J849 Interstitial pulmonary disease, unspecified: Secondary | ICD-10-CM

## 2022-02-05 ENCOUNTER — Telehealth: Payer: Self-pay | Admitting: Nurse Practitioner

## 2022-02-05 LAB — CUP PACEART REMOTE DEVICE CHECK
Date Time Interrogation Session: 20230724231200
Implantable Pulse Generator Implant Date: 20210301

## 2022-02-05 NOTE — Progress Notes (Signed)
Please notify patient that his labs were all normal aside from his sedimentation rate, which was just borderline elevated (normal is 0-20 and his was 21). It was actually improved when compared to previous. We will wait on his sputum cultures and then we can discuss bronchoscopy at next visit, as previously discussed. Thanks!

## 2022-02-05 NOTE — Telephone Encounter (Signed)
Called patient and went over lab work results for patient. Patient verbalized understanding. Nothing further needed

## 2022-02-09 ENCOUNTER — Ambulatory Visit (INDEPENDENT_AMBULATORY_CARE_PROVIDER_SITE_OTHER): Payer: Medicare Other

## 2022-02-09 DIAGNOSIS — I480 Paroxysmal atrial fibrillation: Secondary | ICD-10-CM | POA: Diagnosis not present

## 2022-02-10 ENCOUNTER — Telehealth: Payer: Self-pay

## 2022-02-10 NOTE — Telephone Encounter (Signed)
LINQ alert received.  Device  reached RRT 7/30 Route to triage [place]  Successful telephone encounter to patient to discuss loop at RRT. Patient is advised to unplug bedside monitor. Address confirmed and return kit sent. Future remote appointments discontinued. Patient is marked inactive in paceart and discontinued in carelink. Patient will discuss loop removal with Dr. Rayann Heman at [place] appointment 02/12/22.

## 2022-02-12 ENCOUNTER — Encounter (HOSPITAL_BASED_OUTPATIENT_CLINIC_OR_DEPARTMENT_OTHER): Payer: Self-pay | Admitting: Internal Medicine

## 2022-02-12 ENCOUNTER — Ambulatory Visit (INDEPENDENT_AMBULATORY_CARE_PROVIDER_SITE_OTHER): Payer: Medicare Other | Admitting: Internal Medicine

## 2022-02-12 VITALS — BP 130/78 | HR 71 | Ht 72.0 in | Wt 202.0 lb

## 2022-02-12 DIAGNOSIS — I4892 Unspecified atrial flutter: Secondary | ICD-10-CM | POA: Diagnosis not present

## 2022-02-12 DIAGNOSIS — I480 Paroxysmal atrial fibrillation: Secondary | ICD-10-CM | POA: Diagnosis not present

## 2022-02-12 NOTE — Progress Notes (Signed)
Reviewed and agree with assessment/plan.   Chesley Mires, MD [facility] 02/12/2022, 9:53 AM Pager:  [phone]

## 2022-02-12 NOTE — Patient Instructions (Addendum)
Medication Instructions:  Your physician recommends that you continue on your current medications as directed. Please refer to the Current Medication list given to you today. *If you need a refill on your cardiac medications before your next appointment, please call your pharmacy*  Lab Work: None ordered. If you have labs (blood work) drawn today and your tests are completely normal, you will receive your results only by: [place] (if you have MyChart) OR A paper copy in the mail If you have any lab test that is abnormal or we need to change your treatment, we will call you to review the results.  Testing/Procedures: None ordered.  Follow-Up: At [facility], you and your health needs are our priority.  As part of our continuing mission to provide you with exceptional heart care, we have created designated Provider Care Teams.  These Care Teams include your primary Cardiologist (physician) and Advanced Practice Providers (APPs -  Physician Assistants and Nurse Practitioners) who all work together to provide you with the care you need, when you need it.  Your next appointment:   Your physician wants to see you on February 27, 2022, ( EARLY MORNING preferably )  regarding LOOP EXPLANT Visit at [facility] on [address].  You will receive a reminder letter in the mail two months in advance. If you don't receive a letter, please call our office to schedule the follow-up appointment.   Important Information About Sugar

## 2022-02-12 NOTE — Progress Notes (Signed)
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 male who presents today for routine electrophysiology followup.  Since last being seen in our clinic, the patient reports doing very well.  He has chronic lung disease with SOB for which he is seeing Dr Halford Chessman.  No further afib.  Today, he denies symptoms of palpitations, chest pain, lower extremity edema, dizziness, presyncope, or syncope.  The patient is otherwise without complaint today.   Past Medical History:  Diagnosis Date   Adenomatous colon polyp    TA and SS   COPD (chronic obstructive pulmonary disease) (HCC)    Erectile dysfunction    Numbness and tingling    right arm    Paroxysmal A-fib (HCC)    no recurrence s/p atrial flutter ablation (previously atrial flutter felt to degenerate into afib)   Psoriasis    TIA (transient ischemic attack) 2004   Past Surgical History:  Procedure Laterality Date   A FLUTTER ABLATION     March 2011 at [facility] by Dr Lyanne Co Dixat   ATRIAL FIBRILLATION ABLATION N/A 11/07/2019   Procedure: [place];  Surgeon: Thompson Grayer, MD;  Location: [place] [facility];  Service: Cardiovascular;  Laterality: N/A;   implantable loop recorder insertion  09/11/2019   Medtronic Reveal Linq model LNQ 22 (SN [id number]) implantable loop recorder implanted by Dr Rayann Heman for afib management and evaluation of palpitations   TONSILLECTOMY     TOTAL HIP ARTHROPLASTY     right    ROS- all systems are reviewed and negatives except as per HPI above  Current Outpatient Medications  Medication Sig Dispense Refill   aspirin 325 MG tablet aspirin 325 mg tablet  Take 1 tablet every day by oral route.     Azelastine HCl (ASTEPRO) 0.15 % SOLN Place 1 spray into the nose 2 (two) times daily as needed (Sinus congestion, allergies).     clobetasol ointment (TEMOVATE) 0.05 % APPLY TO AFFECTED AREA AFTER BATHING. (GENERIC PLEASE) 60 g 1   diltiazem (CARDIZEM CD) 120  MG 24 hr capsule TAKE 1 CAPSULE(120 MG) BY MOUTH DAILY 90 capsule 3   fluticasone (FLONASE) 50 MCG/ACT nasal spray Place 1 spray into both nostrils daily as needed for allergies or rhinitis. 16 g 2   Multiple Vitamin (MULTIVITAMIN PO) Take by mouth.     No current facility-administered medications for this visit.    Physical Exam: Vitals:   02/12/22 1500  BP: 130/78  Pulse: 71  Weight: 202 lb (91.6 kg)  Height: 6' (1.829 m)    GEN- The patient is well appearing, alert and oriented x 3 today.   Head- normocephalic, atraumatic Eyes-  Sclera clear, conjunctiva pink Ears- hearing intact Oropharynx- clear Lungs- Clear to ausculation bilaterally, normal work of breathing Heart- Regular rate and rhythm, no murmurs, rubs or gallops, PMI not laterally displaced GI- soft, NT, ND, + BS Extremities- no clubbing, cyanosis, or edema  Wt Readings from Last 3 Encounters:  02/12/22 202 lb (91.6 kg)  01/30/22 199 lb 6.4 oz (90.4 kg)  11/28/21 210 lb 9.6 oz (95.5 kg)    EKG tracing ordered today is personally reviewed and shows sinus rhythm 71 bpm, PR 108 msec, RBBB, LAD  Echo 01/01/21- reviewed today  Assessment and Plan:  Paroxysmal atrial fibrillation Well controlled post ablation No AF by ILR (reviewed today) chads2vasc score is 1.  He does not wish to take [facility] but continues  to take ASA '325mg'$  daily for arthritis  2. Tobacco Cessation advised  3. ILR His ILR is at RRT.  He wishes to have this removed. Risks, benefits and alternatives to ILR removal were discussed with patient and his wife today.  Risks including bleeding and infection were discussed.  He is aware and wishes to have his device removed.  We will schedule for the next available time.  Thompson Grayer MD, [facility] 02/12/2022 3:32 PM

## 2022-02-16 ENCOUNTER — Other Ambulatory Visit: Payer: Self-pay | Admitting: Nurse Practitioner

## 2022-02-16 DIAGNOSIS — A498 Other bacterial infections of unspecified site: Secondary | ICD-10-CM

## 2022-02-16 MED ORDER — LEVOFLOXACIN 500 MG PO TABS: 500.0000 mg | ORAL_TABLET | Freq: Every day | ORAL | 0 refills | Status: AC

## 2022-02-16 NOTE — Progress Notes (Signed)
Reviewed results with Dr. Halford Chessman - plan to treat for pseudomonas. Fungal cultures thought to not be of significance. Discussed with patient - we will treat him with levaquin 500 mg x 7 days. Advised to take with food and take a daily probiotic while taking. Notify immediately of any tendon pain or >3-4 watery BMs in a day. Follow up scheduled for 8/18.

## 2022-02-25 NOTE — Telephone Encounter (Signed)
Closing encounter

## 2022-02-26 ENCOUNTER — Other Ambulatory Visit: Payer: Self-pay | Admitting: Internal Medicine

## 2022-02-27 ENCOUNTER — Ambulatory Visit: Payer: Medicare Other | Admitting: Nurse Practitioner

## 2022-02-27 ENCOUNTER — Ambulatory Visit: Payer: Medicare Other | Admitting: Internal Medicine

## 2022-02-27 ENCOUNTER — Encounter: Payer: Self-pay | Admitting: Nurse Practitioner

## 2022-02-27 ENCOUNTER — Encounter: Payer: Self-pay | Admitting: Internal Medicine

## 2022-02-27 VITALS — BP 124/78 | HR 93 | Ht 72.0 in | Wt 202.0 lb

## 2022-02-27 DIAGNOSIS — J302 Other seasonal allergic rhinitis: Secondary | ICD-10-CM | POA: Diagnosis not present

## 2022-02-27 DIAGNOSIS — J432 Centrilobular emphysema: Secondary | ICD-10-CM

## 2022-02-27 DIAGNOSIS — I480 Paroxysmal atrial fibrillation: Secondary | ICD-10-CM | POA: Diagnosis not present

## 2022-02-27 DIAGNOSIS — J849 Interstitial pulmonary disease, unspecified: Secondary | ICD-10-CM

## 2022-02-27 DIAGNOSIS — L409 Psoriasis, unspecified: Secondary | ICD-10-CM | POA: Diagnosis not present

## 2022-02-27 DIAGNOSIS — Z720 Tobacco use: Secondary | ICD-10-CM

## 2022-02-27 NOTE — Assessment & Plan Note (Signed)
Mild emphysema on CT imaging.  Previous PFTs without formal diagnosis of obstruction.  No significant response to Stiolto.  He is not currently on any maintenance therapy and does not use albuterol.

## 2022-02-27 NOTE — Assessment & Plan Note (Signed)
Again encouraged to quit. No desire to quit. Smokes anywhere between half pack to three quarters of a pack a day.

## 2022-02-27 NOTE — Progress Notes (Signed)
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 male who presents today for routine electrophysiology followup.  Since last being seen in our clinic, the patient reports doing very well.  He is here today for ILR removal.   Past Medical History:  Diagnosis Date   Adenomatous colon polyp    TA and SS   COPD (chronic obstructive pulmonary disease) (HCC)    Erectile dysfunction    Numbness and tingling    right arm    Paroxysmal A-fib (HCC)    no recurrence s/p atrial flutter ablation (previously atrial flutter felt to degenerate into afib)   Psoriasis    TIA (transient ischemic attack) 2004   Past Surgical History:  Procedure Laterality Date   A FLUTTER ABLATION     March 2011 at [facility] by Dr Lyanne Co Dixat   ATRIAL FIBRILLATION ABLATION N/A 11/07/2019   Procedure: [place];  Surgeon: Thompson Grayer, MD;  Location: [place] [facility];  Service: Cardiovascular;  Laterality: N/A;   implantable loop recorder insertion  09/11/2019   Medtronic Reveal Linq model LNQ 22 (SN [id number]) implantable loop recorder implanted by Dr Rayann Heman for afib management and evaluation of palpitations   TONSILLECTOMY     TOTAL HIP ARTHROPLASTY     right    ROS- all systems are reviewed and negatives except as per HPI above  Current Outpatient Medications  Medication Sig Dispense Refill   aspirin 325 MG tablet aspirin 325 mg tablet  Take 1 tablet every day by oral route.     Azelastine HCl (ASTEPRO) 0.15 % SOLN Place 1 spray into the nose 2 (two) times daily as needed (Sinus congestion, allergies).     cetirizine (ZYRTEC) 10 MG tablet Take 10 mg by mouth daily.     clobetasol ointment (TEMOVATE) 0.05 % APPLY TO AFFECTED AREA AFTER BATHING. (GENERIC PLEASE) 60 g 1   diltiazem (CARDIZEM CD) 120 MG 24 hr capsule TAKE 1 CAPSULE(120 MG) BY MOUTH DAILY 90 capsule 3   fluticasone (FLONASE) 50 MCG/ACT nasal spray Place 1 spray into both nostrils  daily as needed for allergies or rhinitis. 16 g 2   Multiple Vitamin (MULTIVITAMIN PO) Take by mouth.     No current facility-administered medications for this visit.    Physical Exam: Vitals:   02/27/22 0812  BP: 124/78  Pulse: 93  SpO2: 96%  Weight: 202 lb (91.6 kg)  Height: 6' (1.829 m)    GEN- The patient is well appearing, alert and oriented x 3 today.   Head- normocephalic, atraumatic Eyes-  Sclera clear, conjunctiva pink Ears- hearing intact Oropharynx- clear Lungs-  normal work of breathing Heart- Regular rate and rhythm  GI- soft  Extremities- no clubbing, cyanosis, or edema  Wt Readings from Last 3 Encounters:  02/27/22 202 lb (91.6 kg)  02/12/22 202 lb (91.6 kg)  01/30/22 199 lb 6.4 oz (90.4 kg)     Assessment and Plan:  Afib Well controlled post ablation He wishes to have his ILR removed. chads2vasc score is 1.  He does not wish to take [facility] but continues to take ASA '325mg'$  daily for arthritis  Risks and benefits to ILR were discussed at length with the patient today, including but not limited to risks of bleeding and infection.   The patient accepts risks and wishes to proceed.   Thompson Grayer MD, [facility] 02/27/2022 8:13 AM   PROCEDURES:   1. Implantable  loop recorder explantation     DESCRIPTION OF PROCEDURE:  Informed written consent was obtained.  The patient required no sedation for the procedure today.   The patients left chest was therefore prepped and draped in the usual sterile fashion.  The skin overlying the ILR monitor was infiltrated with lidocaine for local analgesia.  A 0.5-cm incision was made over the site.  The previously implanted ILR was exposed and removed using a combination of sharp and blunt dissection.  Steri- Strips and a sterile dressing were then applied. EBL<80m.  There were no early apparent complications.     CONCLUSIONS:   1. Successful explantation of a Medtronic Reveal LINQ implantable loop recorder   2. No early apparent  complications.        JThompson GrayerMD, [facility]8/18/2023 8:16 AM

## 2022-02-27 NOTE — Assessment & Plan Note (Addendum)
Respiratory status is stable and he has no significant symptom burden.  Interval worsening on CT from July which prompted further evaluation.  Could be possible autoimmune inflammatory or chronic infectious process.  Unable to entirely rule out malignancy; however constellation of findings is not consistent with this. Serologies were negative. He has a hx of waxing and waning sedimentation rates; this was actually the lowest it has been at 21. Sputum culture grew out pseudomonas so he was treated with 7 day course of levaquin. He has had improvement in his cough and chest congestion since. We discussed possible next steps. He would like to refrain from bronchoscopy at this point. Shared decision to move forward with watchful waiting. We will repeat CT in 12 weeks to assess for improvement, unless he develops worsening symptoms in the interim.   Patient Instructions  Continue flonase nasal spray 1-2 sprays each nostril daily for nasal congestion/allergies Continue Azelastine 1 spray Twice daily as needed for sinus congestion or allergies  Continue over the counter allergy medicine   Repeat CT chest in 3 months    Follow up in 3 months after CT scan with Dr. Halford Chessman. If symptoms do not improve or worsen, please contact office for sooner follow up or seek emergency care.

## 2022-02-27 NOTE — Patient Instructions (Addendum)
Medication Instructions:  Your physician recommends that you continue on your current medications as directed. Please refer to the Current Medication list given to you today.   Labwork: None ordered.   Testing/Procedures: Removal of Loop Recorder   Follow-Up: You will follow up with the Atrial Fib Clinic at [facility] in 1 year.  Your physician wants you to follow-up in: one year with ________A-FIB CLINIC___________.  You will receive a reminder letter in the mail two months in advance. If you don't receive a letter, please call our office to schedule the follow-up appointment.    Implantable Loop Recorder Removal, Care After This sheet gives you information about how to care for yourself after your procedure. Your health care provider may also give you more specific instructions. If you have problems or questions, contact your health care provider. What can I expect after the procedure? After the procedure, it is common to have: Soreness or discomfort near the incision. Some swelling or bruising near the incision.  Follow these instructions at home: Incision care  Monitor your cardiac device site for redness, swelling, and drainage. Call the device clinic at [phone] if you experience these symptoms or fever/chills.  Keep the large square bandage on your site for 24 hours and then you may remove it yourself. Keep the steri-strips underneath in place.   You may shower after 72 hours / 3 days from your procedure with the steri-strips in place. They will usually fall off on their own, or may be removed after 10 days. Pat dry.   Avoid lotions, ointments, or perfumes over your incision until it is well-healed.  Please do not submerge in water until your site is completely healed.   If your wound site starts to bleed apply pressure.       If you have any questions/concerns please call the device clinic at [phone].  Activity  Return to your normal  activities.  Contact a health care provider if: You have redness, swelling, or pain around your incision. You have a fever.

## 2022-02-27 NOTE — Assessment & Plan Note (Signed)
Stable and unchanged from his baseline.  Continue azelastine and Flonase for postnasal drainage control and over-the-counter allergy medicine as needed for allergies.

## 2022-02-27 NOTE — Patient Instructions (Addendum)
Continue flonase nasal spray 1-2 sprays each nostril daily for nasal congestion/allergies Continue Azelastine 1 spray Twice daily as needed for sinus congestion or allergies  Continue over the counter allergy medicine   Repeat CT chest in 3 months    Follow up in 3 months after CT scan with Dr. Halford Chessman. If symptoms do not improve or worsen, please contact office for sooner follow up or seek emergency care.

## 2022-02-27 NOTE — Progress Notes (Signed)
_0  ID: Lawrence Rivas, male    DOB: 11-Jun-1951, 71 y.o.   MRN: [id number]  Chief Complaint  Patient presents with   Follow-up    results    Referring provider: Colon Branch, MD  HPI: 71 year old male, current every day smoker (25 pack years) followed for emphysema and post viral pneumonitis. He is a patient of Dr. Juanetta Gosling and last seen in office on 01/30/2022 by [facility] NP. Past medical history significant for a fib, atherosclerosis, allergic rhinitis, psoriasis (never confirmed by bx), tobacco abuse, ED, osteoarthritis.  He is followed by rheumatology and cardiology.  His recent work-up by rheumatology was unremarkable; suspected his joint pains were related to severe osteoarthritis.  TEST/EVENTS:  02/03/2021 CT chest without contrast: Atherosclerosis.  High right paratracheal lymph node measures 10 mm.  Mediastinal lymph nodes are subcentimeter in short axis.  Subcarinal lymph node measures 12 mm.  Centrilobular emphysema.  Patchy areas of parenchymal groundglass consolidation, bronchiectasis and architectural distortion bilaterally 03/19/2021 PFTs: FVC 4.2 (88), FEV1 3.09 (88), ratio 73, TLC 90%, DLCOcor 93%.  No BD. Overall normal spirometry and diffusion capacity 08/18/2021 HRCT chest: Mildly enlarged 1.1 cm subcarinal node, stable.  Previously visualized bandlike regions of consolidation and groundglass opacity throughout both lungs, substantially decreased with some residual patchy groundglass opacities and reticulation in these locations, possibly scarring.  Some new thick curvilinear bandlike focus of consolidation in the lingula.  New irregular nodular consolidation in anterior right lower lobe measuring 2.4 x 1.5 cm.  New small subpleural solid 0.4 cm pulmonary nodule in posterior right lower lobe.  Some areas of mild groundglass opacities appear new in right upper lobe and posterior peripheral left upper lobe when compared to previous.  Mild cylindrical bronchiectasis in both lungs have  improved.  Atherosclerosis present. 09/30/2021 PET scan: New irregular nodular opacity in the right middle lobe demonstrates low-level hypermetabolism.  Likely infectious/inflammatory.  The area of concern in the right lower lobe on previous scans shows interval evolution and is less confluent today with smaller nodular component on CT imaging.  No associated hypermetabolism.  No other areas of hypermetabolism noted. 01/23/2022 CT chest without contrast: Atherosclerosis.  Unchanged prominent mediastinal lymph nodes.  No overt LAD.  Mild centrilobular emphysema.  Diffuse bilateral bronchial wall thickening.  Multiple waxing and waning bilateral heterogeneous groundglass and consolidative airspace opacities are again seen throughout the lungs, several previous lesions have improved however with multiple new lesions bilaterally and overall worsening of findings.  There is a new nodular index opacity of the right lower lobe which appears to demonstrate subtle internal clearing or cavitation and measures 3.1 x 2 cm.  More amorphous heterogeneous opacity of the anterior right upper lobe measures 3.9 x 2.3 cm.  Asymmetric thickening hypodensity and fat stranding involving the right subscapularis musculature, nonspecific and of uncertain significance. 02/02/2022: serologies negative; ESR 21; sputum culture positive for pseudomonas  09/01/2021: OV with Doree Kuehne NP for follow-up after high-resolution CT scan.  He reports that his breathing has been stable and he has noticed an improvement in his activity tolerance.  He is able to complete his normal 6 miles a day versus 1 mile a day.  He has noticed an improvement in his stamina as well and no longer requiring a nap midday.  He does have some allergy type symptoms with postnasal drip and associated cough, occurring only in AM with clear sputum. Describes it as a "clearing cough". He states this is normal for this time of year  when things begin to blood. He denies infectious  symptoms such as fevers, chills, purulent sputum or persistent productive cough. He denies orthopnea, PND, chest pain or lower extremity swelling. No anorexia, unintentional weight loss, or hemoptysis. He continues on Stilto daily, but is unsure if it has made a huge impact on his breathing or not. He does feel better than he did at his last visit, from a breathing standpoint.  Discussed trial off of Stiolto versus continuing -patient opted to continue using.  Continues to smoke daily with no desire to quit -smoking cessation advised.  Advised that he start Zyrtec or Claritin over-the-counter for allergies and restart Flonase nasal spray.  He was evaluated by rheumatology in November due to abnormal CT and joint pain. Possible psoriatic arthritis given fluctuating ESR and hx vs osteoarthritis. No psoriasis plaques or other systemic symptoms noted.  Most recent HRCT showed a new 2.4 x 1.5 cm irregular nodule and solid 4 mm nodule.  Inflammatory versus malignancy -PET scan ordered for further evaluation.  11/28/2021: OV with Dr. Halford Chessman.  CT chest in February showed a new nodule.  PET scan in April showed this had decreased in size but then had a new right lower lobe nodule.  Plan to repeat CT in July 2023.  Breathing overall stable.  HRCT showed some mild residual scarring and bronchiectasis.  Advised to monitor clinically.  Trialed off Stiolto.  01/30/2022: OV with Shakerra Red NP for follow-up after repeat CT scan.  CT overall looks worse with increased areas of groundglass and consolidative airspace opacities throughout the lungs, which has been waxing and waning for some time now.  He has diffuse bronchial wall thickening.  There is a new nodular index opacity in the right lower lobe measuring 3.1 x 2 cm and a anterior right upper lobe measuring 3.9 x 2.3 cm.  Today, we discussed his CT scan results.  He overall feels well.  Breathing is stable.  Has not noticed any difference off Stiolto.  He walks about 6 miles a day  and exercises frequently.  He does not feel like his CT scan reflects his actual lung function.  His cough is unchanged from his baseline.  It is usually productive in the morning and white in color.  He does continue to have postnasal drip, which is unchanged and he feels like contributes to his cough.  He denies any fevers, night sweats, hemoptysis, unintentional weight loss, anorexia.  He has chronic joint pain which is no worse and does not limit his activity.  Respiratory status is stable and he has no significant symptom burden.  Interval worsening on recent CT; patient denies any changes in his symptoms.  Could be possible autoimmune inflammatory or chronic infectious process.  Unable to entirely rule out malignancy; however constellation of findings is not consistent with this.  Previously discussed results with Dr. Halford Chessman.  He will likely need bronchoscopy for further evaluation, which we discussed today.  He is open to this but not entirely sure that he wants to move forward with it at this point.  He would like to wait on his labs to come back first.  Check CBC with differential, ESR, ANA, RA factor and ANCA today.  We will also obtain sputum culture, fungal culture and AFB.  Advised that it can take up to 6 weeks to get final fungal cultures back.  If he decides to move forward with bronchoscopy, we will set him up with Dr. Valeta Harms or Dr. Lamonte Sakai.  02/27/2022: Today -  follow up Patient presents today for follow up. We collected sputum cultures at our previous OV which grew out pseudomonas. Treated him with 7 day course of levaquin. His autoimmune workup was unrevealing. Still awaiting AFB. Fungal cultures thought to not be significant. Today, he reports breathing is overall stable. He does feel like the antibiotic course cleared up his cough. He still has an occasional cough but it is now minimally productive. Doesn't have as much chest congestion either. He denies any fevers, chills, night sweats,  hemoptysis, fatigue, wheezing. He lives an active lifestyle; exercises daily. He had his loop recorder removed today so has to refrain from getting the bandage wet for the next 72 hours. The battery was dead and he hasn't had a cardiac event in over three years so they decided to move forward with removal. He is not currently on any inhalers. He takes flonase, astelin, and OTC antihistamine for allergies. He reviewed information on bronchoscopy and would like to hold off on this for now.   Allergies  Allergen Reactions   Watermelon [Citrullus Vulgaris] Itching    Any melon   Coconut (Cocos Nucifera) Hives    Immunization History  Administered Date(s) Administered   Fluad Quad(high Dose 65+) 04/01/2020, 04/28/2021   Influenza Split 05/01/2014   Influenza,inj,Quad PF,6+ Mos 06/05/2013   Influenza,inj,quad, With Preservative 04/13/2019   Influenza-Unspecified 05/17/2016   PFIZER(Purple Top)SARS-COV-2 Vaccination 07/29/2019, 08/19/2019, 10/22/2020   Pfizer Covid-19 Vaccine Bivalent Booster 60yr & up 05/26/2021   Pneumococcal Conjugate-13 04/01/2020   Pneumococcal Polysaccharide-23 06/18/2011   Tdap 06/18/2011   Zoster, Live 06/05/2013    Past Medical History:  Diagnosis Date   Adenomatous colon polyp    TA and SS   COPD (chronic obstructive pulmonary disease) (HCC)    Erectile dysfunction    Numbness and tingling    right arm    Paroxysmal A-fib (HCC)    no recurrence s/p atrial flutter ablation (previously atrial flutter felt to degenerate into afib)   Psoriasis    TIA (transient ischemic attack) 2004    Tobacco History: Social History   Tobacco Use  Smoking Status Every Day   Packs/day: 0.50   Years: 50.00   Total pack years: 25.00   Types: Cigarettes   Start date: 1972  Smokeless Tobacco Never  Tobacco Comments   < 1/2 pack a day as of 05-2021    Ready to quit: Not Answered Counseling given: Not Answered Tobacco comments: < 1/2 pack a day as of 05-2021     Outpatient Medications Prior to Visit  Medication Sig Dispense Refill   aspirin 325 MG tablet aspirin 325 mg tablet  Take 1 tablet every day by oral route.     Azelastine HCl (ASTEPRO) 0.15 % SOLN Place 1 spray into the nose 2 (two) times daily as needed (Sinus congestion, allergies).     cetirizine (ZYRTEC) 10 MG tablet Take 10 mg by mouth daily.     clobetasol ointment (TEMOVATE) 0.05 % APPLY TO AFFECTED AREA AFTER BATHING. (GENERIC PLEASE) 60 g 1   diltiazem (CARDIZEM CD) 120 MG 24 hr capsule TAKE 1 CAPSULE(120 MG) BY MOUTH DAILY 90 capsule 3   fluticasone (FLONASE) 50 MCG/ACT nasal spray Place 1 spray into both nostrils daily as needed for allergies or rhinitis. 16 g 2   Multiple Vitamin (MULTIVITAMIN PO) Take by mouth.     No facility-administered medications prior to visit.     Review of Systems:   Constitutional: No weight loss or gain, night  sweats, fevers, chills, fatigue, or lassitude. HEENT: No headaches, difficulty swallowing, tooth/dental problems, or sore throat. No sneezing, itching, ear ache, nasal congestion, or post nasal drip CV:  No chest pain, orthopnea, PND, swelling in lower extremities, anasarca, dizziness, palpitations, syncope Resp: +occasional minimally productive cough (improved). No significant shortness of breath with exertion. No excess mucus or change in color of mucus. No hemoptysis. No wheezing.  No chest wall deformity GI:  No heartburn, indigestion, abdominal pain, nausea, vomiting, diarrhea, change in bowel habits, loss of appetite, bloody stools.  GU: No dysuria, change in color of urine, urgency or frequency.  No flank pain, no hematuria  Skin: No rash, lesions, ulcerations MSK:  No joint swelling.  No decreased range of motion.  +chronic joint pain and back pain (unchanged) Neuro: No dizziness or lightheadedness.  Psych: No depression or anxiety. Mood stable.     Physical Exam:  BP (!) 142/64 (BP Location: Right Arm, Cuff Size: Normal)    Pulse 80   Temp 98.5 F (36.9 C) (Oral)   Ht 6' (1.829 m)   Wt 204 lb 3.2 oz (92.6 kg)   SpO2 97%   BMI 27.69 kg/m   GEN: Pleasant, interactive, well-appearing; in no acute distress. HEENT:  Normocephalic and atraumatic.  PERRLA. Sclera white. Nasal turbinates pink, moist and patent bilaterally. No rhinorrhea present. Oropharynx pink and moist, without exudate or edema. No lesions, ulcerations NECK:  Supple w/ fair ROM. No JVD present. Normal carotid impulses w/o bruits. Thyroid symmetrical with no goiter or nodules palpated. No lymphadenopathy.   CV: RRR, no m/r/g, no peripheral edema. Pulses intact, +2 bilaterally. No cyanosis, pallor or clubbing. PULMONARY:  Unlabored, regular breathing.  Diminished bases, otherwise clear bilaterally A&P w/o wheezes/rales/rhonchi. No accessory muscle use. No dullness to percussion. GI: BS present and normoactive. Soft, non-tender to palpation. No organomegaly or masses detected. No CVA tenderness. MSK: No erythema, warmth or tenderness. Cap refil <2 sec all extrem. No deformities or joint swelling noted.  Neuro: A/Ox3. No focal deficits noted.   Skin: Warm, no lesions or rashe Psych: Normal affect and behavior. Judgement and thought content appropriate.     Lab Results:  CBC    Component Value Date/Time   WBC 6.4 01/30/2022 1233   RBC 4.19 (L) 01/30/2022 1233   HGB 14.4 01/30/2022 1233   HGB 13.9 10/31/2019 1351   HCT 42.7 01/30/2022 1233   HCT 40.8 10/31/2019 1351   PLT 246.0 01/30/2022 1233   PLT 206 10/31/2019 1351   MCV 101.8 (H) 01/30/2022 1233   MCV 100 (H) 10/31/2019 1351   MCH 32.7 05/13/2021 1039   MCHC 33.7 01/30/2022 1233   RDW 13.8 01/30/2022 1233   RDW 13.4 10/31/2019 1351   LYMPHSABS 1.9 01/30/2022 1233   LYMPHSABS 2.1 10/31/2019 1351   MONOABS 0.7 01/30/2022 1233   EOSABS 0.1 01/30/2022 1233   EOSABS 0.1 10/31/2019 1351   BASOSABS 0.1 01/30/2022 1233   BASOSABS 0.0 10/31/2019 1351    BMET    Component Value  Date/Time   NA 135 11/18/2021 0950   NA 134 10/31/2019 1351   K 4.8 11/18/2021 0950   CL 99 11/18/2021 0950   CO2 27 11/18/2021 0950   GLUCOSE 95 11/18/2021 0950   BUN 16 11/18/2021 0950   BUN 21 10/31/2019 1351   CREATININE 0.71 11/18/2021 0950   CREATININE 0.66 (L) 05/13/2021 1039   CALCIUM 9.2 11/18/2021 0950   GFRNONAA 87 10/31/2019 1351   GFRAA 101 10/31/2019 1351  BNP No results found for: "BNP"   Imaging:  CUP PACEART REMOTE DEVICE CHECK  Result Date: 02/05/2022 ILR summary report received. Battery status OK. Normal device function. No new symptom, tachy, brady, or pause episodes. No new AF episodes. Monthly summary reports and ROV/PRN LA        Latest Ref Rng & Units 03/19/2021    8:40 AM  PFT Results  FVC-Pre L 4.18   FVC-Predicted Pre % 87   FVC-Post L 4.20   FVC-Predicted Post % 88   Pre FEV1/FVC % % 74   Post FEV1/FCV % % 73   FEV1-Pre L 3.09   FEV1-Predicted Pre % 88   FEV1-Post L 3.06   DLCO uncorrected ml/min/mmHg 22.47   DLCO UNC% % 81   DLCO corrected ml/min/mmHg 25.85   DLCO COR %Predicted % 93   DLVA Predicted % 104   TLC L 6.71   TLC % Predicted % 90   RV % Predicted % 99     No results found for: "NITRICOXIDE"      Assessment & Plan:   Interstitial pulmonary disease (HCC) Respiratory status is stable and he has no significant symptom burden.  Interval worsening on CT from July which prompted further evaluation.  Could be possible autoimmune inflammatory or chronic infectious process.  Unable to entirely rule out malignancy; however constellation of findings is not consistent with this. Serologies were negative. He has a hx of waxing and waning sedimentation rates; this was actually the lowest it has been at 21. Sputum culture grew out pseudomonas so he was treated with 7 day course of levaquin. He has had improvement in his cough and chest congestion since. We discussed possible next steps. He would like to refrain from bronchoscopy at  this point. Shared decision to move forward with watchful waiting. We will repeat CT in 12 weeks to assess for improvement, unless he develops worsening symptoms in the interim.   Patient Instructions  Continue flonase nasal spray 1-2 sprays each nostril daily for nasal congestion/allergies Continue Azelastine 1 spray Twice daily as needed for sinus congestion or allergies  Continue over the counter allergy medicine   Repeat CT chest in 3 months    Follow up in 3 months after CT scan with Dr. Halford Chessman. If symptoms do not improve or worsen, please contact office for sooner follow up or seek emergency care.     Emphysema lung (HCC) Mild emphysema on CT imaging.  Previous PFTs without formal diagnosis of obstruction.  No significant response to Stiolto.  He is not currently on any maintenance therapy and does not use albuterol.  Allergic rhinitis Stable and unchanged from his baseline.  Continue azelastine and Flonase for postnasal drainage control and over-the-counter allergy medicine as needed for allergies.  Psoriasis Followed by rheumatology. Concern for psoriatic arthritis vs osteoarthritis. No evidence of psoriatic plaques or other systemic findings on most recent exam.   Waxing and waning ESR levels in the past. Most recent check 21.   Tobacco abuse Again encouraged to quit. No desire to quit. Smokes anywhere between half pack to three quarters of a pack a day.       Clayton Bibles, NP 02/27/2022  Pt aware and understands NP's role.

## 2022-02-27 NOTE — Assessment & Plan Note (Signed)
Followed by rheumatology. Concern for psoriatic arthritis vs osteoarthritis. No evidence of psoriatic plaques or other systemic findings on most recent exam.  Waxing and waning ESR levels in the past. Most recent check 21.

## 2022-03-07 NOTE — Progress Notes (Signed)
Reviewed and agree with assessment/plan.   Chesley Mires, MD [facility] Pulmonary/Critical [facility] 03/07/2022, 2:31 PM Pager:  [phone]

## 2022-03-14 NOTE — Progress Notes (Signed)
Carelink Summary Report / Loop Recorder 

## 2022-03-20 LAB — FUNGUS CULTURE W SMEAR
MICRO NUMBER:: 13685315
SMEAR:: NONE SEEN
SPECIMEN QUALITY:: ADEQUATE

## 2022-03-20 LAB — RESPIRATORY CULTURE OR RESPIRATORY AND SPUTUM CULTURE
MICRO NUMBER:: 13685316
RESULT:: NORMAL
SPECIMEN QUALITY:: ADEQUATE

## 2022-03-27 LAB — AFB CULTURE WITH SMEAR (NOT AT ARMC)
Acid Fast Culture: NEGATIVE
Acid Fast Smear: NEGATIVE

## 2022-05-06 DIAGNOSIS — M25811 Other specified joint disorders, right shoulder: Secondary | ICD-10-CM | POA: Diagnosis not present

## 2022-05-21 ENCOUNTER — Encounter: Payer: Self-pay | Admitting: Internal Medicine

## 2022-05-21 ENCOUNTER — Ambulatory Visit (INDEPENDENT_AMBULATORY_CARE_PROVIDER_SITE_OTHER): Payer: Medicare Other | Admitting: Internal Medicine

## 2022-05-21 VITALS — BP 138/76 | HR 86 | Temp 97.8°F | Resp 16 | Ht 72.0 in | Wt 200.2 lb

## 2022-05-21 DIAGNOSIS — R399 Unspecified symptoms and signs involving the genitourinary system: Secondary | ICD-10-CM

## 2022-05-21 DIAGNOSIS — J449 Chronic obstructive pulmonary disease, unspecified: Secondary | ICD-10-CM | POA: Diagnosis not present

## 2022-05-21 DIAGNOSIS — I480 Paroxysmal atrial fibrillation: Secondary | ICD-10-CM | POA: Diagnosis not present

## 2022-05-21 DIAGNOSIS — Z0001 Encounter for general adult medical examination with abnormal findings: Secondary | ICD-10-CM | POA: Diagnosis not present

## 2022-05-21 DIAGNOSIS — Z0000 Encounter for general adult medical examination without abnormal findings: Secondary | ICD-10-CM

## 2022-05-21 LAB — COMPREHENSIVE METABOLIC PANEL
ALT: 23 U/L (ref 0–53)
AST: 22 U/L (ref 0–37)
Albumin: 3.8 g/dL (ref 3.5–5.2)
Alkaline Phosphatase: 64 U/L (ref 39–117)
BUN: 21 mg/dL (ref 6–23)
CO2: 27 mEq/L (ref 19–32)
Calcium: 8.9 mg/dL (ref 8.4–10.5)
Chloride: 103 mEq/L (ref 96–112)
Creatinine, Ser: 0.69 mg/dL (ref 0.40–1.50)
GFR: 92.75 mL/min (ref >60.00)
Glucose, Bld: 90 mg/dL (ref 70–99)
Potassium: 4.8 mEq/L (ref 3.5–5.1)
Sodium: 136 mEq/L (ref 135–145)
Total Bilirubin: 0.5 mg/dL (ref 0.2–1.2)
Total Protein: 6.6 g/dL (ref 6.0–8.3)

## 2022-05-21 LAB — URINALYSIS, ROUTINE W REFLEX MICROSCOPIC
Bilirubin Urine: NEGATIVE
Hgb urine dipstick: NEGATIVE
Ketones, ur: NEGATIVE
Leukocytes,Ua: NEGATIVE
Nitrite: NEGATIVE
RBC / HPF: NONE SEEN (ref 0–?)
Specific Gravity, Urine: 1.025 (ref 1.000–1.030)
Total Protein, Urine: NEGATIVE
Urine Glucose: NEGATIVE
Urobilinogen, UA: 0.2 (ref 0.0–1.0)
pH: 6 (ref 5.0–8.0)

## 2022-05-21 LAB — LIPID PANEL
Cholesterol: 149 mg/dL (ref 0–200)
HDL: 87.5 mg/dL (ref >39.00)
LDL Cholesterol: 54 mg/dL (ref 0–99)
NonHDL: 61.87
Total CHOL/HDL Ratio: 2
Triglycerides: 40 mg/dL (ref 0.0–149.0)
VLDL: 8 mg/dL (ref 0.0–40.0)

## 2022-05-21 LAB — PSA: PSA: 0.29 ng/mL (ref 0.10–4.00)

## 2022-05-21 LAB — TSH: TSH: 1.16 u[IU]/mL (ref 0.35–5.50)

## 2022-05-21 IMAGING — CT CT CHEST HIGH RESOLUTION
2 of 7 series · 14 of 36 positions shown, 17 images · non-contrast
Comparison: 02/03/2021 chest CT.

CLINICAL DATA: Follow-up lung opacities. History of viral pneumonia
in [place] [address].



[Series 4: high resolution · axial · 0.72mm/px · z∈[-350,-70]mm · 11 of 338 slices shown, 14 images]
[im 29/338  mediastinal]
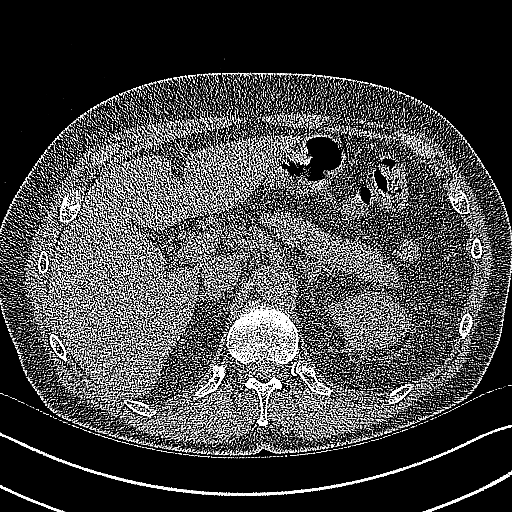
[im 29/338  lung]
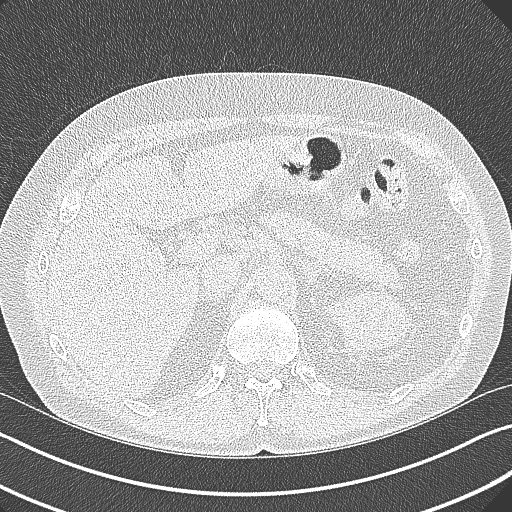
[im 57/338  lung]
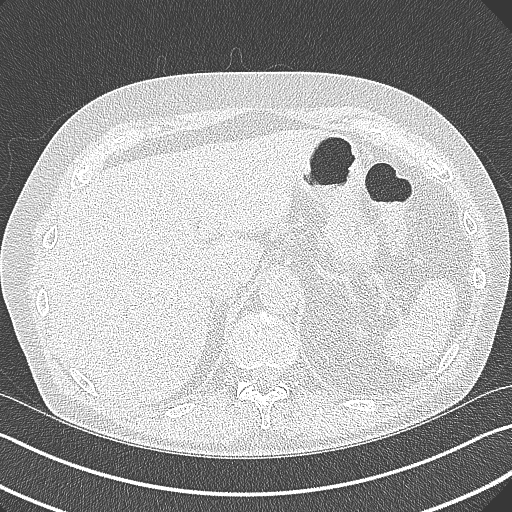
[im 85/338  lung]
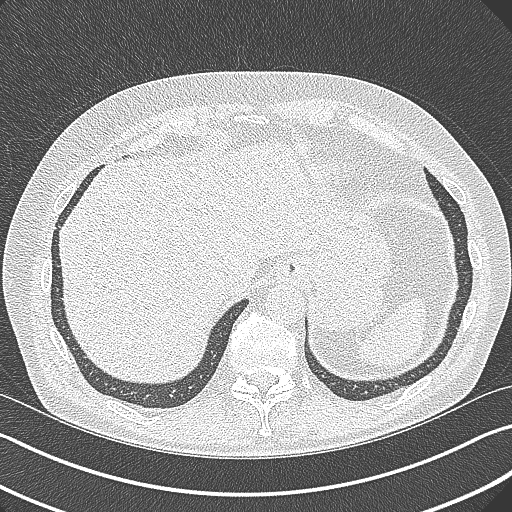
[im 113/338  lung]
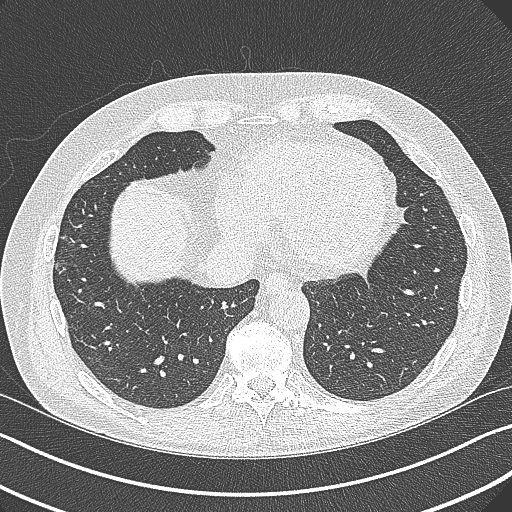
[im 141/338  mediastinal]
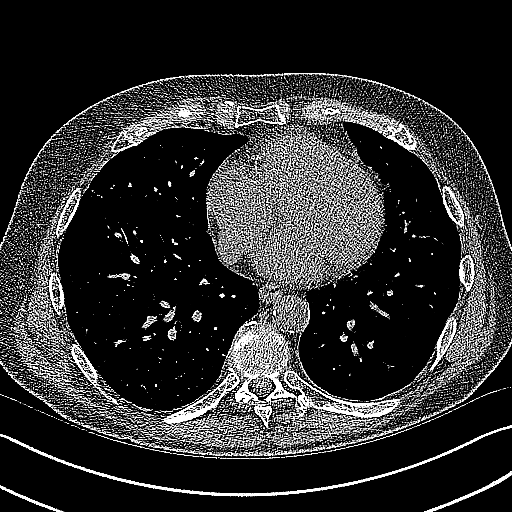
[im 141/338  lung]
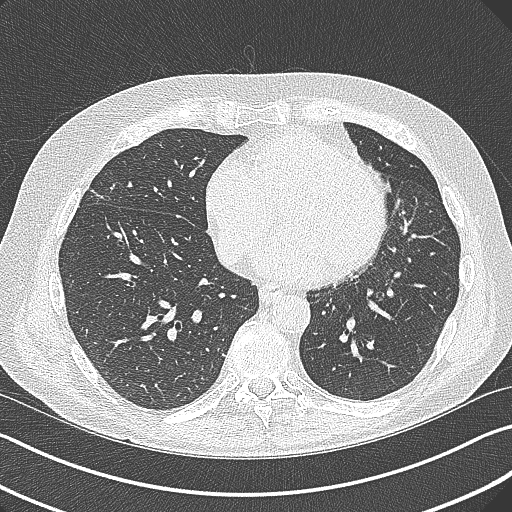
[im 169/338  lung]
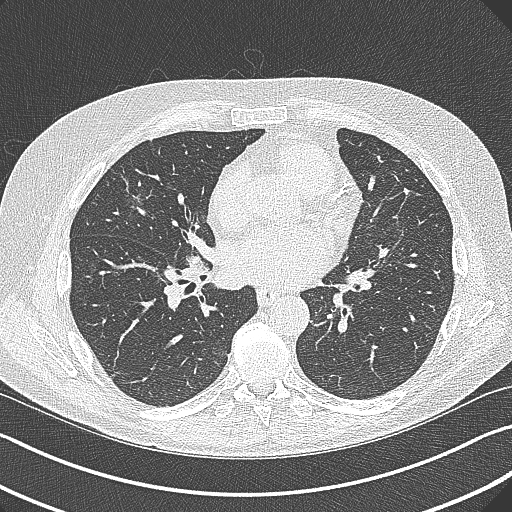
[im 197/338  lung]
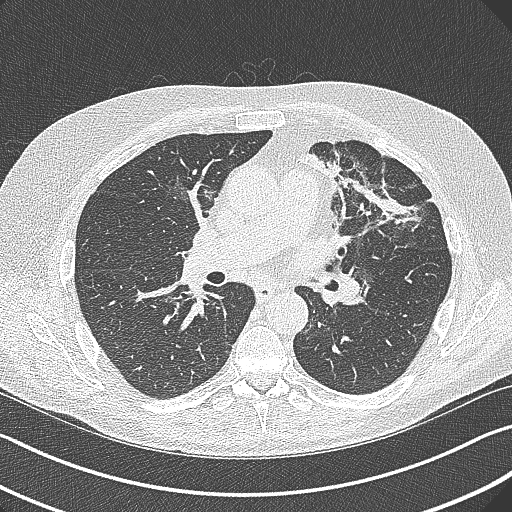
[im 225/338  lung]
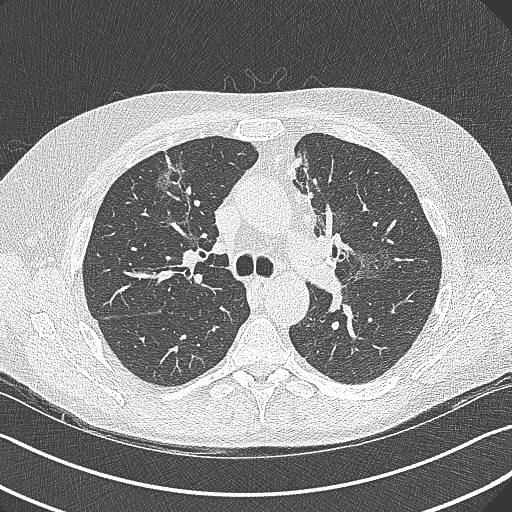
[im 253/338  mediastinal]
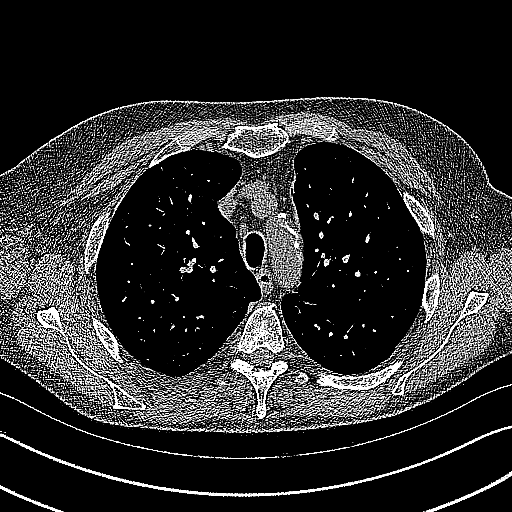
[im 253/338  lung]
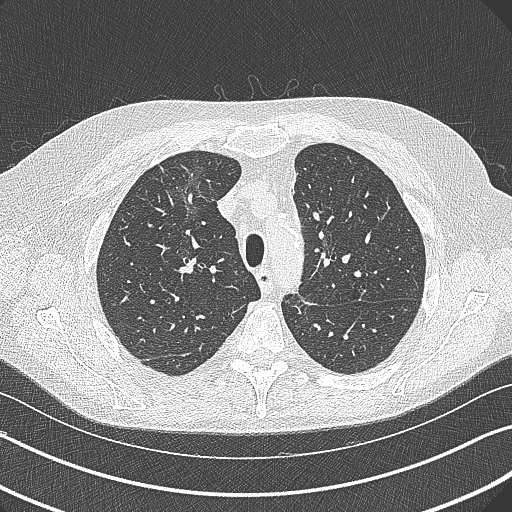
[im 281/338  lung]
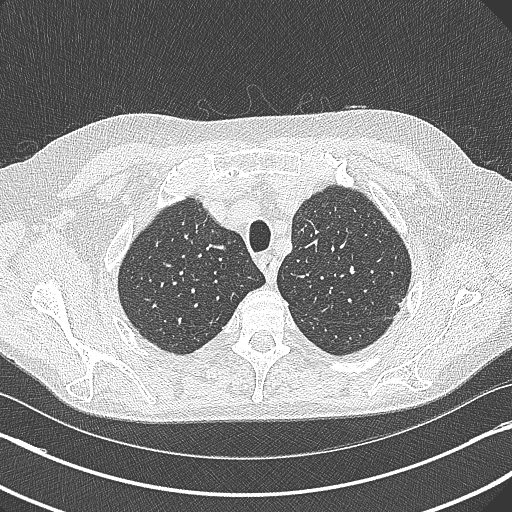
[im 309/338  lung]
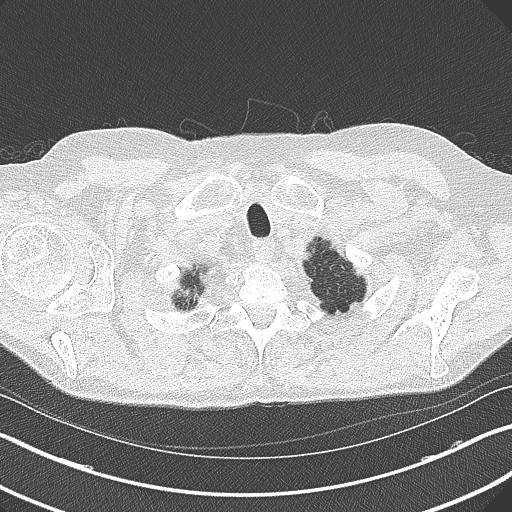

[Series 8: coronal · coronal · 0.67mm/px · 3 of 127 slices shown]
[im 26/127  lung]
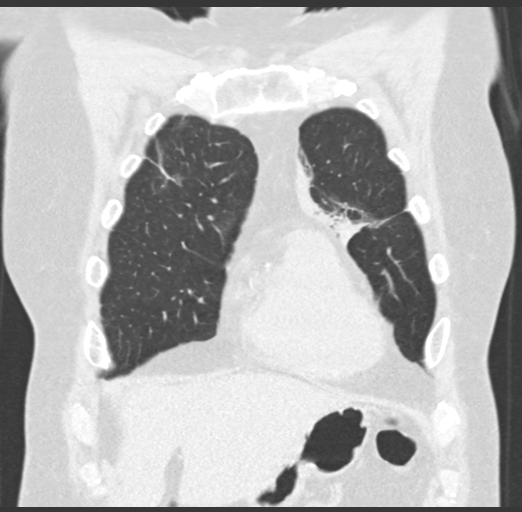
[im 51/127  lung]
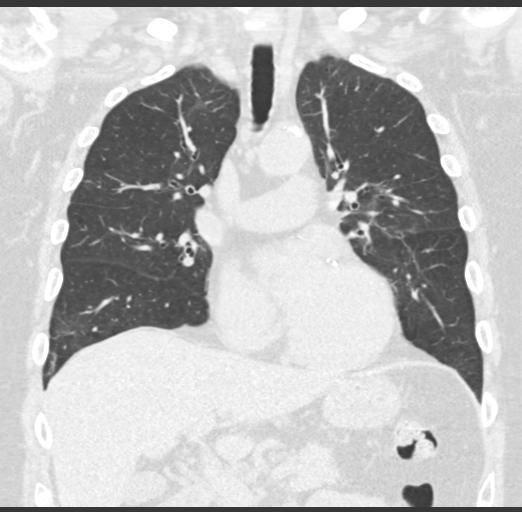
[im 76/127  lung]
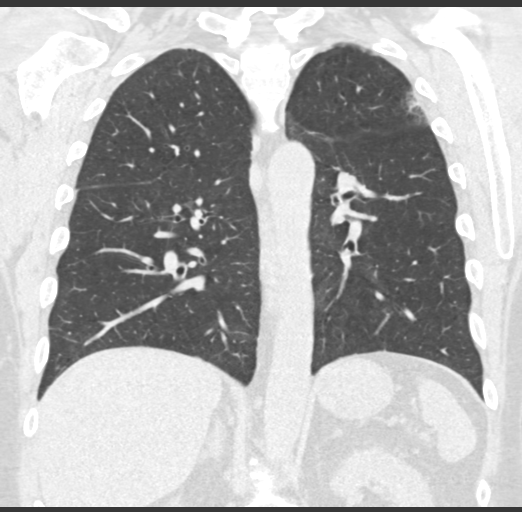

[14 of 36 positions shown; findings below may reference images not displayed]

FINDINGS: Cardiovascular: Normal heart size. No significant pericardial
effusion/thickening. Three-vessel coronary atherosclerosis.
Atherosclerotic nonaneurysmal thoracic aorta. Normal caliber
pulmonary arteries.

Mediastinum/Nodes: No discrete thyroid nodules. Unremarkable
esophagus. No pathologically enlarged axillary lymph nodes. Mildly
enlarged 1.1 cm subcarinal node (series 2/image 60), stable. No
additional pathologically enlarged mediastinal nodes. No discrete
hilar adenopathy on these noncontrast images.

Lungs/Pleura: No pneumothorax. No pleural effusion. Previously
visualized extensive bandlike regions of consolidation and
ground-glass opacity throughout both lungs on 02/03/2021 chest CT
have substantially decreased with residual mild patchy ground-glass
opacities and reticulation in these locations. New thick curvilinear
bandlike focus of consolidation in the lingula (series 3/image 74).
New irregular nodular focus of consolidation in the anterior basilar
right lower lobe measuring 2.4 x 1.5 cm (series 3/image 109). New
small subpleural solid 0.4 cm pulmonary nodule in the posterior
basilar right lower lobe (series 3/image 122). Some of the mild
patchy regions of ground-glass opacity on today's scan appear new in
location since 02/03/2021 chest CT, for example in the right upper
lobe (series 3/image 53) and in posterior peripheral left upper lobe
(series 3/image 35). Scattered regions of mild cylindrical
bronchiectasis in both lungs, overall improved from prior CT. No
frank honeycombing. No clear apicobasilar gradient to these
findings. No significant lobular air trapping or evidence of
tracheobronchomalacia on the expiration sequence.

Upper abdomen: No acute abnormality.

Musculoskeletal: No aggressive appearing focal osseous lesions.
Subcutaneous loop recorder in the ventral upper left chest wall.
Mild thoracic spondylosis.
IMPRESSION: 1. Waxing and waning patchy regions of consolidation and
ground-glass opacity throughout both lungs as detailed. The leading
differential considerations include cryptogenic organizing pneumonia
versus recurrent multilobar infection. Suggest continued
high-resolution chest CT surveillance.
2. One of the new foci of consolidation on today's scan in the right
lower lobe demonstrates an aggressive nodular morphology and is
indeterminate for infectious/inflammatory versus malignant etiology
in this high risk patient. Suggest follow-up chest CT in 3 months.
Alternatively, bronchoscopic evaluation or PET-CT could be
considered at this time, as clinically warranted.
3. Stable mild mediastinal lymphadenopathy, most suggestive of
reactive adenopathy.
4. Three-vessel coronary atherosclerosis.
5. Aortic Atherosclerosis ([id number]).

These results will be called to the ordering clinician or
representative by the Radiologist Assistant, and communication
documented in the PACS or [REDACTED].

## 2022-05-21 MED ORDER — ZOLPIDEM TARTRATE 5 MG PO TABS: 5.0000 mg | ORAL_TABLET | Freq: Every evening | ORAL | 0 refills | Status: DC | PRN

## 2022-05-21 NOTE — Assessment & Plan Note (Signed)
Td 2012.  Booster recommended Zoster 2014  Shingrix: d/w  pt  PNM : 23 2012; PNM 13: 2021 - RSV done per patient Covid vax utd Had a flu shot   CCS:  Reports a Cscope 2009, elsewhere, "benign polyps", cscope 02/2020, multiple polyps. C-scope 12/2021, 1 polyp, next per GI.  Dr. Loletha Carrow Prostate ca screening: DRE and PSA wnl 2021, has occ urinary frequency,  check PSA, UA,UCX Diet, exercise: Discussed Tobacco: Counseled  Labs reviewed: Check CMP FLP TSH PSA UA urine culture POA discussed.

## 2022-05-21 NOTE — Assessment & Plan Note (Signed)
Here for CPX H/o Paroxysmal A-fib: Saw cardiology 02-2022, they explanted the loop recorder.  On Cardizem.  No symptoms. Interstitial lung disease, COPD Saw pulmonary 02-2022, for interstitial lung disease recommend watchful waiting, repeat CT in few weeks. COPD they noted no significant response to Stiolto, on albuterol as needed. Insomnia: Tips provided, melatonin daily.  Start Ambien if needed.  PDMP okay. RTC 6 months

## 2022-05-21 NOTE — Progress Notes (Signed)
Subjective:    Patient ID: Lawrence Rivas, male    DOB: 09-09-1950, 71 y.o.   MRN: [id number]  DOS:  05/21/2022 Type of visit - description: cpx  Here for CPX. Notes from pulmonary and cardiovascular reviewed. Breathing status is good, he is able to exercise regularly daily without DOE.  Denies chest pain or palpitations Did report some urinary frequency with  small amounts of urine.  No gross hematuria.  Also difficulty sleeping for long time  Review of Systems  Other than above, a 14 point review of systems is negative      Past Medical History:  Diagnosis Date   Adenomatous colon polyp    TA and SS   COPD (chronic obstructive pulmonary disease) (HCC)    Erectile dysfunction    Numbness and tingling    right arm    Paroxysmal A-fib (HCC)    no recurrence s/p atrial flutter ablation (previously atrial flutter felt to degenerate into afib)   Psoriasis    TIA (transient ischemic attack) 2004    Past Surgical History:  Procedure Laterality Date   A FLUTTER ABLATION     March 2011 at [facility] by Dr Lyanne Co Dixat   ATRIAL FIBRILLATION ABLATION N/A 11/07/2019   Procedure: [place];  Surgeon: Thompson Grayer, MD;  Location: [facility];  Service: Cardiovascular;  Laterality: N/A;   implantable loop recorder insertion  09/11/2019   Medtronic Reveal Linq model LNQ 22 (SN [id number]) implantable loop recorder implanted by Dr Rayann Heman for afib management and evaluation of palpitations   Implantable loop recorder removal     MDT LINQ removal   TONSILLECTOMY     TOTAL HIP ARTHROPLASTY     right   Social History   Socioeconomic History   Marital status: Married    Spouse name: Not on file   Number of children: 1   Years of education: Not on file   Highest education level: Not on file  Occupational History   Occupation: Architect    Comment: [employer]    Occupation: retired     Fish farm manager: [employer]  Tobacco Use   Smoking status:  Every Day    Packs/day: 0.50    Years: 50.00    Total pack years: 25.00    Types: Cigarettes    Start date: 54   Smokeless tobacco: Never   Tobacco comments:    < 1/2 pack a day as of 05-2021   Vaping Use   Vaping Use: Never used  Substance and Sexual Activity   Alcohol use: Yes    Comment: 2 drinks of scotch per day   Drug use: No   Sexual activity: Not on file  Other Topics Concern   Not on file  Social History Narrative   Household: pt and wife     Social Determinants of Health   Financial Resource Strain: Low Risk  (09/29/2021)   Overall Financial Resource Strain (CARDIA)    Difficulty of Paying Living Expenses: Not hard at all  Food Insecurity: No Food Insecurity (09/29/2021)   Hunger Vital Sign    Worried About Running Out of Food in the Last Year: Never true    Ran Out of Food in the Last Year: Never true  Transportation Needs: No Transportation Needs (09/29/2021)   PRAPARE - Hydrologist (Medical): No    Lack of Transportation (Non-Medical): No  Physical Activity: Sufficiently Active (09/29/2021)   Exercise  Vital Sign    Days of Exercise per Week: 7 days    Minutes of Exercise per Session: 120 min  Stress: No Stress Concern Present (09/29/2021)   [place]    Feeling of Stress : Only a little  Social Connections: Not on file  Intimate Partner Violence: Not At Risk (09/29/2021)   Humiliation, Afraid, Rape, and Kick questionnaire    Fear of Current or Ex-Partner: No    Emotionally Abused: No    Physically Abused: No    Sexually Abused: No    Current Outpatient Medications  Medication Instructions   aspirin 325 MG tablet aspirin 325 mg tablet  Take 1 tablet every day by oral route.   Azelastine HCl (ASTEPRO) 0.15 % SOLN 1 spray, Nasal, 2 times daily PRN   cetirizine (ZYRTEC) 10 mg, Oral, Daily   clobetasol ointment (TEMOVATE) 0.05 % APPLY TO AFFECTED AREA AFTER  BATHING. (GENERIC PLEASE)   diltiazem (CARDIZEM CD) 120 MG 24 hr capsule TAKE 1 CAPSULE(120 MG) BY MOUTH DAILY   fluticasone (FLONASE) 50 MCG/ACT nasal spray 1 spray, Each Nare, Daily PRN   Multiple Vitamin (MULTIVITAMIN PO) Oral   zolpidem (AMBIEN) 5 mg, Oral, At bedtime PRN       Objective:   Physical Exam BP 138/76   Pulse 86   Temp 97.8 F (36.6 C) (Oral)   Resp 16   Ht 6' (1.829 m)   Wt 200 lb 4 oz (90.8 kg)   SpO2 98%   BMI 27.16 kg/m  General: Well developed, NAD, BMI noted Neck: No  thyromegaly  HEENT:  Normocephalic . Face symmetric, atraumatic Lungs:  CTA B Normal respiratory effort, no intercostal retractions, no accessory muscle use. Heart: RRR,  no murmur.  Abdomen:  Not distended, soft, non-tender. No rebound or rigidity.   Lower extremities: no pretibial edema bilaterally DRE: Normal sphincter tone, no stools, prostate normal Skin: Exposed areas without rash. Not pale. Not jaundice Neurologic:  alert & oriented X3.  Speech normal, gait appropriate for age and unassisted Strength symmetric and appropriate for age.  Psych: Cognition and judgment appear intact.  Cooperative with normal attention span and concentration.  Behavior appropriate. No anxious or depressed appearing.     Assessment    ASSESSMENT P- Atrial fibrillation: Ablation remotely, symptoms resurfaced 2021, loop implantation,  started Xarelto 09-2019, had an ablation April 2021.  Xarelto DC 02-2020.  Loop recorder explanted 02-2022. Tobacco abuse Erectile dysfunction TIA 2004   Pulmonary: Emphysema per CT 01-2021, interstitial lung disease dx ~  08/2021, R lung nodule + FH CAD Father   PLAN Here for CPX H/o Paroxysmal A-fib: Saw cardiology 02-2022, they explanted the loop recorder.  On Cardizem.  No symptoms. Interstitial lung disease, COPD Saw pulmonary 02-2022, for interstitial lung disease recommend watchful waiting, repeat CT in few weeks. COPD they noted no significant response to  Stiolto, on albuterol as needed. Insomnia: Tips provided, melatonin daily.  Start Ambien if needed.  PDMP okay. RTC 6 months  In addition to CPX I addressed his chronic med issues and reviewed the chart

## 2022-05-21 NOTE — Patient Instructions (Addendum)
Vaccines I recommend:  Shingrix (shingles) Tdap  HEALTHY SLEEP Sleep hygiene: Basic rules for a good night's sleep  Sleep only as much as you need to feel rested and then get out of bed  Keep a regular sleep schedule  Avoid forcing sleep  Exercise regularly for at least 20 minutes, preferably 4 to 5 hours before bedtime  Avoid caffeinated beverages after lunch  Avoid alcohol near bedtime: no "night cap"  Avoid smoking, especially in the evening  Do not go to bed hungry  Adjust bedroom environment  Avoid prolonged use of light-emitting screens before bedtime   Deal with your worries before bedtime   Okay to take melatonin between 4 to 8 mg every night. I am printing a prescription for Ambien 5 mg.  You can take it for difficulty sleeping as needed  GO TO THE LAB : Get the blood work     [place], [place] back for a checkup in 6 months        Do you have a "Living will" or "[place] of attorney"? (Advance care planning documents)  If you already have a living will or healthcare power of attorney, is recommended you bring the copy to be scanned in your chart. The document will be available to all the doctors you see in the system.  If you don't have one, please consider create one.    Advance directives can be documented in many types of formats,  documents have names such as:  Lliving will  Durable power of attorney for healthcare (healthcare proxy or healthcare power of attorney)  Combined directives  Physician orders for life-sustaining treatment    More information at:  [email or web address]

## 2022-05-22 LAB — URINE CULTURE
MICRO NUMBER:: 14167366
Result:: NO GROWTH
SPECIMEN QUALITY:: ADEQUATE

## 2022-07-24 ENCOUNTER — Telehealth: Payer: Self-pay | Admitting: Internal Medicine

## 2022-07-24 DIAGNOSIS — I480 Paroxysmal atrial fibrillation: Secondary | ICD-10-CM

## 2022-07-24 NOTE — Telephone Encounter (Signed)
Referral placed to Dr. Raliegh Ip

## 2022-07-24 NOTE — Telephone Encounter (Signed)
Patient called to advise that he found cardiologist in his network as the one he has seen the last several years is now out of his insurance network.. He said that Dr. Geraldo Pitter, Dr. Bettina Gavia, DR. Posey Rea, and Dr. Stanford Breed are all in network so he would like a referral to one of them. Please call him at home number to advise when referral placed and if Dr. Larose Kells has a certain one he recommends.

## 2022-07-28 DIAGNOSIS — K080 Exfoliation of teeth due to systemic causes: Secondary | ICD-10-CM | POA: Diagnosis not present

## 2022-08-03 ENCOUNTER — Telehealth: Payer: Self-pay | Admitting: Physician Assistant

## 2022-08-03 NOTE — Telephone Encounter (Signed)
*  STAT* If patient is at the pharmacy, call can be transferred to refill team.   1. Which medications need to be refilled? (please list name of each medication and dose if known) diltiazem (CARDIZEM CD) 120 MG 24 hr capsule   2. Which pharmacy/location (including street and city if local pharmacy) is medication to be sent to?  y  [place] [place] [address]    3. Do they need a 30 day or 90 day supply? Lawrence Rivas

## 2022-08-04 MED ORDER — DILTIAZEM HCL ER COATED BEADS 120 MG PO CP24: ORAL_CAPSULE | ORAL | 1 refills | Status: DC

## 2022-08-04 NOTE — Telephone Encounter (Signed)
Spoke with patient and informed that I have sent his refill in. Patient thanked me for the call.

## 2022-08-20 ENCOUNTER — Encounter (HOSPITAL_COMMUNITY): Payer: Self-pay | Admitting: *Deleted

## 2022-09-01 ENCOUNTER — Telehealth (HOSPITAL_BASED_OUTPATIENT_CLINIC_OR_DEPARTMENT_OTHER): Payer: Self-pay | Admitting: Pulmonary Disease

## 2022-09-01 DIAGNOSIS — J432 Centrilobular emphysema: Secondary | ICD-10-CM

## 2022-09-01 NOTE — Telephone Encounter (Signed)
Called pt to sch overdue f/u to discuss CT results that were ordered 02/12/21 by TP, but pt states that no one contacted him to sch therefore chest CT not completed. Please advise and call pt back.

## 2022-09-02 NOTE — Telephone Encounter (Signed)
Please schedule next available ROV with me to review his respiratory status and then decide if/when he needs repeat CT chest.

## 2022-09-02 NOTE — Telephone Encounter (Signed)
Pt called office. CT order had already been placed and CT already scheduled which pt wanted to keep. Follow up appt scheduled for pt with Dr. Halford Chessman. Nothing further needed.

## 2022-09-02 NOTE — Telephone Encounter (Signed)
ATC X1 LVM for patient to call the office back to get him scheduled with Dr. Halford Chessman

## 2022-09-02 NOTE — Telephone Encounter (Signed)
Order placed for CT, please review

## 2022-09-10 ENCOUNTER — Ambulatory Visit (HOSPITAL_BASED_OUTPATIENT_CLINIC_OR_DEPARTMENT_OTHER)
Admission: RE | Admit: 2022-09-10 | Discharge: 2022-09-10 | Disposition: A | Discharge status: Home / Self Care | Payer: Medicare Other | Source: Ambulatory Visit | Attending: Pulmonary Disease | Admitting: Pulmonary Disease

## 2022-09-10 DIAGNOSIS — J439 Emphysema, unspecified: Secondary | ICD-10-CM | POA: Diagnosis not present

## 2022-09-10 DIAGNOSIS — J432 Centrilobular emphysema: Secondary | ICD-10-CM | POA: Diagnosis not present

## 2022-09-16 ENCOUNTER — Ambulatory Visit (HOSPITAL_BASED_OUTPATIENT_CLINIC_OR_DEPARTMENT_OTHER): Payer: Medicare Other | Admitting: Pulmonary Disease

## 2022-09-16 ENCOUNTER — Encounter (HOSPITAL_BASED_OUTPATIENT_CLINIC_OR_DEPARTMENT_OTHER): Payer: Self-pay | Admitting: Pulmonary Disease

## 2022-09-16 VITALS — BP 130/82 | HR 84 | Ht 72.0 in | Wt 206.6 lb

## 2022-09-16 DIAGNOSIS — R911 Solitary pulmonary nodule: Secondary | ICD-10-CM

## 2022-09-16 DIAGNOSIS — J432 Centrilobular emphysema: Secondary | ICD-10-CM | POA: Diagnosis not present

## 2022-09-16 DIAGNOSIS — Z720 Tobacco use: Secondary | ICD-10-CM | POA: Diagnosis not present

## 2022-09-16 NOTE — Progress Notes (Signed)
[facility]  Chief Complaint  Patient presents with   Follow-up    Scan results   Past Surgical History:  He  has a past surgical history that includes Total hip arthroplasty; Tonsillectomy; A flutter ablation; implantable loop recorder insertion (09/11/2019); ATRIAL FIBRILLATION ABLATION (N/A, 11/07/2019); and Implantable loop recorder removal.  Past Medical History:  Colon polyps, ED, PAF, Psoriasis, TIA 2004, PNA  Constitutional:  BP 130/82 (BP Location: Left Arm, Cuff Size: Normal)   Pulse 84   Ht 6' (1.829 m)   Wt 206 lb 9.6 oz (93.7 kg)   SpO2 98%   BMI 28.02 kg/m   Brief Summary:  Lawrence Rivas is a 72 y.o. male smoker with dyspnea from steroid responsive pneumonitis with symptoms starting after viral respiratory infection in April 2022.      Subjective:   CT chest from February shows improvement in most nodules.  He has some smaller new nodules and areas of tree in bud.  Still smokes 1/2 to 1 ppd.  He walks 5 miles per day.  He gets sinus congestion, post nasal drip and this triggers a cough.  Not having fever, sweats, weight loss, chest pain, wheeze, or hemoptysis.  Physical Exam:   Appearance - well kempt   ENMT - no sinus tenderness, no oral exudate, no LAN, Mallampati 3 airway, no stridor  Respiratory - equal breath sounds bilaterally, no wheezing or rales  CV - s1s2 regular rate and rhythm, no murmurs  Ext - no clubbing, no edema  Skin - no rashes  Psych - normal mood and affect      Pulmonary testing:  Serology 01/21/21 >> ANA, RF, ANCA negative; ESR 98 PFT 03/19/21 >> FEV1 3.09 (88%), FEV1% 74, TLC 6.71 (90%), DLCO 81%  Chest Imaging:  CT chest 02/03/21 >> atherosclerosis; centrilobular emphysema; patchy areas of GGO, consolidation an BTX (suggestive of post COVID sequelae) HRCT chest 08/18/21 >> atherosclerosis, decreased consolidation and GGO but still mild residual GGO and reticulation, new  irregular nodular focus of consolidation 2.4 x 1.5 cm RLL, new 0.4 cm nodule RLL, scattered areas of cylindrical BTX b/l (suggestive of COP) PET scan 10/02/21 >> new nodular opacity RML 1.8 x 1.1 cm, RLL nodule decreased to 1.0 x 0.6 cm CT chest 09/10/22 >> tree in bud LLL, some new sub solid nodules but other ones resolved  Cardiac Tests:  Echo 01/01/21 >> EF 50 to 55%, mild LVH  Social History:  He  reports that he has been smoking cigarettes. He started smoking about 52 years ago. He has a 25.00 pack-year smoking history. He has never used smokeless tobacco. He reports current alcohol use. He reports that he does not use drugs.  Family History:  His family history includes Coronary artery disease (age of onset: 55) in his father; Diabetes in his maternal grandmother; Healthy in his son; Stroke in his mother; Uterine cancer in his mother.     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 - much improved - he has mild residual scarring and bronchiectasis - monitor clinically  Lung nodule. - likely inflammatory  - defer additional imaging unless he develops new symptoms   Tobacco abuse with centrilobular emphysema. - reviewed options to assist with smoking cessation - he doesn't feel like he needs inhaler therapy at this time  Upper airway cough with post nasal drip. - prn nasal irrigation  Joint pain. - followed by Dr. Bo Merino  with rheumatology  Time Spent Involved in Patient Care on Day of Examination:  29 minutes  Follow up:   Patient Instructions  Follow up in 6 months  Medication List:   Allergies as of 09/16/2022       Reactions   Watermelon [citrullus Vulgaris] Itching   Any melon   Coconut (cocos Nucifera) Hives        Medication List        Accurate as of September 16, 2022 10:53 AM. If you have any questions, ask your nurse or doctor.          STOP taking these medications    Azelastine  HCl 0.15 % Soln Commonly known as: Astepro Stopped by: Chesley Mires, MD   cetirizine 10 MG tablet Commonly known as: ZYRTEC Stopped by: Chesley Mires, MD   zolpidem 5 MG tablet Commonly known as: AMBIEN Stopped by: Chesley Mires, MD       TAKE these medications    aspirin 325 MG tablet aspirin 325 mg tablet  Take 1 tablet every day by oral route.   clobetasol ointment 0.05 % Commonly known as: TEMOVATE APPLY TO AFFECTED AREA AFTER BATHING. (GENERIC PLEASE)   diltiazem 120 MG 24 hr capsule Commonly known as: CARDIZEM CD TAKE 1 CAPSULE(120 MG) BY MOUTH DAILY   fluticasone 50 MCG/ACT nasal spray Commonly known as: FLONASE Place 1 spray into both nostrils daily as needed for allergies or rhinitis.   MULTIVITAMIN PO Take by mouth.        Signature:  Chesley Mires, MD [place] Pager - [phone] 09/16/2022, 10:53 AM

## 2022-09-16 NOTE — Patient Instructions (Signed)
Follow up in 6 months 

## 2022-09-17 DIAGNOSIS — K080 Exfoliation of teeth due to systemic causes: Secondary | ICD-10-CM | POA: Diagnosis not present

## 2022-09-24 ENCOUNTER — Telehealth: Payer: Self-pay | Admitting: Internal Medicine

## 2022-09-24 NOTE — Telephone Encounter (Signed)
[facility] to schedule their annual wellness visit. Appointment made for 10/05/2022.  Sherol Dade; Care Guide Ambulatory Clinical [facility] Direct Dial: [phone]

## 2022-09-25 ENCOUNTER — Telehealth: Payer: Self-pay | Admitting: Internal Medicine

## 2022-09-25 NOTE — Telephone Encounter (Signed)
Lawrence Rivas to schedule their annual wellness visit. Appointment made for 10/05/22. REQ CB to confirm appt date and time for his calendar.  Sherol Dade; Care Guide Ambulatory Clinical [facility] Direct Dial: [phone]

## 2022-10-05 ENCOUNTER — Ambulatory Visit (INDEPENDENT_AMBULATORY_CARE_PROVIDER_SITE_OTHER): Payer: Medicare Other | Admitting: *Deleted

## 2022-10-05 DIAGNOSIS — Z0000 Encounter for general adult medical examination without abnormal findings: Secondary | ICD-10-CM

## 2022-10-05 NOTE — Progress Notes (Signed)
Subjective:   Lawrence Rivas is a 72 y.o. male who presents for Medicare Annual/Subsequent preventive examination.  I connected with  Ival Bible on 10/05/22 by a audio enabled telemedicine application and verified that I am speaking with the correct person using two identifiers.  Patient Location: Home  Provider Location: Office/Clinic  I discussed the limitations of evaluation and management by telemedicine. The patient expressed understanding and agreed to proceed.   Review of Systems     Cardiac Risk Factors include: advanced age (>10men, >36 women)     Objective:    There were no vitals filed for this visit. There is no height or weight on file to calculate BMI.     10/05/2022    9:00 AM 09/29/2021    9:22 AM 11/07/2019    5:58 AM  Advanced Directives  Does Patient Have a Medical Advance Directive? Yes No Yes  Type of Paramedic of [place];Living will  [place];Living will  Does patient want to make changes to medical advance directive?   No - Patient declined  Copy of [place] in Chart? No - copy requested  No - copy requested    Current Medications (verified) Outpatient Encounter Medications as of 10/05/2022  Medication Sig   aspirin 325 MG tablet aspirin 325 mg tablet  Take 1 tablet every day by oral route.   clobetasol ointment (TEMOVATE) 0.05 % APPLY TO AFFECTED AREA AFTER BATHING. (GENERIC PLEASE)   diltiazem (CARDIZEM CD) 120 MG 24 hr capsule TAKE 1 CAPSULE(120 MG) BY MOUTH DAILY   fluticasone (FLONASE) 50 MCG/ACT nasal spray Place 1 spray into both nostrils daily as needed for allergies or rhinitis.   Multiple Vitamin (MULTIVITAMIN PO) Take by mouth.   No facility-administered encounter medications on file as of 10/05/2022.    Allergies (verified) Watermelon [citrullus vulgaris] and Coconut (cocos nucifera)   History: Past Medical History:  Diagnosis Date   Adenomatous colon polyp     TA and SS   COPD (chronic obstructive pulmonary disease) (HCC)    Erectile dysfunction    Numbness and tingling    right arm    Paroxysmal A-fib (HCC)    no recurrence s/p atrial flutter ablation (previously atrial flutter felt to degenerate into afib)   Psoriasis    TIA (transient ischemic attack) 2004   Past Surgical History:  Procedure Laterality Date   A FLUTTER ABLATION     March 2011 at [facility] by Dr Lyanne Co Dixat   ATRIAL FIBRILLATION ABLATION N/A 11/07/2019   Procedure: [place];  Surgeon: Thompson Grayer, MD;  Location: [facility];  Service: Cardiovascular;  Laterality: N/A;   implantable loop recorder insertion  09/11/2019   Medtronic Reveal Linq model LNQ 22 (SN [id number]) implantable loop recorder implanted by Dr Rayann Heman for afib management and evaluation of palpitations   Implantable loop recorder removal     MDT LINQ removal   TONSILLECTOMY     TOTAL HIP ARTHROPLASTY     right   Family History  Problem Relation Age of Onset   Stroke Mother        in her 11-90s   Uterine cancer Mother    Coronary artery disease Father 51   Diabetes Maternal Grandmother    Healthy Son    Colon cancer Neg Hx    Prostate cancer Neg Hx    Colon polyps Neg Hx    Esophageal cancer Neg Hx  Rectal cancer Neg Hx    Stomach cancer Neg Hx    Social History   Socioeconomic History   Marital status: Married    Spouse name: Not on file   Number of children: 1   Years of education: Not on file   Highest education level: Not on file  Occupational History   Occupation: Architect    Comment: [employer]    Occupation: retired     Fish farm manager: [employer]  Tobacco Use   Smoking status: Every Day    Packs/day: 0.50    Years: 50.00    Additional pack years: 0.00    Total pack years: 25.00    Types: Cigarettes    Start date: 69   Smokeless tobacco: Never   Tobacco comments:    < 1/2 pack a day as of 05-2021   Vaping Use   Vaping Use:  Never used  Substance and Sexual Activity   Alcohol use: Yes    Comment: 2 drinks of scotch per day   Drug use: No   Sexual activity: Not on file  Other Topics Concern   Not on file  Social History Narrative   Household: pt and wife     Social Determinants of Health   Financial Resource Strain: Low Risk  (09/29/2021)   Overall Financial Resource Strain (CARDIA)    Difficulty of Paying Living Expenses: Not hard at all  Food Insecurity: No Food Insecurity (10/05/2022)   Hunger Vital Sign    Worried About Running Out of Food in the Last Year: Never true    Ran Out of Food in the Last Year: Never true  Transportation Needs: No Transportation Needs (10/05/2022)   PRAPARE - Hydrologist (Medical): No    Lack of Transportation (Non-Medical): No  Physical Activity: Sufficiently Active (09/29/2021)   Exercise Vital Sign    Days of Exercise per Week: 7 days    Minutes of Exercise per Session: 120 min  Stress: No Stress Concern Present (09/29/2021)   [place]    Feeling of Stress : Only a little  Social Connections: Moderately Isolated (10/05/2022)   Social Connection and Isolation Panel [NHANES]    Frequency of Communication with Friends and Family: More than three times a week    Frequency of Social Gatherings with Friends and Family: Once a week    Attends Religious Services: Never    Marine scientist or Organizations: No    Attends Music therapist: Never    Marital Status: Married    Tobacco Counseling Ready to quit: Not Answered Counseling given: Not Answered Tobacco comments: < 1/2 pack a day as of 05-2021    Clinical Intake:  Pre-visit preparation completed: Yes  Pain : No/denies pain  Diabetes: No  How often do you need to have someone help you when you read instructions, pamphlets, or other written materials from your doctor or pharmacy?: 1 -  Never   Activities of Daily Living    10/05/2022    9:08 AM  In your present state of health, do you have any difficulty performing the following activities:  Hearing? 0  Vision? 0  Comment wears readers  Difficulty concentrating or making decisions? 0  Walking or climbing stairs? 0  Dressing or bathing? 0  Doing errands, shopping? 0  Preparing Food and eating ? N  Using the Toilet? N  In the past six months,  have you accidently leaked urine? Y  Do you have problems with loss of bowel control? N  Managing your Medications? N  Managing your Finances? N  Housekeeping or managing your Housekeeping? N    Patient Care Team: Colon Branch, MD as PCP - General (Internal Medicine) Thompson Grayer, MD (Inactive) as PCP - Cardiology (Cardiology) Justice Britain, MD as Consulting Physician (Orthopedic Surgery) Chesley Mires, MD as Consulting Physician (Pulmonary Disease) Danis, Kirke Corin, MD as Consulting Physician (Gastroenterology) Lavonna Monarch, MD (Inactive) as Consulting Physician (Dermatology)  Indicate any recent Medical Services you may have received from other than [facility] providers in the past year (date may be approximate).     Assessment:   This is a routine wellness examination for [place].  Hearing/Vision screen No results found.  Dietary issues and exercise activities discussed: Current Exercise Habits: Home exercise routine, Type of exercise: walking, Time (Minutes): > 60, Frequency (Times/Week): 7, Weekly Exercise (Minutes/Week): 0, Intensity: Mild, Exercise limited by: None identified   Goals Addressed   None    Depression Screen    10/05/2022    9:07 AM 05/21/2022    9:22 AM 11/18/2021    9:12 AM 09/29/2021    9:30 AM 11/14/2020   11:47 AM 11/11/2020    8:54 AM 01/02/2020    9:20 AM  PHQ 2/9 Scores  PHQ - 2 Score 0 0 0 0 2 0 0  PHQ- 9 Score     4  3    Fall Risk    10/05/2022    9:02 AM 05/21/2022    9:21 AM 11/18/2021    9:12 AM 09/29/2021    9:27 AM 11/14/2020    11:17 AM  Fall Risk   Falls in the past year? 0 0 0 0 0  Number falls in past yr: 0 0 0 0 0  Injury with Fall? 0 0 0 0 0  Risk for fall due to : No Fall Risks      Follow up Falls evaluation completed  Falls evaluation completed Falls prevention discussed Falls evaluation completed    [place]:  Any stairs in or around the home? Yes  If so, are there any without handrails? No  Home free of loose throw rugs in walkways, pet beds, electrical cords, etc? Yes  Adequate lighting in your home to reduce risk of falls? Yes   ASSISTIVE DEVICES UTILIZED TO PREVENT FALLS:  Life alert? No  Use of a cane, walker or w/c? No  Grab bars in the bathroom? No  Shower chair or bench in shower? No  Elevated toilet seat or a handicapped toilet?  Comfort height  TIMED UP AND GO:  Was the test performed?  No, audio visit .   Cognitive Function:        10/05/2022    9:17 AM  6CIT Screen  What Year? 0 points  What month? 0 points  What time? 0 points  Count back from 20 0 points  Months in reverse 0 points  Repeat phrase 0 points  Total Score 0 points    Immunizations Immunization History  Administered Date(s) Administered   Fluad Quad(high Dose 65+) 04/01/2020, 04/28/2021   Influenza Split 05/01/2014   Influenza,inj,Quad PF,6+ Mos 06/05/2013   Influenza,inj,quad, With Preservative 04/13/2019   Influenza-Unspecified 05/17/2016, 04/11/2022   PFIZER(Purple Top)SARS-COV-2 Vaccination 07/29/2019, 08/19/2019, 10/22/2020   Pfizer Covid-19 Vaccine Bivalent Booster 99yrs & up 05/26/2021, 04/11/2022   Pneumococcal Conjugate-13 04/01/2020   Pneumococcal  Polysaccharide-23 06/18/2011   Tdap 06/18/2011, 05/26/2021   Zoster, Live 06/05/2013    TDAP status: Up to date  Flu Vaccine status: Up to date  Pneumococcal vaccine status: Due, Education has been provided regarding the importance of this vaccine. Advised may receive this vaccine at local pharmacy or  Health Dept. Aware to provide a copy of the vaccination record if obtained from local pharmacy or Health Dept. Verbalized acceptance and understanding.  Covid-19 vaccine status: Information provided on how to obtain vaccines.   Qualifies for Shingles Vaccine? Yes   Zostavax completed Yes   Shingrix Completed?: No.    Education has been provided regarding the importance of this vaccine. Patient has been advised to call insurance company to determine out of pocket expense if they have not yet received this vaccine. Advised may also receive vaccine at local pharmacy or Health Dept. Verbalized acceptance and understanding.  Screening Tests Health Maintenance  Topic Date Due   Zoster Vaccines- Shingrix (1 of 2) Never done   COVID-19 Vaccine (6 - 2023-24 season) 06/06/2022   Medicare Annual Wellness (AWV)  09/30/2022   Pneumonia Vaccine 101+ Years old (3 of 3 - PPSV23 or PCV20) 11/19/2022 (Originally 04/01/2021)   Lung Cancer Screening  09/10/2023   COLONOSCOPY (Pts 45-80yrs Insurance coverage will need to be confirmed)  08/28/2024   DTaP/Tdap/Td (3 - Td or Tdap) 05/27/2031   INFLUENZA VACCINE  Completed   Hepatitis C Screening  Completed   HPV VACCINES  Aged Out    Health Maintenance  Health Maintenance Due  Topic Date Due   Zoster Vaccines- Shingrix (1 of 2) Never done   COVID-19 Vaccine (6 - 2023-24 season) 06/06/2022   Medicare Annual Wellness (AWV)  09/30/2022    Colorectal cancer screening: Type of screening: Colonoscopy. Completed 08/28/21. Repeat every 3 years  Lung Cancer Screening: (Low Dose CT Chest recommended if Age 39-80 years, 30 pack-year currently smoking OR have quit w/in 15years.) does qualify.   Lung Cancer Screening Referral: already in system and last scan was completed on 09/10/22.  Additional Screening:  Hepatitis C Screening: does qualify; Completed 11/15/20  Vision Screening: Recommended annual ophthalmology exams for early detection of glaucoma and other  disorders of the eye. Is the patient up to date with their annual eye exam?  No  Who is the provider or what is the name of the office in which the patient attends annual eye exams? [facility] If pt is not established with a provider, would they like to be referred to a provider to establish care? No .   Dental Screening: Recommended annual dental exams for proper oral hygiene  Community Resource Referral / Chronic Care Management: CRR required this visit?  No   CCM required this visit?  No      Plan:     I have personally reviewed and noted the following in the patient's chart:   Medical and social history Use of alcohol, tobacco or illicit drugs  Current medications and supplements including opioid prescriptions. Patient is not currently taking opioid prescriptions. Functional ability and status Nutritional status Physical activity Advanced directives List of other physicians Hospitalizations, surgeries, and ER visits in previous 12 months Vitals Screenings to include cognitive, depression, and falls Referrals and appointments  In addition, I have reviewed and discussed with patient certain preventive protocols, quality metrics, and best practice recommendations. A written personalized care plan for preventive services as well as general preventive health recommendations were provided to patient.   Due  to this being a telephonic visit, the after visit summary with patients personalized plan was offered to patient via mail or my-chart. Patient would like to access on my-chart.  Beatris Ship, [place]   10/05/2022   Nurse Notes: None

## 2022-10-05 NOTE — Patient Instructions (Signed)
Mr. Lawrence Rivas , Thank you for taking time to come for your Medicare Wellness Visit. I appreciate your ongoing commitment to your health goals. Please review the following plan we discussed and let me know if I can assist you in the future.     This is a list of the screening recommended for you and due dates:  Health Maintenance  Topic Date Due   Zoster (Shingles) Vaccine (1 of 2) Never done   COVID-19 Vaccine (6 - 2023-24 season) 06/06/2022   Pneumonia Vaccine (3 of 3 - PPSV23 or PCV20) 11/19/2022*   Screening for Lung Cancer  09/10/2023   Medicare Annual Wellness Visit  10/05/2023   Colon Cancer Screening  08/28/2024   DTaP/Tdap/Td vaccine (3 - Td or Tdap) 05/27/2031   Flu Shot  Completed   Hepatitis C Screening: USPSTF Recommendation to screen - Ages 18-79 yo.  Completed   HPV Vaccine  Aged Out  *Topic was postponed. The date shown is not the original due date.    Next appointment: Follow up in one year for your annual wellness visit.   Preventive Care 73 Years and Older, Male Preventive care refers to lifestyle choices and visits with your health care provider that can promote health and wellness. What does preventive care include? A yearly physical exam. This is also called an annual well check. Dental exams once or twice a year. Routine eye exams. Ask your health care provider how often you should have your eyes checked. Personal lifestyle choices, including: Daily care of your teeth and gums. Regular physical activity. Eating a healthy diet. Avoiding tobacco and drug use. Limiting alcohol use. Practicing safe sex. Taking low doses of aspirin every day. Taking vitamin and mineral supplements as recommended by your health care provider. What happens during an annual well check? The services and screenings done by your health care provider during your annual well check will depend on your age, overall health, lifestyle risk factors, and family history of disease. Counseling   Your health care provider may ask you questions about your: Alcohol use. Tobacco use. Drug use. Emotional well-being. Home and relationship well-being. Sexual activity. Eating habits. History of falls. Memory and ability to understand (cognition). Work and work Statistician. Screening  You may have the following tests or measurements: Height, weight, and BMI. Blood pressure. Lipid and cholesterol levels. These may be checked every 5 years, or more frequently if you are over 73 years old. Skin check. Lung cancer screening. You may have this screening every year starting at age 73 if you have a 30-pack-year history of smoking and currently smoke or have quit within the past 15 years. Fecal occult blood test (FOBT) of the stool. You may have this test every year starting at age 73. Flexible sigmoidoscopy or colonoscopy. You may have a sigmoidoscopy every 5 years or a colonoscopy every 10 years starting at age 73. Prostate cancer screening. Recommendations will vary depending on your family history and other risks. Hepatitis C blood test. Hepatitis B blood test. Sexually transmitted disease (STD) testing. Diabetes screening. This is done by checking your blood sugar (glucose) after you have not eaten for a while (fasting). You may have this done every 1-3 years. Abdominal aortic aneurysm (AAA) screening. You may need this if you are a current or former smoker. Osteoporosis. You may be screened starting at age 73 if you are at high risk. Talk with your health care provider about your test results, treatment options, and if necessary, the need for  more tests. Vaccines  Your health care provider may recommend certain vaccines, such as: Influenza vaccine. This is recommended every year. Tetanus, diphtheria, and acellular pertussis (Tdap, Td) vaccine. You may need a Td booster every 10 years. Zoster vaccine. You may need this after age 73. Pneumococcal 13-valent conjugate (PCV13) vaccine.  One dose is recommended after age 34. Pneumococcal polysaccharide (PPSV23) vaccine. One dose is recommended after age 26. Talk to your health care provider about which screenings and vaccines you need and how often you need them. This information is not intended to replace advice given to you by your health care provider. Make sure you discuss any questions you have with your health care provider. Document Released: 07/26/2015 Document Revised: 03/18/2016 Document Reviewed: 04/30/2015 Elsevier Interactive Patient Education  2017 [employer] Prevention in the Home Falls can cause injuries. They can happen to people of all ages. There are many things you can do to make your home safe and to help prevent falls. What can I do on the outside of my home? Regularly fix the edges of walkways and driveways and fix any cracks. Remove anything that might make you trip as you walk through a door, such as a raised step or threshold. Trim any bushes or trees on the path to your home. Use bright outdoor lighting. Clear any walking paths of anything that might make someone trip, such as rocks or tools. Regularly check to see if handrails are loose or broken. Make sure that both sides of any steps have handrails. Any raised decks and porches should have guardrails on the edges. Have any leaves, snow, or ice cleared regularly. Use sand or salt on walking paths during winter. Clean up any spills in your garage right away. This includes oil or grease spills. What can I do in the bathroom? Use night lights. Install grab bars by the toilet and in the tub and shower. Do not use towel bars as grab bars. Use non-skid mats or decals in the tub or shower. If you need to sit down in the shower, use a plastic, non-slip stool. Keep the floor dry. Clean up any water that spills on the floor as soon as it happens. Remove soap buildup in the tub or shower regularly. Attach bath mats securely with double-sided  non-slip rug tape. Do not have throw rugs and other things on the floor that can make you trip. What can I do in the bedroom? Use night lights. Make sure that you have a light by your bed that is easy to reach. Do not use any sheets or blankets that are too big for your bed. They should not hang down onto the floor. Have a firm chair that has side arms. You can use this for support while you get dressed. Do not have throw rugs and other things on the floor that can make you trip. What can I do in the kitchen? Clean up any spills right away. Avoid walking on wet floors. Keep items that you use a lot in easy-to-reach places. If you need to reach something above you, use a strong step stool that has a grab bar. Keep electrical cords out of the way. Do not use floor polish or wax that makes floors slippery. If you must use wax, use non-skid floor wax. Do not have throw rugs and other things on the floor that can make you trip. What can I do with my stairs? Do not leave any items on the stairs. Make  sure that there are handrails on both sides of the stairs and use them. Fix handrails that are broken or loose. Make sure that handrails are as Lawrence Rivas as the stairways. Check any carpeting to make sure that it is firmly attached to the stairs. Fix any carpet that is loose or worn. Avoid having throw rugs at the top or bottom of the stairs. If you do have throw rugs, attach them to the floor with carpet tape. Make sure that you have a light switch at the top of the stairs and the bottom of the stairs. If you do not have them, ask someone to add them for you. What else can I do to help prevent falls? Wear shoes that: Do not have high heels. Have rubber bottoms. Are comfortable and fit you well. Are closed at the toe. Do not wear sandals. If you use a stepladder: Make sure that it is fully opened. Do not climb a closed stepladder. Make sure that both sides of the stepladder are locked into place. Ask  someone to hold it for you, if possible. Clearly mark and make sure that you can see: Any grab bars or handrails. First and last steps. Where the edge of each step is. Use tools that help you move around (mobility aids) if they are needed. These include: Canes. Walkers. Scooters. Crutches. Turn on the lights when you go into a dark area. Replace any light bulbs as soon as they burn out. Set up your furniture so you have a clear path. Avoid moving your furniture around. If any of your floors are uneven, fix them. If there are any pets around you, be aware of where they are. Review your medicines with your doctor. Some medicines can make you feel dizzy. This can increase your chance of falling. Ask your doctor what other things that you can do to help prevent falls. This information is not intended to replace advice given to you by your health care provider. Make sure you discuss any questions you have with your health care provider. Document Released: 04/25/2009 Document Revised: 12/05/2015 Document Reviewed: 08/03/2014 Elsevier Interactive Patient Education  2017 [employer].

## 2022-10-26 DIAGNOSIS — K080 Exfoliation of teeth due to systemic causes: Secondary | ICD-10-CM | POA: Diagnosis not present

## 2022-11-02 DIAGNOSIS — K080 Exfoliation of teeth due to systemic causes: Secondary | ICD-10-CM | POA: Diagnosis not present

## 2022-11-20 ENCOUNTER — Ambulatory Visit (INDEPENDENT_AMBULATORY_CARE_PROVIDER_SITE_OTHER): Payer: Medicare Other | Admitting: Internal Medicine

## 2022-11-20 ENCOUNTER — Encounter: Payer: Self-pay | Admitting: Internal Medicine

## 2022-11-20 VITALS — BP 136/70 | HR 70 | Temp 98.1°F | Resp 18 | Ht 72.0 in | Wt 209.4 lb

## 2022-11-20 DIAGNOSIS — Z720 Tobacco use: Secondary | ICD-10-CM | POA: Diagnosis not present

## 2022-11-20 DIAGNOSIS — I480 Paroxysmal atrial fibrillation: Secondary | ICD-10-CM

## 2022-11-20 DIAGNOSIS — J432 Centrilobular emphysema: Secondary | ICD-10-CM | POA: Diagnosis not present

## 2022-11-20 DIAGNOSIS — Z8679 Personal history of other diseases of the circulatory system: Secondary | ICD-10-CM | POA: Diagnosis not present

## 2022-11-20 LAB — BASIC METABOLIC PANEL
BUN: 19 mg/dL (ref 6–23)
CO2: 29 mEq/L (ref 19–32)
Calcium: 8.9 mg/dL (ref 8.4–10.5)
Chloride: 102 mEq/L (ref 96–112)
Creatinine, Ser: 0.75 mg/dL (ref 0.40–1.50)
GFR: 90.13 mL/min (ref >60.00)
Glucose, Bld: 97 mg/dL (ref 70–99)
Potassium: 5 mEq/L (ref 3.5–5.1)
Sodium: 136 mEq/L (ref 135–145)

## 2022-11-20 LAB — CBC WITH DIFFERENTIAL/PLATELET
Basophils Absolute: 0.1 10*3/uL (ref 0.0–0.1)
Basophils Relative: 0.9 % (ref 0.0–3.0)
Eosinophils Absolute: 0.2 10*3/uL (ref 0.0–0.7)
Eosinophils Relative: 2.9 % (ref 0.0–5.0)
HCT: 42.3 % (ref 39.0–52.0)
Hemoglobin: 14.5 g/dL (ref 13.0–17.0)
Lymphocytes Relative: 34.7 % (ref 12.0–46.0)
Lymphs Abs: 2.2 10*3/uL (ref 0.7–4.0)
MCHC: 34.2 g/dL (ref 30.0–36.0)
MCV: 101.1 fl — ABNORMAL HIGH (ref 78.0–100.0)
Monocytes Absolute: 0.8 10*3/uL (ref 0.1–1.0)
Monocytes Relative: 12.3 % — ABNORMAL HIGH (ref 3.0–12.0)
Neutro Abs: 3.1 10*3/uL (ref 1.4–7.7)
Neutrophils Relative %: 49.2 % (ref 43.0–77.0)
Platelets: 220 10*3/uL (ref 150.0–400.0)
RBC: 4.18 Mil/uL — ABNORMAL LOW (ref 4.22–5.81)
RDW: 13.5 % (ref 11.5–15.5)
WBC: 6.3 10*3/uL (ref 4.0–10.5)

## 2022-11-20 MED ORDER — FLUTICASONE PROPIONATE 50 MCG/ACT NA SUSP: 1.0000 | Freq: Every day | NASAL | 5 refills | Status: AC | PRN

## 2022-11-20 NOTE — Progress Notes (Signed)
Subjective:    Patient ID: Lawrence Rivas, male    DOB: December 23, 1950, 72 y.o.   MRN: [id number]  DOS:  11/20/2022 Type of visit - description: 75-month follow-up  Since last visit saw pulmonary, note reviewed. In general feeling well. Denies chest pain or difficulty breathing. No palpitations. Has developed pain right hip- lateral side, mostly when he walks.   Review of Systems See above   Past Medical History:  Diagnosis Date   Adenomatous colon polyp    TA and SS   COPD (chronic obstructive pulmonary disease) (HCC)    Erectile dysfunction    Numbness and tingling    right arm    Paroxysmal A-fib (HCC)    no recurrence s/p atrial flutter ablation (previously atrial flutter felt to degenerate into afib)   Psoriasis    TIA (transient ischemic attack) 2004    Past Surgical History:  Procedure Laterality Date   A FLUTTER ABLATION     March 2011 at [facility] by Dr Ruben Reason Dixat   ATRIAL FIBRILLATION ABLATION N/A 11/07/2019   Procedure: ATRIAL FIBRILLATION ABLATION;  Surgeon: Hillis Range, MD;  [facility];  Service: Cardiovascular;  Laterality: N/A;   implantable loop recorder insertion  09/11/2019   Medtronic Reveal Linq model LNQ 22 (SN [id number]) implantable loop recorder implanted by Dr Johney Frame for afib management and evaluation of palpitations   Implantable loop recorder removal     MDT LINQ removal   TONSILLECTOMY     TOTAL HIP ARTHROPLASTY     right    Current Outpatient Medications  Medication Instructions   aspirin 325 MG tablet aspirin 325 mg tablet  Take 1 tablet every day by oral route.   clobetasol ointment (TEMOVATE) 0.05 % APPLY TO AFFECTED AREA AFTER BATHING. (GENERIC PLEASE)   diltiazem (CARDIZEM CD) 120 MG 24 hr capsule TAKE 1 CAPSULE(120 MG) BY MOUTH DAILY   fluticasone (FLONASE) 50 MCG/ACT nasal spray 1 spray, Each Nare, Daily PRN   Multiple Vitamin (MULTIVITAMIN PO) Oral       Objective:   Physical Exam BP 136/70    Pulse 70   Temp 98.1 F (36.7 C) (Oral)   Resp 18   Ht 6' (1.829 m)   Wt 209 lb 6 oz (95 kg)   SpO2 97%   BMI 28.40 kg/m  General:   Well developed, NAD, BMI noted. HEENT:  Normocephalic . Face symmetric, atraumatic Lungs:  CTA B Normal respiratory effort, no intercostal retractions, no accessory muscle use. Heart: RRR,  no murmur.  Lower extremities: no pretibial edema bilaterally  Skin: Not pale. Not jaundice Neurologic:  alert & oriented X3.  Speech normal, gait appropriate for age and unassisted Psych--  Cognition and judgment appear intact.  Cooperative with normal attention span and concentration.  Behavior appropriate. No anxious or depressed appearing.      Assessment     ASSESSMENT P- Atrial fibrillation: Ablation remotely, symptoms resurfaced 2021, loop implantation,  started Xarelto 09-2019, had an ablation April 2021.  Xarelto DC 02-2020.  Loop recorder explanted 02-2022. Tobacco abuse Erectile dysfunction TIA 2004   Pulmonary: Emphysema per CT 01-2021, interstitial lung disease dx ~  08/2021, R lung nodule + FH CAD Father   PLAN History of atrial fibrillation: Asymptomatic. Tobacco abuse: Smoking less, counseled. Interstitial lung disease, COPD: Overall feels really good, saw Dr. Craige Cotta (pulmonary) 09/16/2022, he was noted to be much better. Check a BMP and CBC. R hip pain: Has developed pain with walking,  plans to see Ortho. Preventive care: Had a COVID vaccine 03-2022, per CDC recommendations needs a booster.  Patient aware.  Also recommended PNM 20. RTC 6 months CPX

## 2022-11-20 NOTE — Assessment & Plan Note (Signed)
History of atrial fibrillation: Asymptomatic. Tobacco abuse: Smoking less, counseled. Interstitial lung disease, COPD: Overall feels really good, saw Dr. Craige Cotta (pulmonary) 09/16/2022, he was noted to be much better. Check a BMP and CBC. R hip pain: Has developed pain with walking, plans to see Ortho. Preventive care: Had a COVID vaccine 03-2022, per CDC recommendations needs a booster.  Patient aware.  Also recommended PNM 20. RTC 6 months CPX

## 2022-11-20 NOTE — Patient Instructions (Addendum)
Vaccines I recommend: COVID booster Pneumonia shot (PNM 20)   GO TO THE LAB : Get the blood work     GO TO THE FRONT DESK, PLEASE SCHEDULE YOUR APPOINTMENTS Come back for a physical exam by November 2024  Please bring [place] a copy of your Healthcare Power of Attorney for your chart.

## 2022-11-26 DIAGNOSIS — K080 Exfoliation of teeth due to systemic causes: Secondary | ICD-10-CM | POA: Diagnosis not present

## 2022-12-11 ENCOUNTER — Ambulatory Visit (INDEPENDENT_AMBULATORY_CARE_PROVIDER_SITE_OTHER): Payer: Medicare Other | Admitting: Family Medicine

## 2022-12-11 ENCOUNTER — Encounter: Payer: Self-pay | Admitting: Family Medicine

## 2022-12-11 VITALS — BP 132/78 | HR 70 | Temp 98.0°F | Ht 72.0 in | Wt 207.4 lb

## 2022-12-11 DIAGNOSIS — L989 Disorder of the skin and subcutaneous tissue, unspecified: Secondary | ICD-10-CM

## 2022-12-11 MED ORDER — CLOTRIMAZOLE-BETAMETHASONE 1-0.05 % EX CREA
1.0000 | TOPICAL_CREAM | Freq: Every day | CUTANEOUS | 0 refills | Status: DC
Start: 2022-12-11 — End: 2023-03-24

## 2022-12-11 NOTE — Progress Notes (Addendum)
CC: Rash  Lawrence Rivas is a 72 y.o. male here for a skin complaint. Here w spouse.   Duration: 2 days Location: L lower abd Pruritic? No Painful? Yes- mild burning Drainage? No New soaps/lotions/topicals/detergents? No Sick contacts? No Other associated symptoms: no fevers, may be getting smaller per wife Therapies tried thus far: OTC cortisone cream  Past Medical History:  Diagnosis Date   Adenomatous colon polyp    TA and SS   COPD (chronic obstructive pulmonary disease) (HCC)    Erectile dysfunction    Numbness and tingling    right arm    Paroxysmal A-fib (HCC)    no recurrence s/p atrial flutter ablation (previously atrial flutter felt to degenerate into afib)   Psoriasis    TIA (transient ischemic attack) 2004    BP 132/78 (BP Location: Left Arm, Cuff Size: Normal)   Pulse 70   Temp 98 F (36.7 C) (Oral)   Ht 6' (1.829 m)   Wt 207 lb 6 oz (94.1 kg)   SpO2 97%   BMI 28.13 kg/m  Gen: awake, alert, appearing stated age Lungs: No accessory muscle use Skin: 7 x 3.5 cm lesion that is beefy red. 3 discreet areas with peeled back skin and pinpoint bleeding. No drainage, TTP, fluctuance, induration.  Psych: Age appropriate judgment and insight  Skin lesion - Plan: clotrimazole-betamethasone (LOTRISONE) cream  Will cover for fungal and inflammation. TAO over excoriated area. If no improvement, will have to punch biopsy an area.  The patient and spouse voiced understanding and agreement to the plan.  Jilda Roche [name], DO 12/11/22 11:03 AM

## 2022-12-11 NOTE — Patient Instructions (Signed)
Try not to scratch as this can make things worse. Avoid scented products while dealing with this. You may resume when the itchiness resolves. Cold/cool compresses can help.   When you do wash it, use only soap and water. Do not vigorously scrub. Apply triple antibiotic ointment (like Neosporin) twice daily over the peeling areas. Keep the area clean and dry.   Things to look out for: increasing pain not relieved by ibuprofen/acetaminophen, fevers, spreading redness, drainage of pus, or foul odor.  Come back in 10-14 days if no better.   Let us know if you need anything.

## 2022-12-16 DIAGNOSIS — Z471 Aftercare following joint replacement surgery: Secondary | ICD-10-CM | POA: Diagnosis not present

## 2022-12-16 DIAGNOSIS — Z96641 Presence of right artificial hip joint: Secondary | ICD-10-CM | POA: Diagnosis not present

## 2022-12-29 ENCOUNTER — Ambulatory Visit: Payer: Medicare Other | Attending: Cardiology | Admitting: Cardiology

## 2022-12-29 ENCOUNTER — Encounter: Payer: Self-pay | Admitting: Cardiology

## 2022-12-29 VITALS — BP 144/90 | HR 81 | Ht 72.0 in | Wt 204.0 lb

## 2022-12-29 DIAGNOSIS — J432 Centrilobular emphysema: Secondary | ICD-10-CM

## 2022-12-29 DIAGNOSIS — R072 Precordial pain: Secondary | ICD-10-CM

## 2022-12-29 DIAGNOSIS — Z720 Tobacco use: Secondary | ICD-10-CM

## 2022-12-29 DIAGNOSIS — I480 Paroxysmal atrial fibrillation: Secondary | ICD-10-CM

## 2022-12-29 DIAGNOSIS — I4510 Unspecified right bundle-branch block: Secondary | ICD-10-CM

## 2022-12-29 DIAGNOSIS — R0789 Other chest pain: Secondary | ICD-10-CM | POA: Diagnosis not present

## 2022-12-29 MED ORDER — METOPROLOL TARTRATE 100 MG PO TABS: ORAL_TABLET | ORAL | 0 refills | Status: DC

## 2022-12-29 NOTE — Addendum Note (Signed)
Addended by: Baldo Ash D on: 12/29/2022 09:44 AM   Modules accepted: Orders

## 2022-12-29 NOTE — Progress Notes (Signed)
Cardiology Consultation:    Date:  12/29/2022   ID:  Lawrence Rivas, DOB 1951-04-24, MRN [id number]  PCP:  Lawrence Plump, MD  Cardiologist:  Lawrence Balsam, MD   Referring MD: Lawrence Plump, MD   Chief Complaint  Patient presents with   Follow-up    History of Present Illness:    Lawrence Rivas is a 72 y.o. male who is being seen today for the evaluation of history of atrial fibrillation, status post atrial fibrillation ablation at the request of Lawrence Plump, MD. past medical history significant for paroxysmal atrial fibrillation, he did have ablation done 8 years ago in [place] however in spite of that he still have recurrences of arrhythmia eventually in 2021 he did have ablation done by Lawrence. Johney Rivas since that time he does not have any palpitations.  He clearly felt his palpitations.  Additional problem include essential hypertension, smoking which is still ongoing.  He came to me because he would like to be established as a patient.  Overall he is doing quite well he said that he walks every single day trying to do 10,000 steps interestingly he also tells me when he tried to walk he will get tightness in the chest he describes as pressure then he kept going and sensation gets better.  It never stopped him from doing his exercises but is clearly noticeable.  He never had any coronary artery disease.  I do not see any CAD workup done previously.  His risk factors include smoking, hypertension.  He also got family history of coronary artery disease.  He is not on any special diet he is very active.  Denies have any palpitations since ablation time.  He is in our office with his wife.  I asked about snoring she tells me that he does snore and stop breathing at night.  When we initiated conversation about potentially doing CPAP mask if needed he said absolutely he will not do that.  Past Medical History:  Diagnosis Date   Adenomatous colon polyp    TA and SS   COPD (chronic  obstructive pulmonary disease) (HCC)    Erectile dysfunction    Numbness and tingling    right arm    Paroxysmal A-fib (HCC)    no recurrence s/p atrial flutter ablation (previously atrial flutter felt to degenerate into afib)   Psoriasis    TIA (transient ischemic attack) 2004    Past Surgical History:  Procedure Laterality Date   A FLUTTER ABLATION     March 2011 at [facility] by Lawrence Rivas   ATRIAL FIBRILLATION ABLATION N/A 11/07/2019   Procedure: ATRIAL FIBRILLATION ABLATION;  Surgeon: Lawrence Range, MD;  [facility];  Service: Cardiovascular;  Laterality: N/A;   implantable loop recorder insertion  09/11/2019   Medtronic Reveal Linq model LNQ 22 (SN [id number]) implantable loop recorder implanted by Lawrence Lawrence Rivas for afib management and evaluation of palpitations   Implantable loop recorder removal     MDT LINQ removal   TONSILLECTOMY     TOTAL HIP ARTHROPLASTY Right 2007   in [place]    Current Medications: Current Meds  Medication Sig   aspirin 325 MG tablet Take 325 mg by mouth daily.   clobetasol ointment (TEMOVATE) 0.05 % APPLY TO AFFECTED AREA AFTER BATHING. (GENERIC PLEASE) (Patient taking differently: Apply 1 Application topically daily as needed (rash). APPLY TO AFFECTED AREA AFTER BATHING. (GENERIC PLEASE))   clotrimazole-betamethasone (LOTRISONE) cream Apply 1  Application topically daily. (Patient taking differently: Apply 1 Application topically daily as needed (rash).)   diltiazem (CARDIZEM CD) 120 MG 24 hr capsule TAKE 1 CAPSULE(120 MG) BY MOUTH DAILY (Patient taking differently: Take 120 mg by mouth daily. TAKE 1 CAPSULE(120 MG) BY MOUTH DAILY)   fluticasone (FLONASE) 50 MCG/ACT nasal spray Place 1 spray into both nostrils daily as needed for allergies or rhinitis.   Multiple Vitamin (MULTIVITAMIN PO) Take 1 tablet by mouth daily.     Allergies:   Watermelon [citrullus vulgaris] and Coconut (cocos nucifera)   Social History    Socioeconomic History   Marital status: Married    Spouse name: Not on file   Number of children: 1   Years of education: Not on file   Highest education level: Not on file  Occupational History   Occupation: Community education officer    Comment: [name]    Occupation: retired     Associate Professor: [employer]  Tobacco Use   Smoking status: Every Day    Packs/day: 0.50    Years: 50.00    Additional pack years: 0.00    Total pack years: 25.00    Types: Cigarettes    Start date: 12   Smokeless tobacco: Never   Tobacco comments:    < 1/2 pack a day as of 05-2021   Vaping Use   Vaping Use: Never used  Substance and Sexual Activity   Alcohol use: Yes    Comment: 2 drinks of scotch per day   Drug use: No   Sexual activity: Not on file  Other Topics Concern   Not on file  Social History Narrative   Household: pt and wife     Social Determinants of Health   Financial Resource Strain: Low Risk  (09/29/2021)   Overall Financial Resource Strain (CARDIA)    Difficulty of Paying Living Expenses: Not hard at all  Food Insecurity: No Food Insecurity (10/05/2022)   Hunger Vital Sign    Worried About Running Out of Food in the Last Year: Never true    Ran Out of Food in the Last Year: Never true  Transportation Needs: No Transportation Needs (10/05/2022)   PRAPARE - Administrator, Civil Service (Medical): No    Lack of Transportation (Non-Medical): No  Physical Activity: Sufficiently Active (09/29/2021)   Exercise Vital Sign    Days of Exercise per Week: 7 days    Minutes of Exercise per Session: 120 min  Stress: No Stress Concern Present (09/29/2021)   [name]    Feeling of Stress : Only a little  Social Connections: Moderately Isolated (10/05/2022)   Social Connection and Isolation Panel [NHANES]    Frequency of Communication with Friends and Family: More than three times a week    Frequency of Social Gatherings  with Friends and Family: Once a week    Attends Religious Services: Never    Database administrator or Organizations: No    Attends Engineer, structural: Never    Marital Status: Married     Family History: The patient's family history includes Coronary artery disease (age of onset: 9) in his father; Diabetes in his maternal grandmother; Healthy in his son; Stroke in his mother; Uterine cancer in his mother. There is no history of Colon cancer, Prostate cancer, Colon polyps, Esophageal cancer, Rectal cancer, or Stomach cancer. ROS:   Please see the history of present illness.  All 14 point review of systems negative except as described per history of present illness.  EKGs/Labs/Other Studies Reviewed:    The following studies were reviewed today:   EKG:  EKG is  ordered today.  The ekg ordered today demonstrates normal sinus rhythm, normal P interval, normal QS complex duration morphology except for right bundle branch block  Recent Labs: 05/21/2022: ALT 23; TSH 1.16 11/20/2022: BUN 19; Creatinine, Ser 0.75; Hemoglobin 14.5; Platelets 220.0; Potassium 5.0; Sodium 136  Recent Lipid Panel    Component Value Date/Time   CHOL 149 05/21/2022 1027   CHOL 174 07/29/2018 1201   TRIG 40.0 05/21/2022 1027   HDL 87.50 05/21/2022 1027   HDL 72 07/29/2018 1201   CHOLHDL 2 05/21/2022 1027   VLDL 8.0 05/21/2022 1027   LDLCALC 54 05/21/2022 1027   LDLCALC 68 04/01/2020 1531    Physical Exam:    VS:  BP (!) 144/90 (BP Location: Left Arm, Patient Position: Sitting)   Pulse 81   Ht 6' (1.829 m)   Wt 204 lb (92.5 kg)   SpO2 94%   BMI 27.67 kg/m     Wt Readings from Last 3 Encounters:  12/29/22 204 lb (92.5 kg)  12/11/22 207 lb 6 oz (94.1 kg)  11/20/22 209 lb 6 oz (95 kg)     GEN:  Well nourished, well developed in no acute distress HEENT: Normal NECK: No JVD; No carotid bruits LYMPHATICS: No lymphadenopathy CARDIAC: RRR, no murmurs, no rubs, no gallops RESPIRATORY:   Clear to auscultation without rales, wheezing or rhonchi  ABDOMEN: Soft, non-tender, non-distended MUSCULOSKELETAL:  No edema; No deformity  SKIN: Warm and dry NEUROLOGIC:  Alert and oriented x 3 PSYCHIATRIC:  Normal affect   ASSESSMENT:    1. Paroxysmal A-fib (HCC)   2. Atypical chest pain   3. Tobacco abuse   4. Centrilobular emphysema (HCC)    PLAN:    In order of problems listed above:  Atypical chest pain.  I will schedule him to have coronary CT angio to make sure he does not have any significant obstructive coronary artery disease.  He is taking only aspirin which I will continue. Right bundle branch block.  Will get echocardiogram to assess the right ventricle size and function.  Also previously noted his left ventricle ejection fraction was low limits of normal need to be verified to see if there is any need to update his medications. Smoking obviously huge problem spent great deal of time talking about this I strongly recommended to quit we spent at least 5 minutes talking about this. Cholesterol status I did review his cholesterol which looks actually quite good he is LDL is 54 HDL 87 this is from November of last year with no medications. I suspect he does have significant obstructive sleep apnea I did initiate conversation about potentially doing sleep study he is reluctant we will talk about this in the future.  For now echocardiogram coronary CT angio will be scheduled   Medication Adjustments/Labs and Tests Ordered: Current medicines are reviewed at length with the patient today.  Concerns regarding medicines are outlined above.  Orders Placed This Encounter  Procedures   EKG 12-Lead   No orders of the defined types were placed in this encounter.   Signed, Georgeanna Lea, MD, [place] [facility]. 12/29/2022 9:24 AM    [facility]

## 2022-12-29 NOTE — Patient Instructions (Signed)
Medication Instructions:   ON DAY OF CT SCAN:  Do Not take Cardizem  TAKE: Metoprolol 100mg  1 tablet 2 hours prior to CT scan   Lab Work: [address] 3rd Floor BMP- today If you have labs (blood work) drawn today and your tests are completely normal, you will receive your results only by: MyChart Message (if you have MyChart) OR A paper copy in the mail If you have any lab test that is abnormal or we need to change your treatment, we will call you to review the results.   Testing/Procedures:  Your physician has requested that you have an echocardiogram. Echocardiography is a painless test that uses sound waves to create images of your heart. It provides your doctor with information about the size and shape of your heart and how well your heart's chambers and valves are working. This procedure takes approximately one hour. There are no restrictions for this procedure. Please do NOT wear cologne, perfume, aftershave, or lotions (deodorant is allowed). Please arrive 15 minutes prior to your appointment time.     Your cardiac CT will be scheduled at one of the below locations:   [facility] [address] [place], [place] [address] [phone]   If scheduled at [facility], please arrive at the [facility] (Entrance C2) of [facility] 30 minutes prior to test start time. You can use the FREE valet parking offered at entrance C (encouraged to control the heart rate for the test)  Proceed to the [facility] Radiology Department (first floor) to check-in and test prep.  All radiology patients and guests should use entrance C2 at [facility], accessed from [facility], even though the hospital's physical address listed is [address]      Please follow these instructions carefully (unless otherwise directed):  Hold all erectile dysfunction medications at least 3 days (72 hrs) prior to test.  On  the Night Before the Test: Be sure to Drink plenty of water. Do not consume any caffeinated/decaffeinated beverages or chocolate 12 hours prior to your test. Do not take any antihistamines 12 hours prior to your test.   On the Day of the Test: Drink plenty of water until 1 hour prior to the test. Do not eat any food 4 hours prior to the test. You may take your regular medications prior to the test.  Take metoprolol (Lopressor) two hours prior to test.       After the Test: Drink plenty of water. After receiving IV contrast, you may experience a mild flushed feeling. This is normal. On occasion, you may experience a mild rash up to 24 hours after the test. This is not dangerous. If this occurs, you can take Benadryl 25 mg and increase your fluid intake. If you experience trouble breathing, this can be serious. If it is severe call 911 IMMEDIATELY. If it is mild, please call our office. If you take any of these medications: Glipizide/Metformin, Avandament, Glucavance, please do not take 48 hours after completing test unless otherwise instructed.  We will call to schedule your test 2-4 weeks out understanding that some insurance companies will need an authorization prior to the service being performed.   For non-scheduling related questions, please contact the cardiac imaging nurse navigator should you have any questions/concerns: Rockwell Alexandria, Cardiac Imaging Nurse Navigator Larey Brick, Cardiac Imaging Nurse Navigator [facility] Direct Office Dial: [phone]   For scheduling needs, including  cancellations and rescheduling, please call [place], [phone].    Follow-Up: At [facility], you and your health needs are our priority.  As part of our continuing mission to provide you with exceptional heart care, we have created designated Provider Care Teams.  These Care Teams include your primary Cardiologist (physician) and Advanced Practice Providers  (APPs -  Physician Assistants and Nurse Practitioners) who all work together to provide you with the care you need, when you need it.  We recommend signing up for the patient portal called "MyChart".  Sign up information is provided on this After Visit Summary.  MyChart is used to connect with patients for Virtual Visits (Telemedicine).  Patients are able to view lab/test results, encounter notes, upcoming appointments, etc.  Non-urgent messages can be sent to your provider as well.   To learn more about what you can do with MyChart, go to [email or web address].    Your next appointment:   3 month(s)  The format for your next appointment:   In Person  Provider:   Gypsy Balsam, MD    Other Instructions NA

## 2022-12-30 LAB — BASIC METABOLIC PANEL
BUN/Creatinine Ratio: 18 (ref 10–24)
BUN: 16 mg/dL (ref 8–27)
CO2: 23 mmol/L (ref 20–29)
Calcium: 9.4 mg/dL (ref 8.6–10.2)
Chloride: 102 mmol/L (ref 96–106)
Creatinine, Ser: 0.87 mg/dL (ref 0.76–1.27)
Glucose: 92 mg/dL (ref 70–99)
Potassium: 5 mmol/L (ref 3.5–5.2)
Sodium: 138 mmol/L (ref 134–144)
eGFR: 92 mL/min/{1.73_m2} (ref >59)

## 2023-01-05 ENCOUNTER — Encounter: Payer: Self-pay | Admitting: *Deleted

## 2023-01-06 ENCOUNTER — Telehealth: Payer: Self-pay

## 2023-01-06 ENCOUNTER — Telehealth (HOSPITAL_COMMUNITY): Payer: Self-pay | Admitting: *Deleted

## 2023-01-06 NOTE — Telephone Encounter (Signed)
Patient notified through my chart.

## 2023-01-06 NOTE — Telephone Encounter (Signed)
-----   Message from Georgeanna Lea, MD sent at 01/03/2023 10:12 AM EDT ----- Chem-7 looks good, continue with coronary CT angio

## 2023-01-06 NOTE — Telephone Encounter (Signed)
Reaching out to patient to offer assistance regarding upcoming cardiac imaging study; pt verbalizes understanding of appt date/time, parking situation and where to check in, pre-test NPO status and medications ordered, and verified current allergies; name and call back number provided for further questions should they arise Latiya Navia RN Navigator Cardiac Imaging [facility] [phone] office [phone] cell  

## 2023-01-07 ENCOUNTER — Ambulatory Visit (HOSPITAL_COMMUNITY)
Admission: RE | Admit: 2023-01-07 | Discharge: 2023-01-07 | Disposition: A | Discharge status: Home / Self Care | Payer: Medicare Other | Source: Ambulatory Visit | Attending: Cardiology | Admitting: Cardiology

## 2023-01-07 ENCOUNTER — Other Ambulatory Visit: Payer: Self-pay | Admitting: Cardiology

## 2023-01-07 DIAGNOSIS — R072 Precordial pain: Secondary | ICD-10-CM | POA: Insufficient documentation

## 2023-01-07 DIAGNOSIS — R931 Abnormal findings on diagnostic imaging of heart and coronary circulation: Secondary | ICD-10-CM

## 2023-01-07 DIAGNOSIS — I2510 Atherosclerotic heart disease of native coronary artery without angina pectoris: Secondary | ICD-10-CM

## 2023-01-07 MED ORDER — IOHEXOL 350 MG/ML SOLN
95.0000 mL | Freq: Once | INTRAVENOUS | Status: AC | PRN
  Administered 2023-01-07: 95 mL via INTRAVENOUS

## 2023-01-07 MED ORDER — NITROGLYCERIN 0.4 MG SL SUBL
0.8000 mg | SUBLINGUAL_TABLET | Freq: Once | SUBLINGUAL | Status: AC
  Administered 2023-01-07: 0.8 mg via SUBLINGUAL

## 2023-01-07 MED ORDER — NITROGLYCERIN 0.4 MG SL SUBL
SUBLINGUAL_TABLET | SUBLINGUAL | Status: AC
  Filled 2023-01-07: qty 2

## 2023-01-07 NOTE — Progress Notes (Signed)
Pre-procedure vitals: HR 55 BP 134/70  IV started 20g R ac Attempt x1 Pt tolerated well  Post-procedure vitals HR 54 BP 120/64  IV removed Catheter intact Bleeding controlled  Pt reports feeling well, denies dizziness, headache.  Pt arrived alone for appt, transported to H&V valet area.  Pt able to ambulate without assist, ready for DC.

## 2023-01-12 DIAGNOSIS — H6123 Impacted cerumen, bilateral: Secondary | ICD-10-CM | POA: Diagnosis not present

## 2023-01-12 DIAGNOSIS — H900 Conductive hearing loss, bilateral: Secondary | ICD-10-CM | POA: Diagnosis not present

## 2023-01-20 ENCOUNTER — Ambulatory Visit (HOSPITAL_BASED_OUTPATIENT_CLINIC_OR_DEPARTMENT_OTHER)
Admission: RE | Admit: 2023-01-20 | Discharge: 2023-01-20 | Disposition: A | Discharge status: Home / Self Care | Payer: Medicare Other | Source: Ambulatory Visit | Attending: Cardiology | Admitting: Cardiology

## 2023-01-20 DIAGNOSIS — I4510 Unspecified right bundle-branch block: Secondary | ICD-10-CM | POA: Insufficient documentation

## 2023-01-20 LAB — ECHOCARDIOGRAM COMPLETE
Area-P 1/2: 7.16 cm2
MV M vel: 3.34 m/s
MV Peak grad: 44.6 mmHg
S' Lateral: 3.9 cm

## 2023-01-22 ENCOUNTER — Telehealth: Payer: Self-pay

## 2023-01-22 NOTE — Telephone Encounter (Signed)
Results reviewed with pt as per Dr. Krasowski's note.  Pt verbalized understanding and had no additional questions. Routed to PCP  

## 2023-01-25 ENCOUNTER — Telehealth: Payer: Self-pay | Admitting: Cardiology

## 2023-01-25 NOTE — Telephone Encounter (Signed)
Called patient and he reported that he was seeing Dr. Johney Frame before he left [facility] and he recently received a letter informing him to schedule a follow up visit with the A-fib clinic. He is questioning whether he needs to follow up with the A-fib clinic or can he just consolidate his care with Dr. Bing Matter? If he needs to go to the A-fib clinic follow up appointment he will but he wanted to check with Dr. Bing Matter to verify if it was needed. Please advise.

## 2023-01-25 NOTE — Telephone Encounter (Signed)
Patient is calling stating the afib clinic has been contacting him to setup a follow up visit. He is wanting to know if Dr. Bing Matter feels this necessary because he would prefer to consolidate all heart care to one provider and location if possible.

## 2023-01-26 NOTE — Telephone Encounter (Signed)
Called patient and informed him of Dr. Vanetta Shawl recommendation regarding where to receive care:  It looks like we can take care of all this problems right here   Patient verbalized understanding and stated that he had his Cardiac Ct and it was approved but he received a letter from his insurance company stating that they were not going to pay for the test. I informed him that I would have to look into this and he was agreeable with this plan. Patient had no further questions at this time.

## 2023-01-28 NOTE — Telephone Encounter (Signed)
Sent message to pre-certification department to see if they can help determine why the patient Cardiac CTA was denied after it was pre-certified and the patient had the test performed.

## 2023-01-28 NOTE — Telephone Encounter (Signed)
Left message for the patient to call back.

## 2023-01-28 NOTE — Telephone Encounter (Signed)
Patient stated his insurance company is denying payment for his CT CARDIAC test and believe the test may have been coded incorrectly.  Patient wants call back to address this.

## 2023-02-01 ENCOUNTER — Other Ambulatory Visit: Payer: Self-pay | Admitting: Cardiology

## 2023-02-02 NOTE — Telephone Encounter (Signed)
Called patient and informed him that the forms showing that his Cardiac CTA was approved before he had the test are at the front desk and he could pick them up at his convience. Patient was appreciative for the call back and stated that he would pick up the forms on Thursday. Patient had no further questions at this time.

## 2023-02-11 DIAGNOSIS — K080 Exfoliation of teeth due to systemic causes: Secondary | ICD-10-CM | POA: Diagnosis not present

## 2023-03-17 ENCOUNTER — Ambulatory Visit: Payer: Medicare Other | Admitting: Cardiology

## 2023-03-24 ENCOUNTER — Ambulatory Visit: Payer: Medicare Other | Attending: Cardiology | Admitting: Cardiology

## 2023-03-24 ENCOUNTER — Encounter: Payer: Self-pay | Admitting: Cardiology

## 2023-03-24 VITALS — BP 128/70 | HR 87 | Ht 72.0 in | Wt 201.0 lb

## 2023-03-24 DIAGNOSIS — E785 Hyperlipidemia, unspecified: Secondary | ICD-10-CM

## 2023-03-24 DIAGNOSIS — Z720 Tobacco use: Secondary | ICD-10-CM

## 2023-03-24 DIAGNOSIS — J432 Centrilobular emphysema: Secondary | ICD-10-CM

## 2023-03-24 DIAGNOSIS — I2510 Atherosclerotic heart disease of native coronary artery without angina pectoris: Secondary | ICD-10-CM | POA: Insufficient documentation

## 2023-03-24 DIAGNOSIS — R0789 Other chest pain: Secondary | ICD-10-CM

## 2023-03-24 DIAGNOSIS — I480 Paroxysmal atrial fibrillation: Secondary | ICD-10-CM

## 2023-03-24 NOTE — Patient Instructions (Addendum)
Medication Instructions:   START: Lipitor 10mg  1 tablet daily   Lab Work: 3rd Floor   [address]  Your physician recommends that you return for lab work in:   6 weeks You need to have labs done when you are fasting.  You can come Monday through Friday 8:00 am to 11:30AM and 1:00 to 4:00. You do not need to make an appointment as the order has already been placed.      Testing/Procedures: None Ordered   Follow-Up: At [facility], you and your health needs are our priority.  As part of our continuing mission to provide you with exceptional heart care, we have created designated Provider Care Teams.  These Care Teams include your primary Cardiologist (physician) and Advanced Practice Providers (APPs -  Physician Assistants and Nurse Practitioners) who all work together to provide you with the care you need, when you need it.  We recommend signing up for the patient portal called "MyChart".  Sign up information is provided on this After Visit Summary.  MyChart is used to connect with patients for Virtual Visits (Telemedicine).  Patients are able to view lab/test results, encounter notes, upcoming appointments, etc.  Non-urgent messages can be sent to your provider as well.   To learn more about what you can do with MyChart, go to [email or web address].    Your next appointment:   5 month(s)  The format for your next appointment:   In Person  Provider:   Gypsy Balsam, MD    Other Instructions NA

## 2023-03-24 NOTE — Progress Notes (Signed)
Cardiology Office Note:    Date:  03/24/2023   ID:  Lawrence Rivas, DOB [birth year], MRN [id number]  PCP:  Wanda Plump, MD  Cardiologist:  Gypsy Balsam, MD    Referring MD: Wanda Plump, MD   Chief Complaint  Patient presents with   Results      History of Present Illness:    Lawrence NIMTZ is a 72 y.o. male past medical history significant for paroxysmal atrial fibrillation, he did have atrial fibrillation ablation done years ago in [place] after that in 2021 he had ablation done by Dr. Johney Frame.  Since that time of no episode of palpitations, additional problem include essential hypertension small, which is still ongoing.  He comes today to my office discussed results of the test with the coronary CT angio which showed moderate disease of RCA however FFR was negative, we did also echocardiogram which showed luminal lower limits of normal ejection fraction without significant valvular pathology.  However discussion today revolved mostly around his wife his wife is critically sick in hospital.  She is apparently have alcoholic cirrhotic liver and she got jaundice.  Majority of conversation revolved about him going to hospital spending 12 hours every single day with his wife.  He is very shaken by this a lot.  Past Medical History:  Diagnosis Date   Adenomatous colon polyp    TA and SS   COPD (chronic obstructive pulmonary disease) (HCC)    Erectile dysfunction    Numbness and tingling    right arm    Paroxysmal A-fib (HCC)    no recurrence s/p atrial flutter ablation (previously atrial flutter felt to degenerate into afib)   Psoriasis    TIA (transient ischemic attack) 2004    Past Surgical History:  Procedure Laterality Date   A FLUTTER ABLATION     March 2011 at [facility] by Dr Ruben Reason Dixat   ATRIAL FIBRILLATION ABLATION N/A 11/07/2019   Procedure: ATRIAL FIBRILLATION ABLATION;  Surgeon: Hillis Range, MD;  Location: [facility] INVASIVE CV LAB;  Service:  Cardiovascular;  Laterality: N/A;   implantable loop recorder insertion  09/11/2019   Medtronic Reveal Linq model LNQ 22 (SN [id number]) implantable loop recorder implanted by Dr Johney Frame for afib management and evaluation of palpitations   Implantable loop recorder removal     MDT LINQ removal   TONSILLECTOMY     TOTAL HIP ARTHROPLASTY Right 2007   in [place]    Current Medications: Current Meds  Medication Sig   aspirin 325 MG tablet Take 325 mg by mouth daily.   clobetasol ointment (TEMOVATE) 0.05 % APPLY TO AFFECTED AREA AFTER BATHING. (GENERIC PLEASE) (Patient taking differently: Apply 1 Application topically daily as needed (rash). APPLY TO AFFECTED AREA AFTER BATHING. (GENERIC PLEASE))   diltiazem (CARDIZEM CD) 120 MG 24 hr capsule TAKE 1 CAPSULE(120 MG) BY MOUTH DAILY (Patient taking differently: Take 120 mg by mouth daily. TAKE 1 CAPSULE(120 MG) BY MOUTH DAILY)   fluticasone (FLONASE) 50 MCG/ACT nasal spray Place 1 spray into both nostrils daily as needed for allergies or rhinitis.   Multiple Vitamin (MULTIVITAMIN PO) Take 1 tablet by mouth daily.   [DISCONTINUED] clotrimazole-betamethasone (LOTRISONE) cream Apply 1 Application topically daily. (Patient taking differently: Apply 1 Application topically daily as needed (rash).)   [DISCONTINUED] metoprolol tartrate (LOPRESSOR) 100 MG tablet Take one tablet 2 hours before cardiac CT for heart greater than 55     Allergies:   Watermelon [citrullus vulgaris] and Coconut (cocos nucifera)  Social History   Socioeconomic History   Marital status: Married    Spouse name: Not on file   Number of children: 1   Years of education: Not on file   Highest education level: Not on file  Occupational History   Occupation: Community education officer    Comment: [name]    Occupation: retired     Associate Professor: [employer]  Tobacco Use   Smoking status: Every Day    Current packs/day: 0.50    Average packs/day: 0.5 packs/day for 52.7 years (26.3 ttl  pk-yrs)    Types: Cigarettes    Start date: 58   Smokeless tobacco: Never   Tobacco comments:    < 1/2 pack a day as of 05-2021   Vaping Use   Vaping status: Never Used  Substance and Sexual Activity   Alcohol use: Yes    Comment: 2 drinks of scotch per day   Drug use: No   Sexual activity: Not on file  Other Topics Concern   Not on file  Social History Narrative   Household: pt and wife     Social Determinants of Health   Financial Resource Strain: Low Risk  (09/29/2021)   Overall Financial Resource Strain (CARDIA)    Difficulty of Paying Living Expenses: Not hard at all  Food Insecurity: No Food Insecurity (10/05/2022)   Hunger Vital Sign    Worried About Running Out of Food in the Last Year: Never true    Ran Out of Food in the Last Year: Never true  Transportation Needs: No Transportation Needs (10/05/2022)   PRAPARE - Administrator, Civil Service (Medical): No    Lack of Transportation (Non-Medical): No  Physical Activity: Sufficiently Active (09/29/2021)   Exercise Vital Sign    Days of Exercise per Week: 7 days    Minutes of Exercise per Session: 120 min  Stress: No Stress Concern Present (09/29/2021)   [name]    Feeling of Stress : Only a little  Social Connections: Moderately Isolated (10/05/2022)   Social Connection and Isolation Panel [NHANES]    Frequency of Communication with Friends and Family: More than three times a week    Frequency of Social Gatherings with Friends and Family: Once a week    Attends Religious Services: Never    Database administrator or Organizations: No    Attends Engineer, structural: Never    Marital Status: Married     Family History: The patient's family history includes Coronary artery disease (age of onset: 67) in his father; Diabetes in his maternal grandmother; Healthy in his son; Stroke in his mother; Uterine cancer in his mother. There  is no history of Colon cancer, Prostate cancer, Colon polyps, Esophageal cancer, Rectal cancer, or Stomach cancer. ROS:   Please see the history of present illness.    All 14 point review of systems negative except as described per history of present illness  EKGs/Labs/Other Studies Reviewed:         Recent Labs: 05/21/2022: ALT 23; TSH 1.16 11/20/2022: Hemoglobin 14.5; Platelets 220.0 12/29/2022: BUN 16; Creatinine, Ser 0.87; Potassium 5.0; Sodium 138  Recent Lipid Panel    Component Value Date/Time   CHOL 149 05/21/2022 1027   CHOL 174 07/29/2018 1201   TRIG 40.0 05/21/2022 1027   HDL 87.50 05/21/2022 1027   HDL 72 07/29/2018 1201   CHOLHDL 2 05/21/2022 1027   VLDL 8.0 05/21/2022 1027  LDLCALC 54 05/21/2022 1027   LDLCALC 68 04/01/2020 1531    Physical Exam:    VS:  BP 128/70 (BP Location: Left Arm, Patient Position: Sitting)   Pulse 87   Ht 6' (1.829 m)   Wt 201 lb (91.2 kg)   SpO2 96%   BMI 27.26 kg/m     Wt Readings from Last 3 Encounters:  03/24/23 201 lb (91.2 kg)  12/29/22 204 lb (92.5 kg)  12/11/22 207 lb 6 oz (94.1 kg)     GEN:  Well nourished, well developed in no acute distress HEENT: Normal NECK: No JVD; No carotid bruits LYMPHATICS: No lymphadenopathy CARDIAC: RRR, no murmurs, no rubs, no gallops RESPIRATORY:  Clear to auscultation without rales, wheezing or rhonchi  ABDOMEN: Soft, non-tender, non-distended MUSCULOSKELETAL:  No edema; No deformity  SKIN: Warm and dry LOWER EXTREMITIES: no swelling NEUROLOGIC:  Alert and oriented x 3 PSYCHIATRIC:  Normal affect   ASSESSMENT:    1. Dyslipidemia   2. Paroxysmal A-fib (HCC)   3. Centrilobular emphysema (HCC)   4. Tobacco abuse   5. Atypical chest pain   6. Coronary artery disease involving native coronary artery of native heart without angina pectoris    PLAN:    In order of problems listed above:  Coronary artery disease coronary CT angio showed moderate stenosis of RCA.  He is already  taking aspirin which I will continue.  I advised him to start statin and we had a long discussion about this.  He was very reluctant initially he said he will feel like an old man taking this and I took some time to convince him to start taking Lipitor 10 mg daily which he agree finally to do.  Will check his fasting lipid profile within next 6 weeks with AST LT. Tobacco abuse because we spent majority of time sitting in the hospital with his wife he smokes much less right now he said 5 to 8 cigarettes a day.  On my questioning if he would be able to quit smoking he said he will have to see. Atrial fibrillation denies have any episodes.  Not willing to modify any of his medication right now we will continue this discussion. Discussion was difficult today he is really shaken by the fact that his wife is very sick.  I bring him back in few months to continue this discussion.   Medication Adjustments/Labs and Tests Ordered: Current medicines are reviewed at length with the patient today.  Concerns regarding medicines are outlined above.  Orders Placed This Encounter  Procedures   ALT   AST   Lipid panel   Medication changes: No orders of the defined types were placed in this encounter.   Signed, Georgeanna Lea, MD, [facility] 03/24/2023 3:32 PM    [facility]

## 2023-03-29 ENCOUNTER — Telehealth: Payer: Self-pay | Admitting: Cardiology

## 2023-03-29 MED ORDER — ATORVASTATIN CALCIUM 10 MG PO TABS: 10.0000 mg | ORAL_TABLET | Freq: Every day | ORAL | 3 refills | Status: DC

## 2023-03-29 NOTE — Telephone Encounter (Signed)
Pt states that he was told at his appt he needed to start taking Lipitor but I do not see it in the chart. Pt states it was never called into the pharmacy as well. Please advise

## 2023-03-29 NOTE — Telephone Encounter (Signed)
Sent Lipitor 10mg  q d to [facility].

## 2023-03-29 NOTE — Addendum Note (Signed)
Addended by: Baldo Ash D on: 03/29/2023 02:14 PM   Modules accepted: Orders

## 2023-03-31 ENCOUNTER — Encounter (HOSPITAL_BASED_OUTPATIENT_CLINIC_OR_DEPARTMENT_OTHER): Payer: Self-pay | Admitting: Pulmonary Disease

## 2023-03-31 ENCOUNTER — Ambulatory Visit (HOSPITAL_BASED_OUTPATIENT_CLINIC_OR_DEPARTMENT_OTHER): Payer: Medicare Other | Admitting: Pulmonary Disease

## 2023-03-31 VITALS — BP 120/68 | HR 67 | Ht 72.0 in | Wt 202.0 lb

## 2023-03-31 DIAGNOSIS — Z23 Encounter for immunization: Secondary | ICD-10-CM | POA: Diagnosis not present

## 2023-03-31 DIAGNOSIS — Z636 Dependent relative needing care at home: Secondary | ICD-10-CM | POA: Diagnosis not present

## 2023-03-31 NOTE — Patient Instructions (Signed)
High dose flu shot today  Follow up in 6 months

## 2023-03-31 NOTE — Progress Notes (Signed)
[facility]  Chief Complaint  Patient presents with   Emphysema    Wife is in rehab   Past Surgical History:  He  has a past surgical history that includes Total hip arthroplasty (Right, 2007); Tonsillectomy; A flutter ablation; implantable loop recorder insertion (09/11/2019); ATRIAL FIBRILLATION ABLATION (N/A, 11/07/2019); and Implantable loop recorder removal.  Past Medical History:  Colon polyps, ED, PAF, Psoriasis, TIA 2004, PNA, HLD, CAD  Constitutional:  BP 120/68   Pulse 67   Ht 6' (1.829 m)   Wt 202 lb (91.6 kg)   SpO2 99%   BMI 27.40 kg/m   Brief Summary:  Lawrence Rivas is a 72 y.o. male smoker with pulmonary emphysema.  He had a steroid responsive pneumonitis with symptoms starting after viral respiratory infection in April 2022.      Subjective:   His wife was in hospital recently for an extended period of time and is now in rehab.  He feels drained having to juggle all his responsibilities.    He is still trying to keep up with his walking routine, and tries to get 6 to 7 thousand steps per day.  He isn't smoking as much - mostly because he can't smoke when he is in the rehab facility.  He is not having cough, wheeze, or chest congestion.  Physical Exam:   Appearance - well kempt   ENMT - no sinus tenderness, no oral exudate, no LAN, Mallampati 3 airway, no stridor  Respiratory - equal breath sounds bilaterally, no wheezing or rales  CV - s1s2 regular rate and rhythm, no murmurs  Ext - no clubbing, no edema  Skin - no rashes  Psych - normal mood and affect      Pulmonary testing:  Serology 01/21/21 >> ANA, RF, ANCA negative; ESR 98 PFT 03/19/21 >> FEV1 3.09 (88%), FEV1% 74, TLC 6.71 (90%), DLCO 81%  Chest Imaging:  CT chest 02/03/21 >> atherosclerosis; centrilobular emphysema; patchy areas of GGO, consolidation an BTX (suggestive of post COVID sequelae) HRCT chest 08/18/21 >> atherosclerosis,  decreased consolidation and GGO but still mild residual GGO and reticulation, new irregular nodular focus of consolidation 2.4 x 1.5 cm RLL, new 0.4 cm nodule RLL, scattered areas of cylindrical BTX b/l (suggestive of COP) PET scan 10/02/21 >> new nodular opacity RML 1.8 x 1.1 cm, RLL nodule decreased to 1.0 x 0.6 cm CT chest 09/10/22 >> tree in bud LLL, some new sub solid nodules but other ones resolved  Cardiac Tests:  Echo 01/20/23 >> EF 50 to 55%, mild elevation in PASP, mild MR  Social History:  He  reports that he has been smoking cigarettes. He started smoking about 52 years ago. He has a 26.4 pack-year smoking history. He has never used smokeless tobacco. He reports current alcohol use. He reports that he does not use drugs.  Family History:  His family history includes Coronary artery disease (age of onset: 68) in his father; Diabetes in his maternal grandmother; Healthy in his son; Stroke in his mother; Uterine cancer in his mother.     Assessment/Plan:   Tobacco abuse with centrilobular emphysema and mild regional bronchiectasis. - reviewed options to assist with smoking cessation - he doesn't feel like he needs inhaler therapy at this time  Upper airway cough with post nasal drip. - prn nasal irrigation  Joint pain. - followed by Dr. Pollyann Savoy with rheumatology  Time Spent Involved in Patient Care on Day of Examination:  26 minutes  Follow up:   Patient Instructions  High dose flu shot today  Follow up in 6 months  Medication List:   Allergies as of 03/31/2023       Reactions   Watermelon [citrullus Vulgaris] Itching   Any melon   Coconut (cocos Nucifera) Hives        Medication List        Accurate as of March 31, 2023  9:40 AM. If you have any questions, ask your nurse or doctor.          aspirin 325 MG tablet Take 325 mg by mouth daily.   atorvastatin 10 MG tablet Commonly known as: LIPITOR Take 1 tablet (10 mg total) by mouth  daily.   clobetasol ointment 0.05 % Commonly known as: TEMOVATE APPLY TO AFFECTED AREA AFTER BATHING. (GENERIC PLEASE) What changed:  how much to take how to take this when to take this reasons to take this   diltiazem 120 MG 24 hr capsule Commonly known as: CARDIZEM CD TAKE 1 CAPSULE(120 MG) BY MOUTH DAILY What changed: See the new instructions.   fluticasone 50 MCG/ACT nasal spray Commonly known as: FLONASE Place 1 spray into both nostrils daily as needed for allergies or rhinitis.   MULTIVITAMIN PO Take 1 tablet by mouth daily.        Signature:  Coralyn Helling, MD [facility] Pager - [phone] 03/31/2023, 9:40 AM

## 2023-05-05 DIAGNOSIS — E785 Hyperlipidemia, unspecified: Secondary | ICD-10-CM | POA: Diagnosis not present

## 2023-05-06 LAB — LIPID PANEL
Chol/HDL Ratio: 2.2 {ratio} (ref 0.0–5.0)
Cholesterol, Total: 143 mg/dL (ref 100–199)
HDL: 64 mg/dL (ref >39)
LDL Chol Calc (NIH): 68 mg/dL (ref 0–99)
Triglycerides: 47 mg/dL (ref 0–149)
VLDL Cholesterol Cal: 11 mg/dL (ref 5–40)

## 2023-05-06 LAB — ALT: ALT: 24 [IU]/L (ref 0–44)

## 2023-05-06 LAB — AST: AST: 21 [IU]/L (ref 0–40)

## 2023-05-10 DIAGNOSIS — K080 Exfoliation of teeth due to systemic causes: Secondary | ICD-10-CM | POA: Diagnosis not present

## 2023-05-17 ENCOUNTER — Telehealth: Payer: Self-pay

## 2023-05-17 NOTE — Telephone Encounter (Signed)
Lab Results reviewed with pt as per Dr. Vanetta Shawl note.  Pt verbalized understanding and had no additional questions. Routed to PCP

## 2023-05-24 ENCOUNTER — Ambulatory Visit (INDEPENDENT_AMBULATORY_CARE_PROVIDER_SITE_OTHER): Payer: Medicare Other

## 2023-05-24 ENCOUNTER — Encounter (INDEPENDENT_AMBULATORY_CARE_PROVIDER_SITE_OTHER): Payer: Self-pay

## 2023-05-24 VITALS — Ht 72.0 in | Wt 202.0 lb

## 2023-05-24 DIAGNOSIS — H6123 Impacted cerumen, bilateral: Secondary | ICD-10-CM

## 2023-05-24 NOTE — Progress Notes (Signed)
Dear Dr. Drue Novel, Here is my assessment for our mutual patient, Lawrence Rivas. Thank you for allowing me the opportunity to care for your patient. Please do not hesitate to contact me should you have any other questions. Sincerely, Burna Forts PA-C  Otolaryngology Clinic Note Referring provider: Dr. Drue Novel HPI:  Lawrence Rivas is a 72 y.o. male kindly referred by Dr. Drue Novel for evaluation of cerumen impaction.  The patient notes a history of cerumen impaction requiring removal.  He notes that he was last seen approximately 6 months ago and notes after the cerumen was removed his hearing was back to his baseline.  He notes that approximately 2 weeks ago he felt the ear was getting clogged again and had decreased hearing out of the left ear.  He reports a hearing evaluation several years ago but does not want to proceed with any further evaluation of his hearing at this time.  Denies any infectious signs or symptoms including fever, pain, drainage.   Independent Review of Additional Tests or Records:  None   PMH/Meds/All/SocHx/FamHx/ROS:   Past Medical History:  Diagnosis Date   Adenomatous colon polyp    TA and SS   COPD (chronic obstructive pulmonary disease) (HCC)    Erectile dysfunction    Numbness and tingling    right arm    Paroxysmal A-fib (HCC)    no recurrence s/p atrial flutter ablation (previously atrial flutter felt to degenerate into afib)   Psoriasis    TIA (transient ischemic attack) 2004     Past Surgical History:  Procedure Laterality Date   A FLUTTER ABLATION     March 2011 at [facility] by Dr Ruben Reason Dixat   ATRIAL FIBRILLATION ABLATION N/A 11/07/2019   Procedure: ATRIAL FIBRILLATION ABLATION;  Surgeon: Hillis Range, MD;  Location: [facility] INVASIVE CV LAB;  Service: Cardiovascular;  Laterality: N/A;   implantable loop recorder insertion  09/11/2019   Medtronic Reveal Linq model LNQ 22 (SN [id number]) implantable loop recorder implanted by Dr Johney Frame for afib  management and evaluation of palpitations   Implantable loop recorder removal     MDT LINQ removal   TONSILLECTOMY     TOTAL HIP ARTHROPLASTY Right 2007   in [place]    Family History  Problem Relation Age of Onset   Stroke Mother        in her 49-90s   Uterine cancer Mother    Coronary artery disease Father 72   Diabetes Maternal Grandmother    Healthy Son    Colon cancer Neg Hx    Prostate cancer Neg Hx    Colon polyps Neg Hx    Esophageal cancer Neg Hx    Rectal cancer Neg Hx    Stomach cancer Neg Hx      Social Connections: Moderately Isolated (10/05/2022)   Social Connection and Isolation Panel [NHANES]    Frequency of Communication with Friends and Family: More than three times a week    Frequency of Social Gatherings with Friends and Family: Once a week    Attends Religious Services: Never    Database administrator or Organizations: No    Attends Banker Meetings: Never    Marital Status: Married      Current Outpatient Medications:    atorvastatin (LIPITOR) 10 MG tablet, Take 1 tablet (10 mg total) by mouth daily., Disp: 90 tablet, Rfl: 3   fluticasone (FLONASE) 50 MCG/ACT nasal spray, Place 1 spray into both nostrils daily as needed for allergies or  rhinitis., Disp: 16 g, Rfl: 5   Multiple Vitamin (MULTIVITAMIN PO), Take 1 tablet by mouth daily., Disp: , Rfl:    aspirin 325 MG tablet, Take 325 mg by mouth daily. (Patient not taking: Reported on 05/24/2023), Disp: , Rfl:    clobetasol ointment (TEMOVATE) 0.05 %, APPLY TO AFFECTED AREA AFTER BATHING. (GENERIC PLEASE) (Patient not taking: Reported on 05/24/2023), Disp: 60 g, Rfl: 1   diltiazem (CARDIZEM CD) 120 MG 24 hr capsule, TAKE 1 CAPSULE(120 MG) BY MOUTH DAILY (Patient not taking: Reported on 05/24/2023), Disp: 90 capsule, Rfl: 1   Physical Exam:   Ht 6' (1.829 m)   Wt 202 lb (91.6 kg)   BMI 27.40 kg/m   Pertinent Findings    Bilateral EAC with impacted cerumen Weber 512: Equal Rinne  512: AC > BC b/l   Anterior rhinoscopy: Septum midline No lesions of oral cavity/oropharynx; dentition within normal limits No obviously palpable neck masses/lymphadenopathy/thyromegaly No respiratory distress or stridor  Seprately Identifiable Procedures:  Procedure: Bilateral ear microscopy and cerumen removal using microscope (CPT 16109) - Mod 25 Pre-procedure diagnosis: Cerumen impaction bilateral external ears Post-procedure diagnosis: same Indication:  cerumen impaction; given patient's otologic complaints and history as well as for improved and comprehensive examination of external ear and tympanic membrane, bilateral otologic examination using microscope was performed and impacted cerumen removed  Procedure: Patient was placed semi-recumbent. Both ear canals were examined using the microscope with findings above. Cerumen removed on left and on right using suction and currette with improvement in EAC examination and patency. Left: EAC was patent. TM was intact . Middle ear was aerated. Drainage: None Right: EAC was patent. TM was intact . Middle ear was aerated . Drainage: None Patient tolerated the procedure well.      Impression & Plans:  Lawrence Rivas is a 72 y.o. male with this is a 72 year old male seen in our office for evaluation of cerumen impaction.  He has a reoccurring history of the same.  I was able to disimpact to the bilateral external auditory canals.  Status post removal they were clear, normal TMs.  I offered audiologist evaluation as the patient did report a history of some age-related hearing changes, he does not want any further evaluation for his hearing at this time.  He has no other acute concerns at today's visit, encouraged him to return as needed or in 6 months for repeat evaluation.  The patient had no further questions or concerns at today's visit.   - f/u as needed   Thank you for allowing me the opportunity to care for your patient. Please do not  hesitate to contact me should you have any other questions.  Sincerely, Burna Forts PA-C [facility] Phone: [phone] Fax: [phone]  05/24/2023, 9:39 AM

## 2023-05-31 ENCOUNTER — Telehealth: Payer: Self-pay | Admitting: Cardiology

## 2023-05-31 NOTE — Telephone Encounter (Signed)
Pt will bring a copy of the denial to the [facility] office 06/01/23. Pt states the denial is for code 16109 which is the venipuncture.

## 2023-05-31 NOTE — Telephone Encounter (Signed)
Patient is requesting call back to discuss labs that were recently done and codes that could have possibly been entered wrong to insurance. Please advise.

## 2023-06-01 ENCOUNTER — Ambulatory Visit (INDEPENDENT_AMBULATORY_CARE_PROVIDER_SITE_OTHER): Payer: Medicare Other | Admitting: Internal Medicine

## 2023-06-01 ENCOUNTER — Encounter: Payer: Self-pay | Admitting: Internal Medicine

## 2023-06-01 VITALS — BP 136/80 | HR 79 | Temp 98.1°F | Resp 16 | Ht 72.0 in | Wt 207.4 lb

## 2023-06-01 DIAGNOSIS — Z0001 Encounter for general adult medical examination with abnormal findings: Secondary | ICD-10-CM

## 2023-06-01 DIAGNOSIS — I480 Paroxysmal atrial fibrillation: Secondary | ICD-10-CM | POA: Diagnosis not present

## 2023-06-01 DIAGNOSIS — R7989 Other specified abnormal findings of blood chemistry: Secondary | ICD-10-CM

## 2023-06-01 DIAGNOSIS — Z0000 Encounter for general adult medical examination without abnormal findings: Secondary | ICD-10-CM | POA: Diagnosis not present

## 2023-06-01 DIAGNOSIS — E785 Hyperlipidemia, unspecified: Secondary | ICD-10-CM | POA: Diagnosis not present

## 2023-06-01 DIAGNOSIS — I2510 Atherosclerotic heart disease of native coronary artery without angina pectoris: Secondary | ICD-10-CM | POA: Diagnosis not present

## 2023-06-01 LAB — CBC WITH DIFFERENTIAL/PLATELET
Basophils Absolute: 0 10*3/uL (ref 0.0–0.1)
Basophils Relative: 0.9 % (ref 0.0–3.0)
Eosinophils Absolute: 0.2 10*3/uL (ref 0.0–0.7)
Eosinophils Relative: 3.3 % (ref 0.0–5.0)
HCT: 42.4 % (ref 39.0–52.0)
Hemoglobin: 14.1 g/dL (ref 13.0–17.0)
Lymphocytes Relative: 36.7 % (ref 12.0–46.0)
Lymphs Abs: 2 10*3/uL (ref 0.7–4.0)
MCHC: 33.2 g/dL (ref 30.0–36.0)
MCV: 102.5 fL — ABNORMAL HIGH (ref 78.0–100.0)
Monocytes Absolute: 0.7 10*3/uL (ref 0.1–1.0)
Monocytes Relative: 12.9 % — ABNORMAL HIGH (ref 3.0–12.0)
Neutro Abs: 2.5 10*3/uL (ref 1.4–7.7)
Neutrophils Relative %: 46.2 % (ref 43.0–77.0)
Platelets: 221 10*3/uL (ref 150.0–400.0)
RBC: 4.14 Mil/uL — ABNORMAL LOW (ref 4.22–5.81)
RDW: 12.8 % (ref 11.5–15.5)
WBC: 5.3 10*3/uL (ref 4.0–10.5)

## 2023-06-01 LAB — BASIC METABOLIC PANEL
BUN: 17 mg/dL (ref 6–23)
CO2: 28 meq/L (ref 19–32)
Calcium: 8.9 mg/dL (ref 8.4–10.5)
Chloride: 103 meq/L (ref 96–112)
Creatinine, Ser: 0.71 mg/dL (ref 0.40–1.50)
GFR: 91.29 mL/min (ref >60.00)
Glucose, Bld: 99 mg/dL (ref 70–99)
Potassium: 4.8 meq/L (ref 3.5–5.1)
Sodium: 136 meq/L (ref 135–145)

## 2023-06-01 LAB — PSA: PSA: 0.56 ng/mL (ref 0.10–4.00)

## 2023-06-01 LAB — B12 AND FOLATE PANEL
Folate: 12.4 ng/mL (ref >5.9)
Vitamin B-12: 262 pg/mL (ref 211–911)

## 2023-06-01 NOTE — Progress Notes (Signed)
Subjective:    Patient ID: Lawrence Rivas, male    DOB: [birth year], 72 y.o.   MRN: [id number]  DOS:  06/01/2023 Type of visit - description: CPX  Here for CPX. Chronic medical problems addressed.  In general feels well.  Denies chest pain or difficulty breathing. Still smoking, states he is not ready to quit.   Review of Systems  Other than above, a 14 point review of systems is negative     Past Medical History:  Diagnosis Date   Adenomatous colon polyp    TA and SS   COPD (chronic obstructive pulmonary disease) (HCC)    Erectile dysfunction    Numbness and tingling    right arm    Paroxysmal A-fib (HCC)    no recurrence s/p atrial flutter ablation (previously atrial flutter felt to degenerate into afib)   Psoriasis    TIA (transient ischemic attack) 2004    Past Surgical History:  Procedure Laterality Date   A FLUTTER ABLATION     March 2011 at [facility] by Dr Ruben Reason Dixat   ATRIAL FIBRILLATION ABLATION N/A 11/07/2019   Procedure: ATRIAL FIBRILLATION ABLATION;  Surgeon: Hillis Range, MD;  [facility];  Service: Cardiovascular;  Laterality: N/A;   implantable loop recorder insertion  09/11/2019   Medtronic Reveal Linq model LNQ 22 (SN [id number]) implantable loop recorder implanted by Dr Johney Frame for afib management and evaluation of palpitations   Implantable loop recorder removal     MDT LINQ removal   TONSILLECTOMY     TOTAL HIP ARTHROPLASTY Right 2007   in [place]   Social History   Socioeconomic History   Marital status: Married    Spouse name: Not on file   Number of children: 1   Years of education: Not on file   Highest education level: Not on file  Occupational History   Occupation: Community education officer    Comment: [name]    Occupation: retired     Associate Professor: [employer]  Tobacco Use   Smoking status: Every Day    Current packs/day: 0.50    Average packs/day: 0.5 packs/day for 52.9 years (26.4 ttl pk-yrs)     Types: Cigarettes    Start date: 72   Smokeless tobacco: Never   Tobacco comments:    < 1/2 pack a day as of 05/2023  Vaping Use   Vaping status: Never Used  Substance and Sexual Activity   Alcohol use: Yes    Comment: 2 drinks of scotch per day   Drug use: No   Sexual activity: Not on file  Other Topics Concern   Not on file  Social History Narrative   Household: pt and wife     Social Determinants of Health   Financial Resource Strain: Low Risk  (09/29/2021)   Overall Financial Resource Strain (CARDIA)    Difficulty of Paying Living Expenses: Not hard at all  Food Insecurity: No Food Insecurity (10/05/2022)   Hunger Vital Sign    Worried About Running Out of Food in the Last Year: Never true    Ran Out of Food in the Last Year: Never true  Transportation Needs: No Transportation Needs (10/05/2022)   PRAPARE - Administrator, Civil Service (Medical): No    Lack of Transportation (Non-Medical): No  Physical Activity: Sufficiently Active (09/29/2021)   Exercise Vital Sign    Days of Exercise per Week: 7 days    Minutes of Exercise per Session:  120 min  Stress: No Stress Concern Present (09/29/2021)   [name]    Feeling of Stress : Only a little  Social Connections: Moderately Isolated (10/05/2022)   Social Connection and Isolation Panel [NHANES]    Frequency of Communication with Friends and Family: More than three times a week    Frequency of Social Gatherings with Friends and Family: Once a week    Attends Religious Services: Never    Database administrator or Organizations: No    Attends Banker Meetings: Never    Marital Status: Married  Catering manager Violence: Not At Risk (10/05/2022)   Humiliation, Afraid, Rape, and Kick questionnaire    Fear of Current or Ex-Partner: No    Emotionally Abused: No    Physically Abused: No    Sexually Abused: No    Current Outpatient  Medications  Medication Instructions   aspirin EC 81 mg, Oral, Daily, Swallow whole.   atorvastatin (LIPITOR) 10 mg, Oral, Daily   clobetasol ointment (TEMOVATE) 0.05 % APPLY TO AFFECTED AREA AFTER BATHING. (GENERIC PLEASE)   diltiazem (CARDIZEM CD) 120 MG 24 hr capsule TAKE 1 CAPSULE(120 MG) BY MOUTH DAILY   fluticasone (FLONASE) 50 MCG/ACT nasal spray 1 spray, Each Nare, Daily PRN       Objective:   Physical Exam BP 136/80   Pulse 79   Temp 98.1 F (36.7 C) (Oral)   Resp 16   Ht 6' (1.829 m)   Wt 207 lb 6 oz (94.1 kg)   SpO2 97%   BMI 28.13 kg/m  General: Well developed, NAD, BMI noted Neck: No  thyromegaly  HEENT:  Normocephalic . Face symmetric, atraumatic Lungs:  CTA B Normal respiratory effort, no intercostal retractions, no accessory muscle use. Heart: RRR,  no murmur.  Abdomen:  Not distended, soft, non-tender. No rebound or rigidity.   Lower extremities: no pretibial edema bilaterally  Skin: Exposed areas without rash. Not pale. Not jaundice Neurologic:  alert & oriented X3.  Speech normal, gait appropriate for age and unassisted Strength symmetric and appropriate for age.  Psych: Cognition and judgment appear intact.  Cooperative with normal attention span and concentration.  Behavior appropriate. No anxious or depressed appearing.     Assessment     ASSESSMENT CV: ---P- Atrial fibrillation: Ablation remotely, sxs again 2021, loop implantation,  started Xarelto 09-2019, ablation April 2021.  Xarelto DC 02-2020.  Loop recorder explanted 02-2022. --- CAD per CT coronary angio 12/2022 Dyslipidemia  Tobacco abuse Erectile dysfunction TIA 2004   Pulmonary: Emphysema per CT 01-2021, interstitial lung disease dx ~  08/2021, R lung nodule H/o psoriasis (used to see derm)    PLAN Here for CPX Td 2022 Zoster 2014  PNM : 23 2012; PNM 13: 2021 RSV done per patient Had a flu shot, also recent COVID-vaccine. Rec: shingrix  CCS:  Reports a Cscope 2009,  elsewhere, "benign polyps", cscope 02/2020, multiple polyps. C-scope 12/2021, 1 polyp, next per GI.  Dr. Myrtie Neither Prostate ca screening: Asymptomatic.  Check PSA Diet, exercise: Discussed Tobacco: Counseled, not ready to quit, consider nicotine supplements etc?   "I have tried them all". Labs reviewed: Will get a BMP CBC B12 folic acid PSA Healthcare POA: Information provided. CAD: Per CT coronary angiography.  No symptoms.  Will not take aspirin 325 mg because excessive skin bleeding, we compromise on aspirin 81 mg daily. Check BMP, CBC. High cholesterol: On atorvastatin.  Recent FLP okay Abnormal CBC:  Increased MCV, he had has "about 2 drinks a day", his wife is an alcoholic -sober at present.  Encouraged alcohol moderation, check B12 and folic acid. RTC 6 months

## 2023-06-01 NOTE — Assessment & Plan Note (Signed)
Here for CPX Td 2022 Zoster 2014  PNM : 23 2012; PNM 13: 2021 RSV done per patient Had a flu shot, also recent COVID-vaccine. Rec: shingrix  CCS:  Reports a Cscope 2009, elsewhere, "benign polyps", cscope 02/2020, multiple polyps. C-scope 12/2021, 1 polyp, next per GI.  Dr. Myrtie Neither Prostate ca screening: Asymptomatic.  Check PSA Diet, exercise: Discussed Tobacco: Counseled, not ready to quit, consider nicotine supplements etc?   "I have tried them all". Labs reviewed: Will get a BMP CBC B12 folic acid PSA Healthcare POA: Information provided.

## 2023-06-01 NOTE — Patient Instructions (Addendum)
Start aspirin 81 mg: 1 tablet daily.  Please read information about a healthcare power of attorney  Vaccines I recommend: Shingrix (shingles)     GO TO THE LAB : Get the blood work     Next visit with me in 6 months Please schedule it at the front desk      "Health Care Power of attorney" ,  "Living will" (Advance care planning documents)  If you already have a living will or healthcare power of attorney, is recommended you bring the copy to be scanned in your chart.   The document will be available to all the doctors you see in the system.  Advance care planning is a process that supports adults in  understanding and sharing their preferences regarding future medical care.  The patient's preferences are recorded in documents called Advance Directives and the can be modified at any time while the patient is in full mental capacity.   If you don't have one, please consider create one.      More information at: [email or web address]

## 2023-06-01 NOTE — Assessment & Plan Note (Signed)
Here for CPX CAD: Per CT coronary angiography.  No symptoms.  Will not take aspirin 325 mg because excessive skin bleeding, we compromise on aspirin 81 mg daily. Check BMP, CBC. High cholesterol: On atorvastatin.  Recent FLP okay Abnormal CBC: Increased MCV, he had has "about 2 drinks a day", his wife is an alcoholic -sober at present.  Encouraged alcohol moderation, check B12 and folic acid. RTC 6 months

## 2023-07-26 ENCOUNTER — Other Ambulatory Visit: Payer: Self-pay | Admitting: Cardiology

## 2023-07-27 NOTE — Telephone Encounter (Signed)
 Attempted several times to reach the patient regarding this matter and no response. I will at this point, await for the patient to return our call.

## 2023-07-29 DIAGNOSIS — K080 Exfoliation of teeth due to systemic causes: Secondary | ICD-10-CM | POA: Diagnosis not present

## 2023-07-30 ENCOUNTER — Encounter: Payer: Self-pay | Admitting: Cardiology

## 2023-07-30 ENCOUNTER — Ambulatory Visit: Payer: Medicare Other | Attending: Cardiology | Admitting: Cardiology

## 2023-07-30 VITALS — BP 142/76 | HR 78 | Ht 72.0 in | Wt 212.0 lb

## 2023-07-30 DIAGNOSIS — J432 Centrilobular emphysema: Secondary | ICD-10-CM | POA: Diagnosis not present

## 2023-07-30 DIAGNOSIS — I2510 Atherosclerotic heart disease of native coronary artery without angina pectoris: Secondary | ICD-10-CM | POA: Diagnosis not present

## 2023-07-30 DIAGNOSIS — E785 Hyperlipidemia, unspecified: Secondary | ICD-10-CM | POA: Diagnosis not present

## 2023-07-30 DIAGNOSIS — I480 Paroxysmal atrial fibrillation: Secondary | ICD-10-CM

## 2023-07-30 NOTE — Progress Notes (Signed)
Cardiology Office Note:    Date:  07/30/2023   ID:  Lawrence Rivas, DOB 07/20/1950, MRN [id number]  PCP:  Wanda Plump, MD  Cardiologist:  Gypsy Balsam, MD    Referring MD: Wanda Plump, MD   Chief Complaint  Patient presents with   Follow-up    History of Present Illness:    Lawrence Rivas is a 73 y.o. male past medical history significant for paroxysmal atrial fibrillation status post ablation done years ago in [place] after that 2021 ablation done by Dr. Johney Frame since that time no palpitations additional problem include essential hypertension, coronary artery disease he did have coronary CT angio which showed moderate stenosis of RCA however FFR was negative echocardiogram showed lower limits of normal ejection fraction.  Comes today to months for follow-up doing much better his wife is out of the hospital home and he is happy because of that.  Denies have any chest pain tightness squeezing pressure when chest tube continues to smoke  Past Medical History:  Diagnosis Date   Adenomatous colon polyp    TA and SS   COPD (chronic obstructive pulmonary disease) (HCC)    Erectile dysfunction    Numbness and tingling    right arm    Paroxysmal A-fib (HCC)    no recurrence s/p atrial flutter ablation (previously atrial flutter felt to degenerate into afib)   Psoriasis    TIA (transient ischemic attack) 2004    Past Surgical History:  Procedure Laterality Date   A FLUTTER ABLATION     [name]   ATRIAL FIBRILLATION ABLATION N/A 11/07/2019   Procedure: ATRIAL FIBRILLATION ABLATION;  Surgeon: Hillis Range, MD;  Location: [facility];  Service: Cardiovascular;  Laterality: N/A;   implantable loop recorder insertion  09/11/2019   Medtronic Reveal Linq model LNQ 22 (SN [id number]) implantable loop recorder implanted by Dr Johney Frame for afib management and evaluation of palpitations   Implantable loop recorder removal     MDT  LINQ removal   TONSILLECTOMY     TOTAL HIP ARTHROPLASTY Right 2007   in     Current Medications: Current Meds  Medication Sig   aspirin EC 81 MG tablet Take 81 mg by mouth daily. Swallow whole.   atorvastatin (LIPITOR) 10 MG tablet Take 1 tablet (10 mg total) by mouth daily.   clobetasol ointment (TEMOVATE) 0.05 % APPLY TO AFFECTED AREA AFTER BATHING. (GENERIC PLEASE) (Patient taking differently: Apply 1 Application topically See admin instructions. APPLY TO AFFECTED AREA AFTER BATHING. (GENERIC PLEASE))   diltiazem (CARDIZEM CD) 120 MG 24 hr capsule Take 1 capsule (120 mg total) by mouth daily.   fluticasone (FLONASE) 50 MCG/ACT nasal spray Place 1 spray into both nostrils daily as needed for allergies or rhinitis.     Allergies:   Watermelon [citrullus vulgaris] and Coconut (cocos nucifera)   Social History   Socioeconomic History   Marital status: Married    Spouse name: Not on file   Number of children: 1   Years of education: Not on file   Highest education level: Not on file  Occupational History   Occupation: Community education officer    Comment: [name]    Occupation: retired     Associate Professor: [employer]  Tobacco Use   Smoking status: Every Day    Current packs/day: 0.50    Average packs/day: 0.5 packs/day for 53.0 years (26.5 ttl pk-yrs)    Types: Cigarettes  Start date: 27   Smokeless tobacco: Never   Tobacco comments:    < 1/2 pack a day as of 05/2023  Vaping Use   Vaping status: Never Used  Substance and Sexual Activity   Alcohol use: Yes    Comment: 2 drinks of scotch per day   Drug use: No   Sexual activity: Not on file  Other Topics Concern   Not on file  Social History Narrative   Household: pt and wife     Social Drivers of Corporate investment banker Strain: Low Risk  (09/29/2021)   Overall Financial Resource Strain (CARDIA)    Difficulty of Paying Living Expenses: Not hard at all  Food Insecurity: No Food Insecurity (10/05/2022)   Hunger  Vital Sign    Worried About Running Out of Food in the Last Year: Never true    Ran Out of Food in the Last Year: Never true  Transportation Needs: No Transportation Needs (10/05/2022)   PRAPARE - Administrator, Civil Service (Medical): No    Lack of Transportation (Non-Medical): No  Physical Activity: Sufficiently Active (09/29/2021)   Exercise Vital Sign    Days of Exercise per Week: 7 days    Minutes of Exercise per Session: 120 min  Stress: No Stress Concern Present (09/29/2021)   [name]    Feeling of Stress : Only a little  Social Connections: Moderately Isolated (10/05/2022)   Social Connection and Isolation Panel [NHANES]    Frequency of Communication with Friends and Family: More than three times a week    Frequency of Social Gatherings with Friends and Family: Once a week    Attends Religious Services: Never    Database administrator or Organizations: No    Attends Engineer, structural: Never    Marital Status: Married     Family History: The patient's family history includes Coronary artery disease (age of onset: 7) in his father; Diabetes in his maternal grandmother; Healthy in his son; Stroke in his mother; Uterine cancer in his mother. There is no history of Colon cancer, Prostate cancer, Colon polyps, Esophageal cancer, Rectal cancer, or Stomach cancer. ROS:   Please see the history of present illness.    All 14 point review of systems negative except as described per history of present illness  EKGs/Labs/Other Studies Reviewed:         Recent Labs: 05/05/2023: ALT 24 06/01/2023: BUN 17; Creatinine, Ser 0.71; Hemoglobin 14.1; Platelets 221.0; Potassium 4.8; Sodium 136  Recent Lipid Panel    Component Value Date/Time   CHOL 143 05/05/2023 0823   TRIG 47 05/05/2023 0823   HDL 64 05/05/2023 0823   CHOLHDL 2.2 05/05/2023 0823   CHOLHDL 2 05/21/2022 1027   VLDL 8.0 05/21/2022  1027   LDLCALC 68 05/05/2023 0823   LDLCALC 68 04/01/2020 1531    Physical Exam:    VS:  BP (!) 142/76 (BP Location: Right Arm, Patient Position: Sitting)   Pulse 78   Ht 6' (1.829 m)   Wt 212 lb (96.2 kg)   SpO2 94%   BMI 28.75 kg/m     Wt Readings from Last 3 Encounters:  07/30/23 212 lb (96.2 kg)  06/01/23 207 lb 6 oz (94.1 kg)  05/24/23 202 lb (91.6 kg)     GEN:  Well nourished, well developed in no acute distress HEENT: Normal NECK: No JVD; No carotid bruits LYMPHATICS: No lymphadenopathy CARDIAC:  RRR, no murmurs, no rubs, no gallops RESPIRATORY:  Clear to auscultation without rales, wheezing or rhonchi  ABDOMEN: Soft, non-tender, non-distended MUSCULOSKELETAL:  No edema; No deformity  SKIN: Warm and dry LOWER EXTREMITIES: no swelling NEUROLOGIC:  Alert and oriented x 3 PSYCHIATRIC:  Normal affect   ASSESSMENT:    1. Paroxysmal atrial fibrillation (HCC)   2. Coronary artery disease involving native coronary artery of native heart without angina pectoris   3. Centrilobular emphysema (HCC)   4. Dyslipidemia    PLAN:    In order of problems listed above:  Coronary disease stable from that point review denies of any signs and symptoms That would indicate reactivation of the problem. Paroxysmal atrial fibrillation denies having a palpitation. COPD present, still small will have a long discussion with him about regarding quitting smoking. Dyslipidemia I did review K PN LDL 68 HDL 64" medication continue present management   Medication Adjustments/Labs and Tests Ordered: Current medicines are reviewed at length with the patient today.  Concerns regarding medicines are outlined above.  No orders of the defined types were placed in this encounter.  Medication changes: No orders of the defined types were placed in this encounter.   Signed, Georgeanna Lea, MD, [facility] 07/30/2023 9:52 AM    [facility]

## 2023-07-30 NOTE — Patient Instructions (Signed)
 Medication Instructions:  Your physician recommends that you continue on your current medications as directed. Please refer to the Current Medication list given to you today.  *If you need a refill on your cardiac medications before your next appointment, please call your pharmacy*   Lab Work: None Ordered If you have labs (blood work) drawn today and your tests are completely normal, you will receive your results only by: MyChart Message (if you have MyChart) OR A paper copy in the mail If you have any lab test that is abnormal or we need to change your treatment, we will call you to review the results.   Testing/Procedures: None Ordered   Follow-Up: At [facility], you and your health needs are our priority.  As part of our continuing mission to provide you with exceptional heart care, we have created designated Provider Care Teams.  These Care Teams include your primary Cardiologist (physician) and Advanced Practice Providers (APPs -  Physician Assistants and Nurse Practitioners) who all work together to provide you with the care you need, when you need it.  We recommend signing up for the patient portal called "MyChart".  Sign up information is provided on this After Visit Summary.  MyChart is used to connect with patients for Virtual Visits (Telemedicine).  Patients are able to view lab/test results, encounter notes, upcoming appointments, etc.  Non-urgent messages can be sent to your provider as well.   To learn more about what you can do with MyChart, go to [email or web address].    Your next appointment:   6 month follow up

## 2023-08-26 DIAGNOSIS — K080 Exfoliation of teeth due to systemic causes: Secondary | ICD-10-CM | POA: Diagnosis not present

## 2023-10-19 DIAGNOSIS — K080 Exfoliation of teeth due to systemic causes: Secondary | ICD-10-CM | POA: Diagnosis not present

## 2023-10-28 ENCOUNTER — Ambulatory Visit (INDEPENDENT_AMBULATORY_CARE_PROVIDER_SITE_OTHER)

## 2023-10-28 VITALS — Ht 72.0 in | Wt 212.0 lb

## 2023-10-28 DIAGNOSIS — Z0000 Encounter for general adult medical examination without abnormal findings: Secondary | ICD-10-CM | POA: Diagnosis not present

## 2023-10-28 NOTE — Progress Notes (Addendum)
 Subjective:   Lawrence Rivas is a 73 y.o. who presents for a Medicare Wellness preventive visit.  Visit Complete: Virtual I connected with  Denice Bors on 10/28/23 by a audio enabled telemedicine application and verified that I am speaking with the correct person using two identifiers.  Patient Location: Home  Provider Location: Home Office  I discussed the limitations of evaluation and management by telemedicine. The patient expressed understanding and agreed to proceed.  Vital Signs: Because this visit was a virtual/telehealth visit, some criteria may be missing or patient reported. Any vitals not documented were not able to be obtained and vitals that have been documented are patient reported.    Persons Participating in Visit: Patient.  AWV Questionnaire: No: Patient Medicare AWV questionnaire was not completed prior to this visit.  Cardiac Risk Factors include: advanced age (>47men, >29 women);male gender;dyslipidemia;smoking/ tobacco exposure     Objective:    Today's Vitals   10/28/23 1123  Weight: 212 lb (96.2 kg)  Height: 6' (1.829 m)   Body mass index is 28.75 kg/m.     10/28/2023   11:33 AM 10/05/2022    9:00 AM 09/29/2021    9:22 AM 11/07/2019    5:58 AM  Advanced Directives  Does Patient Have a Medical Advance Directive? Yes Yes No Yes  Type of Estate agent of [place];Living will Healthcare Power of [place];Living will  Healthcare Power of [place];Living will  Does patient want to make changes to medical advance directive?    No - Patient declined  Copy of Healthcare Power of Attorney in Chart? No - copy requested No - copy requested  No - copy requested    Current Medications (verified) Outpatient Encounter Medications as of 10/28/2023  Medication Sig   aspirin EC 81 MG tablet Take 81 mg by mouth daily. Swallow whole.   atorvastatin (LIPITOR) 10 MG tablet Take 1 tablet (10 mg total) by mouth daily.   clobetasol ointment  (TEMOVATE) 0.05 % APPLY TO AFFECTED AREA AFTER BATHING. (GENERIC PLEASE) (Patient taking differently: Apply 1 Application topically See admin instructions. APPLY TO AFFECTED AREA AFTER BATHING. (GENERIC PLEASE))   diltiazem (CARDIZEM CD) 120 MG 24 hr capsule Take 1 capsule (120 mg total) by mouth daily.   fluticasone (FLONASE) 50 MCG/ACT nasal spray Place 1 spray into both nostrils daily as needed for allergies or rhinitis.   No facility-administered encounter medications on file as of 10/28/2023.    Allergies (verified) Watermelon [citrullus vulgaris] and Coconut (cocos nucifera)   History: Past Medical History:  Diagnosis Date   Adenomatous colon polyp    TA and SS   COPD (chronic obstructive pulmonary disease) (HCC)    Erectile dysfunction    Numbness and tingling    right arm    Paroxysmal A-fib (HCC)    no recurrence s/p atrial flutter ablation (previously atrial flutter felt to degenerate into afib)   Psoriasis    TIA (transient ischemic attack) 2004   Past Surgical History:  Procedure Laterality Date   A FLUTTER ABLATION     March 2011 at [facility] by Dr Ruben Reason Dixat   ATRIAL FIBRILLATION ABLATION N/A 11/07/2019   Procedure: ATRIAL FIBRILLATION ABLATION;  Surgeon: Hillis Range, MD;  [facility];  Service: Cardiovascular;  Laterality: N/A;   implantable loop recorder insertion  09/11/2019   Medtronic Reveal Linq model LNQ 22 (SN [id number]) implantable loop recorder implanted by Dr Johney Frame for afib management and evaluation of palpitations  Implantable loop recorder removal     MDT LINQ removal   TONSILLECTOMY     TOTAL HIP ARTHROPLASTY Right 2007   in [place]   Family History  Problem Relation Age of Onset   Stroke Mother        in her 53-90s   Uterine cancer Mother    Coronary artery disease Father 16   Diabetes Maternal Grandmother    Healthy Son    Colon cancer Neg Hx    Prostate cancer Neg Hx    Colon polyps Neg Hx     Esophageal cancer Neg Hx    Rectal cancer Neg Hx    Stomach cancer Neg Hx    Social History   Socioeconomic History   Marital status: Married    Spouse name: Not on file   Number of children: 1   Years of education: Not on file   Highest education level: Not on file  Occupational History   Occupation: Community education officer    Comment: [name]    Occupation: retired     Associate Professor: [employer]  Tobacco Use   Smoking status: Every Day    Current packs/day: 0.50    Average packs/day: 0.5 packs/day for 53.3 years (26.6 ttl pk-yrs)    Types: Cigarettes    Start date: 75   Smokeless tobacco: Never   Tobacco comments:    < 1/2 pack a day as of 05/2023  Vaping Use   Vaping status: Never Used  Substance and Sexual Activity   Alcohol use: Yes    Comment: 2 drinks of scotch per day   Drug use: No   Sexual activity: Not on file  Other Topics Concern   Not on file  Social History Narrative   Household: pt and wife     Social Drivers of Corporate investment banker Strain: Low Risk  (10/28/2023)   Overall Financial Resource Strain (CARDIA)    Difficulty of Paying Living Expenses: Not hard at all  Food Insecurity: No Food Insecurity (10/28/2023)   Hunger Vital Sign    Worried About Running Out of Food in the Last Year: Never true    Ran Out of Food in the Last Year: Never true  Transportation Needs: No Transportation Needs (10/28/2023)   PRAPARE - Administrator, Civil Service (Medical): No    Lack of Transportation (Non-Medical): No  Physical Activity: Sufficiently Active (10/28/2023)   Exercise Vital Sign    Days of Exercise per Week: 7 days    Minutes of Exercise per Session: 120 min  Stress: No Stress Concern Present (10/28/2023)   [name]    Feeling of Stress : Not at all  Social Connections: Moderately Isolated (10/28/2023)   Social Connection and Isolation Panel [NHANES]    Frequency of Communication  with Friends and Family: More than three times a week    Frequency of Social Gatherings with Friends and Family: More than three times a week    Attends Religious Services: Never    Database administrator or Organizations: No    Attends Engineer, structural: Never    Marital Status: Married    Tobacco Counseling Ready to quit: No Counseling given: Yes Tobacco comments: < 1/2 pack a day as of 05/2023    Clinical Intake:  Pre-visit preparation completed: Yes  Pain : No/denies pain     BMI - recorded: 28.5 Nutritional Status: BMI 25 -29  Overweight Nutritional Risks: None Diabetes: No  No results found for: "HGBA1C"   How often do you need to have someone help you when you read instructions, pamphlets, or other written materials from your doctor or pharmacy?: 1 - Never  Interpreter Needed?: No  Information entered by :: Farris Hong LPN   Activities of Daily Living     10/28/2023   11:31 AM  In your present state of health, do you have any difficulty performing the following activities:  Hearing? 0  Vision? 0  Difficulty concentrating or making decisions? 0  Walking or climbing stairs? 0  Dressing or bathing? 0  Doing errands, shopping? 0  Preparing Food and eating ? N  Using the Toilet? N  In the past six months, have you accidently leaked urine? N  Do you have problems with loss of bowel control? N  Managing your Medications? N  Managing your Finances? N  Housekeeping or managing your Housekeeping? N    Patient Care Team: Ezell Hollow, MD as PCP - General (Internal Medicine) Jolly Needle, MD (Inactive) as PCP - Cardiology (Cardiology) Ellard Gunning, MD as Consulting Physician (Orthopedic Surgery) Wilder Handy, MD (Inactive) as Consulting Physician (Pulmonary Disease) Dominic Friendly Cordelia Dessert, MD as Consulting Physician (Gastroenterology) Devon Fogo, MD (Inactive) as Consulting Physician (Dermatology)  Indicate any recent Medical Services you may  have received from other than [facility] providers in the past year (date may be approximate).     Assessment:   This is a routine wellness examination for [place].  Hearing/Vision screen Hearing Screening - Comments:: Denies hearing difficulties   Vision Screening - Comments:: Wears reading glasses -Not up to date with routine eye exams with  [facility].    Goals Addressed               This Visit's Progress     Lose weight (pt-stated)        Continue to walk daily.       Depression Screen      10/28/2023   11:29 AM 06/01/2023    9:43 AM 11/20/2022    9:18 AM 10/05/2022    9:07 AM 05/21/2022    9:22 AM 11/18/2021    9:12 AM 09/29/2021    9:30 AM  PHQ 2/9 Scores  PHQ - 2 Score 0 2 0 0 0 0 0  PHQ- 9 Score  7         Fall Risk     10/28/2023   11:31 AM 06/01/2023    9:21 AM 11/20/2022    9:18 AM 10/05/2022    9:02 AM 05/21/2022    9:21 AM  Fall Risk   Falls in the past year? 0 0 0 0 0  Number falls in past yr: 0 0 0 0 0  Injury with Fall? 0 0 0 0 0  Risk for fall due to : No Fall Risks   No Fall Risks   Follow up Falls prevention discussed;Falls evaluation completed Falls evaluation completed Falls evaluation completed Falls evaluation completed     MEDICARE RISK AT HOME:  Medicare Risk at Home Any stairs in or around the home?: Yes If so, are there any without handrails?: No Home free of loose throw rugs in walkways, pet beds, electrical cords, etc?: Yes Adequate lighting in your home to reduce risk of falls?: Yes Life alert?: No Use of a cane, walker or w/c?: No Grab bars in the bathroom?: No Shower chair or bench in shower?: Yes  Elevated toilet seat or a handicapped toilet?: No  TIMED UP AND GO:  Was the test performed?  No  Cognitive Function: 6CIT completed        10/28/2023   11:32 AM 10/05/2022    9:17 AM  6CIT Screen  What Year? 0 points 0 points  What month? 0 points 0 points  What time? 0 points 0 points  Count back from 20 0 points 0 points   Months in reverse 0 points 0 points  Repeat phrase 0 points 0 points  Total Score 0 points 0 points    Immunizations Immunization History  Administered Date(s) Administered   Fluad Quad(high Dose 65+) 04/01/2020, 04/28/2021   Fluad Trivalent(High Dose 65+) 03/31/2023   Influenza Split 05/01/2014   Influenza,inj,Quad PF,6+ Mos 06/05/2013   Influenza,inj,quad, With Preservative 04/13/2019   Influenza-Unspecified 05/17/2016, 04/11/2022   PFIZER(Purple Top)SARS-COV-2 Vaccination 07/29/2019, 08/19/2019, 10/22/2020   Pfizer Covid-19 Vaccine Bivalent Booster 56yrs & up 05/26/2021, 04/11/2022   Pfizer(Comirnaty)Fall Seasonal Vaccine 12 years and older 04/13/2023   Pneumococcal Conjugate-13 04/01/2020   Pneumococcal Polysaccharide-23 06/18/2011   RSV,unspecified 04/11/2022   Tdap 06/18/2011, 05/26/2021   Zoster, Live 06/05/2013    Screening Tests Health Maintenance  Topic Date Due   Zoster Vaccines- Shingrix (1 of 2) 04/30/1969   COVID-19 Vaccine (7 - Pfizer risk 2024-25 season) 10/12/2023   Lung Cancer Screening  01/07/2024   INFLUENZA VACCINE  02/11/2024   Colonoscopy  08/28/2024   Medicare Annual Wellness (AWV)  10/27/2024   Pneumonia Vaccine 98+ Years old (3 of 3 - PCV20 or PCV21) 04/01/2025   DTaP/Tdap/Td (3 - Td or Tdap) 05/27/2031   Hepatitis C Screening  Completed   HPV VACCINES  Aged Out   Meningococcal B Vaccine  Aged Out    Health Maintenance  Health Maintenance Due  Topic Date Due   Zoster Vaccines- Shingrix (1 of 2) 04/30/1969   COVID-19 Vaccine (7 - Pfizer risk 2024-25 season) 10/12/2023   Health Maintenance Items Addressed:    Additional Screening:  Vision Screening: Recommended annual ophthalmology exams for early detection of glaucoma and other disorders of the eye.  Dental Screening: Recommended annual dental exams for proper oral hygiene  Community Resource Referral / Chronic Care Management: CRR required this visit?  No   CCM required this  visit?  No     Plan:     I have personally reviewed and noted the following in the patient's chart:   Medical and social history Use of alcohol, tobacco or illicit drugs  Current medications and supplements including opioid prescriptions. Patient is not currently taking opioid prescriptions. Functional ability and status Nutritional status Physical activity Advanced directives List of other physicians Hospitalizations, surgeries, and ER visits in previous 12 months Vitals Screenings to include cognitive, depression, and falls Referrals and appointments  In addition, I have reviewed and discussed with patient certain preventive protocols, quality metrics, and best practice recommendations. A written personalized care plan for preventive services as well as general preventive health recommendations were provided to patient.     Tillie Rung, LPN   1/61/0960   After Visit Summary: (MyChart) Due to this being a telephonic visit, the after visit summary with patients personalized plan was offered to patient via MyChart   Notes: Nothing significant to report at this time.

## 2023-10-28 NOTE — Patient Instructions (Addendum)
 Mr. Lawrence Rivas , Thank you for taking time to come for your Medicare Wellness Visit. I appreciate your ongoing commitment to your health goals. Please review the following plan we discussed and let me know if I can assist you in the future.   Referrals/Orders/Follow-Ups/Clinician Recommendations:   This is a list of the screening recommended for you and due dates:  Health Maintenance  Topic Date Due   Zoster (Shingles) Vaccine (1 of 2) 04/30/1969   COVID-19 Vaccine (7 - Pfizer risk 2024-25 season) 10/12/2023   Screening for Lung Cancer  01/07/2024   Flu Shot  02/11/2024   Colon Cancer Screening  08/28/2024   Medicare Annual Wellness Visit  10/27/2024   Pneumonia Vaccine (3 of 3 - PCV20 or PCV21) 04/01/2025   DTaP/Tdap/Td vaccine (3 - Td or Tdap) 05/27/2031   Hepatitis C Screening  Completed   HPV Vaccine  Aged Out   Meningitis B Vaccine  Aged Out    Advanced directives: (Copy Requested) Please bring a copy of your health care power of attorney and living will to the office to be added to your chart at your convenience. You can mail to [facility] [address] [place], [place] [address] or email to [email or web address]  Next Medicare Annual Wellness Visit scheduled for next year: Yes

## 2023-11-29 ENCOUNTER — Ambulatory Visit: Payer: Medicare Other | Admitting: Internal Medicine

## 2023-11-29 VITALS — BP 138/74 | HR 82 | Temp 97.9°F | Resp 18 | Ht 72.0 in | Wt 211.6 lb

## 2023-11-29 DIAGNOSIS — Z23 Encounter for immunization: Secondary | ICD-10-CM | POA: Diagnosis not present

## 2023-11-29 DIAGNOSIS — J432 Centrilobular emphysema: Secondary | ICD-10-CM

## 2023-11-29 DIAGNOSIS — I480 Paroxysmal atrial fibrillation: Secondary | ICD-10-CM | POA: Diagnosis not present

## 2023-11-29 DIAGNOSIS — Z720 Tobacco use: Secondary | ICD-10-CM

## 2023-11-29 NOTE — Progress Notes (Signed)
   Subjective:    Patient ID: Lawrence Rivas, male    DOB: [birth year], 73 y.o.   MRN: [id number]  DOS:  11/29/2023 Type of visit - description: Routine office visit  No new concerns or symptoms. Chronic medical problems addressed.   Review of Systems See above   Past Medical History:  Diagnosis Date   Adenomatous colon polyp    TA and SS   COPD (chronic obstructive pulmonary disease) (HCC)    Erectile dysfunction    Numbness and tingling    right arm    Paroxysmal A-fib (HCC)    no recurrence s/p atrial flutter ablation (previously atrial flutter felt to degenerate into afib)   Psoriasis    TIA (transient ischemic attack) 2004    Past Surgical History:  Procedure Laterality Date   A FLUTTER ABLATION     March 2011 at [facility] by Dr Lucinda Saber Dixat   ATRIAL FIBRILLATION ABLATION N/A 11/07/2019   Procedure: ATRIAL FIBRILLATION ABLATION;  Surgeon: Jolly Needle, MD;  [facility];  Service: Cardiovascular;  Laterality: N/A;   implantable loop recorder insertion  09/11/2019   Medtronic Reveal Linq model LNQ 22 (SN [id number]) implantable loop recorder implanted by Dr Nunzio Belch for afib management and evaluation of palpitations   Implantable loop recorder removal     MDT LINQ removal   TONSILLECTOMY     TOTAL HIP ARTHROPLASTY Right 2007   in [place]     Current Outpatient Medications  Medication Instructions   aspirin EC 81 mg, Daily   atorvastatin  (LIPITOR) 10 mg, Oral, Daily   clobetasol  ointment (TEMOVATE ) 0.05 % APPLY TO AFFECTED AREA AFTER BATHING. (GENERIC PLEASE)   diltiazem  (CARDIZEM  CD) 120 mg, Oral, Daily   fluticasone  (FLONASE ) 50 MCG/ACT nasal spray 1 spray, Each Nare, Daily PRN       Objective:   Physical Exam BP 138/74   Pulse 82   Temp 97.9 F (36.6 C)   Resp 18   Ht 6' (1.829 m)   Wt 211 lb 9.6 oz (96 kg)   SpO2 97%   BMI 28.70 kg/m  General:   Well developed, NAD, BMI noted. HEENT:  Normocephalic . Face  symmetric, atraumatic Lungs:  CTA B Normal respiratory effort, no intercostal retractions, no accessory muscle use. Heart: RRR,  no murmur.  Lower extremities: no pretibial edema bilaterally  Skin: Not pale. Not jaundice Neurologic:  alert & oriented X3.  Speech normal, gait appropriate for age and unassisted Psych--  Cognition and judgment appear intact.  Cooperative with normal attention span and concentration.  Behavior appropriate. No anxious or depressed appearing.      Assessment    ASSESSMENT CV: ---P- Atrial fibrillation: Ablation remotely, sxs again 2021, loop implantation,  started Xarelto  09-2019, ablation April 2021.  Xarelto  DC 02-2020.  Loop recorder explanted 02-2022. --- CAD per CT coronary angio 12/2022 Dyslipidemia  Tobacco abuse Erectile dysfunction TIA 2004   Pulmonary: Emphysema per CT 01-2021, interstitial lung disease dx ~  08/2021, R lung nodule H/o psoriasis (used to see derm) MCV increased: WNL B12- folic acid   PLAN Emphysema, lung nodules, smoker: Due for LDCT 12/2023  Tobacco abuse: Unchanged, states he will not quit. CAD, history of A-fib: Cardiology OV 07/30/2023, no changes made Dyslipidemia: On atorvastatin , last LDL 64.  No change Preventive care: PNM 20 today. RTC November 2025 CPX

## 2023-11-29 NOTE — Patient Instructions (Signed)
 Continue same medicines    Next office visit for a physical exam, November 2025 Please make an appointment before you leave today

## 2023-11-29 NOTE — Assessment & Plan Note (Signed)
 Emphysema, lung nodules, smoker: Due for LDCT 12/2023  Tobacco abuse: Unchanged, states he will not quit. CAD, history of A-fib: Cardiology OV 07/30/2023, no changes made Dyslipidemia: On atorvastatin , last LDL 64.  No change Preventive care: PNM 20 today. RTC November 2025 CPX

## 2023-12-01 ENCOUNTER — Encounter: Payer: Self-pay | Admitting: Internal Medicine

## 2023-12-21 DIAGNOSIS — M722 Plantar fascial fibromatosis: Secondary | ICD-10-CM | POA: Diagnosis not present

## 2024-01-07 DIAGNOSIS — M722 Plantar fascial fibromatosis: Secondary | ICD-10-CM | POA: Diagnosis not present

## 2024-01-31 ENCOUNTER — Other Ambulatory Visit: Payer: Self-pay

## 2024-01-31 DIAGNOSIS — F17200 Nicotine dependence, unspecified, uncomplicated: Secondary | ICD-10-CM

## 2024-02-02 DIAGNOSIS — Z96641 Presence of right artificial hip joint: Secondary | ICD-10-CM | POA: Diagnosis not present

## 2024-02-02 DIAGNOSIS — Z471 Aftercare following joint replacement surgery: Secondary | ICD-10-CM | POA: Diagnosis not present

## 2024-02-09 ENCOUNTER — Other Ambulatory Visit: Payer: Self-pay | Admitting: Cardiology

## 2024-03-16 DIAGNOSIS — Z96641 Presence of right artificial hip joint: Secondary | ICD-10-CM | POA: Diagnosis not present

## 2024-03-16 DIAGNOSIS — M25551 Pain in right hip: Secondary | ICD-10-CM | POA: Diagnosis not present

## 2024-03-23 ENCOUNTER — Telehealth: Payer: Self-pay

## 2024-03-23 ENCOUNTER — Other Ambulatory Visit: Payer: Self-pay | Admitting: Cardiology

## 2024-03-23 MED ORDER — COVID-19 MRNA VAC-TRIS(PFIZER) 30 MCG/0.3ML IM SUSY: 0.3000 mL | PREFILLED_SYRINGE | Freq: Once | INTRAMUSCULAR | 0 refills | Status: AC

## 2024-03-23 NOTE — Telephone Encounter (Signed)
 Copied from CRM #[id number]. Topic: General - Other >> Mar 23, 2024  2:40 PM Chiquita SQUIBB wrote: Reason for CRM: Patient is calling in requesting a prescription for the COVID vaccine, patient would like it to the [facility] on [address] Please advise the patient.

## 2024-03-23 NOTE — Addendum Note (Signed)
 Addended by: Navarro Nine D on: 03/23/2024 04:17 PM   Modules accepted: Orders

## 2024-04-02 DIAGNOSIS — M25551 Pain in right hip: Secondary | ICD-10-CM | POA: Diagnosis not present

## 2024-04-14 DIAGNOSIS — Z96641 Presence of right artificial hip joint: Secondary | ICD-10-CM | POA: Diagnosis not present

## 2024-04-14 DIAGNOSIS — Z471 Aftercare following joint replacement surgery: Secondary | ICD-10-CM | POA: Diagnosis not present

## 2024-04-24 DIAGNOSIS — K080 Exfoliation of teeth due to systemic causes: Secondary | ICD-10-CM | POA: Diagnosis not present

## 2024-05-01 ENCOUNTER — Other Ambulatory Visit: Payer: Self-pay

## 2024-05-01 DIAGNOSIS — F17200 Nicotine dependence, unspecified, uncomplicated: Secondary | ICD-10-CM

## 2024-06-05 ENCOUNTER — Ambulatory Visit: Admitting: Internal Medicine

## 2024-07-24 ENCOUNTER — Encounter: Payer: Self-pay | Admitting: Internal Medicine

## 2024-07-24 ENCOUNTER — Ambulatory Visit (INDEPENDENT_AMBULATORY_CARE_PROVIDER_SITE_OTHER): Admitting: Internal Medicine

## 2024-07-24 VITALS — BP 134/66 | HR 85 | Temp 98.1°F | Resp 16 | Ht 72.0 in | Wt 214.2 lb

## 2024-07-24 DIAGNOSIS — R0609 Other forms of dyspnea: Secondary | ICD-10-CM

## 2024-07-24 DIAGNOSIS — E785 Hyperlipidemia, unspecified: Secondary | ICD-10-CM

## 2024-07-24 DIAGNOSIS — R739 Hyperglycemia, unspecified: Secondary | ICD-10-CM | POA: Diagnosis not present

## 2024-07-24 DIAGNOSIS — Z0001 Encounter for general adult medical examination with abnormal findings: Secondary | ICD-10-CM | POA: Diagnosis not present

## 2024-07-24 DIAGNOSIS — R399 Unspecified symptoms and signs involving the genitourinary system: Secondary | ICD-10-CM

## 2024-07-24 DIAGNOSIS — I2510 Atherosclerotic heart disease of native coronary artery without angina pectoris: Secondary | ICD-10-CM

## 2024-07-24 DIAGNOSIS — Z1211 Encounter for screening for malignant neoplasm of colon: Secondary | ICD-10-CM

## 2024-07-24 DIAGNOSIS — J439 Emphysema, unspecified: Secondary | ICD-10-CM | POA: Diagnosis not present

## 2024-07-24 DIAGNOSIS — I872 Venous insufficiency (chronic) (peripheral): Secondary | ICD-10-CM

## 2024-07-24 DIAGNOSIS — I480 Paroxysmal atrial fibrillation: Secondary | ICD-10-CM

## 2024-07-24 DIAGNOSIS — Z0000 Encounter for general adult medical examination without abnormal findings: Secondary | ICD-10-CM

## 2024-07-24 DIAGNOSIS — F17200 Nicotine dependence, unspecified, uncomplicated: Secondary | ICD-10-CM | POA: Diagnosis not present

## 2024-07-24 MED ORDER — CLOBETASOL PROPIONATE 0.05 % EX OINT: 1.0000 | TOPICAL_OINTMENT | Freq: Every day | CUTANEOUS | 1 refills | Status: AC | PRN

## 2024-07-24 NOTE — Progress Notes (Unsigned)
 "  Subjective:    Patient ID: Lawrence Rivas, male    DOB: May 15, 1951, 74 y.o.   MRN: [id number]  DOS:  07/24/2024 CPX Here for CPX Multiple symptoms. For the last 2 years had noted DOE, he can only walk on flat surfaces.  He denies specifically chest pain, palpitations, cough.  LUTS: Having symptom for 5 to 6 years --- Very slow flow, starts and stops, takes 3-4 times to completely empty my bladder.  Denies dysuria or gross hematuria.  MSK: Having pain for a while, mostly knees and hips. No claudication per se, minor back pains.  Has noticed some decreased balance but no falls.  Review of Systems  Other than above, a 14 point review of systems is negative     Past Medical History:  Diagnosis Date   Adenomatous colon polyp    TA and SS   COPD (chronic obstructive pulmonary disease) (HCC)    Erectile dysfunction    Numbness and tingling    right arm    Paroxysmal A-fib (HCC)    no recurrence s/p atrial flutter ablation (previously atrial flutter felt to degenerate into afib)   Psoriasis    TIA (transient ischemic attack) 2004    Past Surgical History:  Procedure Laterality Date   A FLUTTER ABLATION     March 2011 at [facility] by Dr Ripley Dixat   ATRIAL FIBRILLATION ABLATION N/A 11/07/2019   Procedure: ATRIAL FIBRILLATION ABLATION;  Surgeon: Kelsie Agent, MD;  [facility];  Service: Cardiovascular;  Laterality: N/A;   implantable loop recorder insertion  09/11/2019   Medtronic Reveal Linq model LNQ 22 (SN [id number]) implantable loop recorder implanted by Dr Kelsie for afib management and evaluation of palpitations   Implantable loop recorder removal     MDT LINQ removal   TONSILLECTOMY     TOTAL HIP ARTHROPLASTY Right 2007   in [place]     Current Outpatient Medications  Medication Instructions   aspirin EC 81 mg, Daily   atorvastatin  (LIPITOR) 10 MG tablet TAKE 1 TABLET(10 MG) BY MOUTH DAILY   clobetasol  ointment (TEMOVATE )  0.05 % 1 Application, Topical, Daily PRN   diltiazem  (CARDIZEM  CD) 120 MG 24 hr capsule TAKE 1 CAPSULE(120 MG) BY MOUTH DAILY   fluticasone  (FLONASE ) 50 MCG/ACT nasal spray 1 spray, Each Nare, Daily PRN       Objective:   Physical Exam BP 134/66   Pulse 85   Temp 98.1 F (36.7 C) (Oral)   Resp 16   Ht 6' (1.829 m)   Wt 214 lb 4 oz (97.2 kg)   SpO2 98%   BMI 29.06 kg/m  General: Well developed, NAD, BMI noted Neck: No  thyromegaly  HEENT:  Normocephalic . Face symmetric, atraumatic Lungs:  CTA B Normal respiratory effort, no intercostal retractions, no accessory muscle use. Heart: RRR,  no murmur.  Abdomen:  Not distended, soft, non-tender. No rebound or rigidity.   Lower extremities: no pretibial edema bilaterally.  Good femoral and pedal pulses Skin: Exposed areas without rash. Not pale. Not jaundice Neurologic:  alert & oriented X3.  Speech normal, gait appropriate for age and unassisted Strength symmetric and appropriate for age.  Psych: Cognition and judgment appear intact.  Cooperative with normal attention span and concentration.  Behavior appropriate. No anxious or depressed appearing.     Assessment     ASSESSMENT CV: ---P- Atrial fibrillation: Ablation remotely, sxs again 2021, loop implantation,  started Xarelto  09-2019, ablation April 2021.  Xarelto  DC 02-2020.  Loop recorder explanted 02-2022. --- CAD per CT coronary angio 12/2022 Dyslipidemia  Tobacco abuse Erectile dysfunction TIA 2004   Pulmonary: Emphysema per CT 01-2021, interstitial lung disease dx ~  08/2021, R lung nodule H/o psoriasis (used to see derm) MCV increased: WNL B12- folic acid   PLAN Here for CPX Td 2022 Zoster 2014  PNM : 23 2012; PNM 13: 2021; PNM 20 2025 RSV done per patient Had a flu shot,and a COVID-vaccine. Rec: shingrix   CCS:  Reports a Cscope 2009, elsewhere, benign polyps, cscope 02/2020, multiple polyps. C-scope 08/2021, 1 polyp, next 3 years per GI.  Dr. Legrand,  referral sent  Prostate ca screening: See comments under LUTS Lung cancer screening: Ordered LDCT Tobacco: Still smokes, not ready to quit. Labs: CMP FLP CBC TSH PSA A1C UA urine culture  Other issues: Dyslipidemia: On atorvastatin , checking labs History of CAD per CT coronary angiography 12/2022.   Complains of DOE for the last 2 years, he is avoiding to walk uphill.  No chest pain, no palpitations.  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 DOE, angina equivalent?. Plan: Ask cardiology to eval DJD: Mostly knees and hips.  Reassess on RTC LUTS: Today he reports multiple years history of LUTS as described above, declines a DRE, check PSA UA urine culture.  Refer to urology. Emphysema, lung nodules, smoker: Rx LDCT Tobacco, EtOH: Unchanged RTC 6 months    "

## 2024-07-24 NOTE — Patient Instructions (Addendum)
 Please read your instructions carefully.  GO TO THE LAB :  Get the blood work    Go to the front desk for the checkout Please make an appointment for a checkup in 6 months  Referrals: Cardiology: You are having difficulty breathing.  Will ask them to assess your heart Urology: Difficulty urinating, likely from large prostate Arranging for a CAT scan of your lungs  Tobacco: Consider call [phone] NOW

## 2024-07-25 ENCOUNTER — Encounter: Payer: Self-pay | Admitting: Internal Medicine

## 2024-07-25 LAB — COMPREHENSIVE METABOLIC PANEL WITH GFR
ALT: 23 U/L (ref 3–53)
AST: 23 U/L (ref 5–37)
Albumin: 3.8 g/dL (ref 3.5–5.2)
Alkaline Phosphatase: 65 U/L (ref 39–117)
BUN: 19 mg/dL (ref 6–23)
CO2: 26 meq/L (ref 19–32)
Calcium: 8.7 mg/dL (ref 8.4–10.5)
Chloride: 101 meq/L (ref 96–112)
Creatinine, Ser: 0.82 mg/dL (ref 0.40–1.50)
GFR: 86.7 mL/min
Glucose, Bld: 126 mg/dL — ABNORMAL HIGH (ref 70–99)
Potassium: 4.1 meq/L (ref 3.5–5.1)
Sodium: 135 meq/L (ref 135–145)
Total Bilirubin: 0.4 mg/dL (ref 0.2–1.2)
Total Protein: 6.8 g/dL (ref 6.0–8.3)

## 2024-07-25 LAB — URINALYSIS, ROUTINE W REFLEX MICROSCOPIC
Bilirubin Urine: NEGATIVE
Hgb urine dipstick: NEGATIVE
Leukocytes,Ua: NEGATIVE
Nitrite: NEGATIVE
Specific Gravity, Urine: 1.02 (ref 1.000–1.030)
Total Protein, Urine: NEGATIVE
Urine Glucose: NEGATIVE
Urobilinogen, UA: 0.2 (ref 0.0–1.0)
pH: 5.5 (ref 5.0–8.0)

## 2024-07-25 LAB — LIPID PANEL
Cholesterol: 126 mg/dL (ref 28–200)
HDL: 64 mg/dL
LDL Cholesterol: 36 mg/dL (ref 10–99)
NonHDL: 62.2
Total CHOL/HDL Ratio: 2
Triglycerides: 130 mg/dL (ref 10.0–149.0)
VLDL: 26 mg/dL (ref 0.0–40.0)

## 2024-07-25 LAB — URINE CULTURE
MICRO NUMBER:: 17456085
Result:: NO GROWTH
SPECIMEN QUALITY:: ADEQUATE

## 2024-07-25 LAB — CBC WITH DIFFERENTIAL/PLATELET
Basophils Absolute: 0.1 K/uL (ref 0.0–0.1)
Basophils Relative: 1 % (ref 0.0–3.0)
Eosinophils Absolute: 0.1 K/uL (ref 0.0–0.7)
Eosinophils Relative: 2.4 % (ref 0.0–5.0)
HCT: 40.8 % (ref 39.0–52.0)
Hemoglobin: 14 g/dL (ref 13.0–17.0)
Lymphocytes Relative: 31.5 % (ref 12.0–46.0)
Lymphs Abs: 2 K/uL (ref 0.7–4.0)
MCHC: 34.3 g/dL (ref 30.0–36.0)
MCV: 99.4 fl (ref 78.0–100.0)
Monocytes Absolute: 0.7 K/uL (ref 0.1–1.0)
Monocytes Relative: 10.8 % (ref 3.0–12.0)
Neutro Abs: 3.4 K/uL (ref 1.4–7.7)
Neutrophils Relative %: 54.3 % (ref 43.0–77.0)
Platelets: 203 K/uL (ref 150.0–400.0)
RBC: 4.11 Mil/uL — ABNORMAL LOW (ref 4.22–5.81)
RDW: 13.1 % (ref 11.5–15.5)
WBC: 6.2 K/uL (ref 4.0–10.5)

## 2024-07-25 LAB — TSH: TSH: 1.28 u[IU]/mL (ref 0.35–5.50)

## 2024-07-25 LAB — PSA: PSA: 0.35 ng/mL (ref 0.10–4.00)

## 2024-07-25 LAB — HEMOGLOBIN A1C: Hgb A1c MFr Bld: 6 % (ref 4.6–6.5)

## 2024-07-25 NOTE — Assessment & Plan Note (Signed)
 Here for CPX Td 2022 Zoster 2014  PNM : 23 2012; PNM 13: 2021; PNM 20 2025 RSV done per patient Had a flu shot,and a COVID-vaccine. Rec: shingrix   CCS:  Reports a Cscope 2009, elsewhere, benign polyps, cscope 02/2020, multiple polyps. C-scope 08/2021, 1 polyp, next 3 years per GI.  Dr. Legrand, referral sent  Prostate ca screening: See comments under LUTS Lung cancer screening: Ordered LDCT Tobacco: Still smokes, not ready to quit. Labs: CMP FLP CBC TSH PSA A1C UA urine culture

## 2024-07-25 NOTE — Assessment & Plan Note (Signed)
 Here for CPX  Other issues: Dyslipidemia: On atorvastatin , checking labs History of CAD per CT coronary angiography 12/2022.   Complains of DOE for the last 2 years, he is avoiding to walk uphill.  No chest pain, no palpitations.  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 DOE, angina equivalent?. Plan: Ask cardiology to eval DJD: Mostly knees and hips.  Reassess on RTC LUTS: Today he reports multiple years history of LUTS as described above, declines a DRE, check PSA UA urine culture.  Refer to urology. Emphysema, lung nodules, smoker: Rx LDCT Tobacco, EtOH: Unchanged RTC 6 months

## 2024-07-27 ENCOUNTER — Ambulatory Visit: Payer: Self-pay | Admitting: Internal Medicine

## 2024-08-25 ENCOUNTER — Ambulatory Visit: Admitting: Urology

## 2024-09-26 ENCOUNTER — Ambulatory Visit: Admitting: Cardiology

## 2024-11-02 ENCOUNTER — Ambulatory Visit

## 2025-01-22 ENCOUNTER — Ambulatory Visit: Admitting: Internal Medicine
